# Patient Record
Sex: Female | Born: 1960 | Race: White | Hispanic: No | Marital: Married | Smoking: Never smoker
Health system: Southern US, Community
[De-identification: ages and names within clinical notes are randomized; demographics above are authoritative.]

## PROBLEM LIST (undated history)

## (undated) ENCOUNTER — Emergency Department (HOSPITAL_COMMUNITY): Payer: 59

## (undated) DIAGNOSIS — M797 Fibromyalgia: Secondary | ICD-10-CM

## (undated) DIAGNOSIS — K7460 Unspecified cirrhosis of liver: Secondary | ICD-10-CM

## (undated) DIAGNOSIS — N938 Other specified abnormal uterine and vaginal bleeding: Secondary | ICD-10-CM

## (undated) DIAGNOSIS — I7300 Raynaud's syndrome without gangrene: Secondary | ICD-10-CM

## (undated) DIAGNOSIS — H04121 Dry eye syndrome of right lacrimal gland: Secondary | ICD-10-CM

## (undated) DIAGNOSIS — R682 Dry mouth, unspecified: Secondary | ICD-10-CM

## (undated) DIAGNOSIS — M810 Age-related osteoporosis without current pathological fracture: Secondary | ICD-10-CM

## (undated) DIAGNOSIS — R160 Hepatomegaly, not elsewhere classified: Secondary | ICD-10-CM

## (undated) HISTORY — DX: Fibromyalgia: M79.7

## (undated) HISTORY — DX: Other specified abnormal uterine and vaginal bleeding: N93.8

## (undated) HISTORY — DX: Unspecified cirrhosis of liver: K74.60

## (undated) HISTORY — PX: SALPINGECTOMY: SHX328

## (undated) HISTORY — DX: Dry mouth, unspecified: R68.2

## (undated) HISTORY — DX: Hepatomegaly, not elsewhere classified: R16.0

## (undated) HISTORY — DX: Raynaud's syndrome without gangrene: I73.00

## (undated) HISTORY — DX: Dry eye syndrome of right lacrimal gland: H04.121

---
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
## 1997-08-30 ENCOUNTER — Other Ambulatory Visit: Admission: RE | Admit: 1997-08-30 | Discharge: 1997-08-30 | Payer: Self-pay | Admitting: Gynecology

## 1998-08-28 ENCOUNTER — Other Ambulatory Visit: Admission: RE | Admit: 1998-08-28 | Discharge: 1998-08-28 | Payer: Self-pay | Admitting: Gynecology

## 1999-09-23 ENCOUNTER — Other Ambulatory Visit: Admission: RE | Admit: 1999-09-23 | Discharge: 1999-09-23 | Payer: Self-pay | Admitting: Gynecology

## 2000-05-06 ENCOUNTER — Other Ambulatory Visit: Admission: RE | Admit: 2000-05-06 | Discharge: 2000-05-06 | Payer: Self-pay | Admitting: Gynecology

## 2000-09-23 ENCOUNTER — Other Ambulatory Visit: Admission: RE | Admit: 2000-09-23 | Discharge: 2000-09-23 | Payer: Self-pay | Admitting: Gynecology

## 2001-09-22 ENCOUNTER — Other Ambulatory Visit: Admission: RE | Admit: 2001-09-22 | Discharge: 2001-09-22 | Payer: Self-pay | Admitting: Gynecology

## 2002-02-06 ENCOUNTER — Encounter
Admission: RE | Admit: 2002-02-06 | Discharge: 2002-04-05 | Payer: Self-pay | Admitting: Physical Medicine & Rehabilitation

## 2002-04-06 ENCOUNTER — Encounter
Admission: RE | Admit: 2002-04-06 | Discharge: 2002-05-16 | Payer: Self-pay | Admitting: Physical Medicine & Rehabilitation

## 2002-09-07 ENCOUNTER — Other Ambulatory Visit: Admission: RE | Admit: 2002-09-07 | Discharge: 2002-09-07 | Payer: Self-pay | Admitting: Gynecology

## 2002-10-23 ENCOUNTER — Encounter
Admission: RE | Admit: 2002-10-23 | Discharge: 2003-01-21 | Payer: Self-pay | Admitting: Physical Medicine & Rehabilitation

## 2003-09-20 ENCOUNTER — Other Ambulatory Visit: Admission: RE | Admit: 2003-09-20 | Discharge: 2003-09-20 | Payer: Self-pay | Admitting: Gynecology

## 2004-09-17 ENCOUNTER — Other Ambulatory Visit: Admission: RE | Admit: 2004-09-17 | Discharge: 2004-09-17 | Payer: Self-pay | Admitting: Gynecology

## 2005-09-21 ENCOUNTER — Other Ambulatory Visit: Admission: RE | Admit: 2005-09-21 | Discharge: 2005-09-21 | Payer: Self-pay | Admitting: Gynecology

## 2006-09-30 ENCOUNTER — Other Ambulatory Visit: Admission: RE | Admit: 2006-09-30 | Discharge: 2006-09-30 | Payer: Self-pay | Admitting: Gynecology

## 2006-10-20 ENCOUNTER — Encounter: Admission: RE | Admit: 2006-10-20 | Discharge: 2006-10-20 | Payer: Self-pay | Admitting: Gynecology

## 2007-09-21 ENCOUNTER — Other Ambulatory Visit: Admission: RE | Admit: 2007-09-21 | Discharge: 2007-09-21 | Payer: Self-pay | Admitting: Gynecology

## 2011-09-06 ENCOUNTER — Other Ambulatory Visit: Payer: Self-pay | Admitting: Obstetrics and Gynecology

## 2011-09-08 NOTE — Telephone Encounter (Signed)
Camila refilled 1 pk with 1 rf.  AEX sch for July.  ld

## 2011-09-10 ENCOUNTER — Ambulatory Visit (INDEPENDENT_AMBULATORY_CARE_PROVIDER_SITE_OTHER): Payer: 59 | Admitting: Family Medicine

## 2011-09-10 ENCOUNTER — Encounter: Payer: Self-pay | Admitting: Family Medicine

## 2011-09-10 VITALS — BP 115/75 | HR 68 | Temp 98.0°F | Resp 16 | Ht 66.0 in | Wt 147.6 lb

## 2011-09-10 DIAGNOSIS — M19041 Primary osteoarthritis, right hand: Secondary | ICD-10-CM | POA: Insufficient documentation

## 2011-09-10 DIAGNOSIS — M797 Fibromyalgia: Secondary | ICD-10-CM | POA: Insufficient documentation

## 2011-09-10 DIAGNOSIS — M419 Scoliosis, unspecified: Secondary | ICD-10-CM | POA: Insufficient documentation

## 2011-09-10 DIAGNOSIS — J309 Allergic rhinitis, unspecified: Secondary | ICD-10-CM

## 2011-09-10 MED ORDER — FLUTICASONE FUROATE 27.5 MCG/SPRAY NA SUSP: 2.0000 | Freq: Every day | NASAL | Status: DC

## 2011-09-10 NOTE — Patient Instructions (Signed)
Allergic Rhinitis  Allergic rhinitis is when the mucous membranes in the nose respond to allergens. Allergens are particles in the air that cause your body to have an allergic reaction. This causes you to release allergic antibodies. Through a chain of events, these eventually cause you to release histamine into the blood stream (hence the use of antihistamines). Although meant to be protective to the body, it is this release that causes your discomfort, such as frequent sneezing, congestion and an itchy runny nose.    CAUSES    The pollen allergens may come from grasses, trees, and weeds. This is seasonal allergic rhinitis, or "hay fever." Other allergens cause year-round allergic rhinitis (perennial allergic rhinitis) such as house dust mite allergen, pet dander and mold spores.    SYMPTOMS     Nasal stuffiness (congestion).   Runny, itchy nose with sneezing and tearing of the eyes.   There is often an itching of the mouth, eyes and ears.  It cannot be cured, but it can be controlled with medications.  DIAGNOSIS    If you are unable to determine the offending allergen, skin or blood testing may find it.  TREATMENT     Avoid the allergen.   Medications and allergy shots (immunotherapy) can help.   Hay fever may often be treated with antihistamines in pill or nasal spray forms. Antihistamines block the effects of histamine. There are over-the-counter medicines that may help with nasal congestion and swelling around the eyes. Check with your caregiver before taking or giving this medicine.  If the treatment above does not work, there are many new medications your caregiver can prescribe. Stronger medications may be used if initial measures are ineffective. Desensitizing injections can be used if medications and avoidance fails. Desensitization is when a patient is given ongoing shots until the body becomes less sensitive to the allergen. Make sure you follow up with your caregiver if problems continue.  SEEK  MEDICAL CARE IF:     You develop fever (more than 100.5 F (38.1 C).   You develop a cough that does not stop easily (persistent).   You have shortness of breath.   You start wheezing.   Symptoms interfere with normal daily activities.  Document Released: 12/16/2000 Document Revised: 03/12/2011 Document Reviewed: 06/27/2008  [employer] Patient Information 2012 [employer].

## 2011-09-10 NOTE — Progress Notes (Signed)
  Subjective:    Patient ID: Alexis Hicks, female    DOB: [birth year], 51 y.o.   MRN: [id number]  HPI  This 51 y.o. Cauc female has Allergic rhinitis and was started on Fluticasone NS April 2012 with  good results. She has chronic rhinorrhea and voice change due to post-nasal drip. She suspects she has  allergies to mold (they live in an older home), dust and tree pollen. Air purifier helps in the home. She does  not take OTC antihistamine.She is using several OTC supplements that are extremely effective for   inflammatory- based problems including musculoskeletal complaints.     Review of Systems  Constitutional: Negative for fever, fatigue and unexpected weight change.  HENT: Positive for rhinorrhea, voice change and postnasal drip. Negative for congestion, sore throat, sneezing, trouble swallowing and sinus pressure.   Eyes: Negative.   Respiratory: Negative.   Musculoskeletal: Positive for back pain and arthralgias.  Skin: Negative.   Neurological: Negative.        Objective:   Physical Exam  Nursing note and vitals reviewed. Constitutional: She is oriented to person, place, and time. She appears well-developed and well-nourished. No distress.  HENT:  Head: Normocephalic and atraumatic.  Right Ear: External ear normal.  Left Ear: External ear normal.  Nose: Nose normal.  Mouth/Throat: Oropharynx is clear and moist.       Post pharynx with cobblestoning  Eyes: Conjunctivae and EOM are normal. No scleral icterus.  Neck: Normal range of motion. Neck supple. No thyromegaly present.  Cardiovascular: Normal rate and regular rhythm.   Pulmonary/Chest: Effort normal and breath sounds normal. No respiratory distress.  Lymphadenopathy:    She has no cervical adenopathy.  Neurological: She is alert and oriented to person, place, and time. No cranial nerve deficit.  Skin: Skin is warm and dry. No rash noted.          Assessment & Plan:   1. Allergic rhinitis   Resume  Fluticasone NS  2 spr each nostril hs for 2-4 weeks (or until symptoms are controlled ) then can reduce dose to 1 spr each nostril hs

## 2011-09-30 ENCOUNTER — Telehealth: Payer: Self-pay

## 2011-09-30 MED ORDER — FLUTICASONE PROPIONATE 50 MCG/ACT NA SUSP: 2.0000 | Freq: Every day | NASAL | Status: DC

## 2011-09-30 NOTE — Telephone Encounter (Signed)
Refill done and sent in. 

## 2011-09-30 NOTE — Telephone Encounter (Signed)
The patient called regarding her allergy nose spray.  The patient states she was Rx'd a nose spray that was $149.00 per month and that is not financially possible for the patient.  The patient would like the Rx changed to Fluticasone Propinate spray which she can afford.  The patient states that she and the pharmacy have tried to get this resolved for the past week and she really needs the Rx.  Please call the patient at [phone] (work) or after 3 pm at home at [phone].

## 2011-09-30 NOTE — Telephone Encounter (Signed)
See pended med--ok to rx?

## 2011-10-01 NOTE — Telephone Encounter (Signed)
LMOM that rx was sent to pharmacy 

## 2011-10-14 ENCOUNTER — Ambulatory Visit: Payer: No Typology Code available for payment source | Admitting: Obstetrics and Gynecology

## 2011-11-04 ENCOUNTER — Other Ambulatory Visit: Payer: Self-pay | Admitting: Obstetrics and Gynecology

## 2011-11-05 NOTE — Telephone Encounter (Signed)
Alexis Hicks refilled x 1.  Pt has AEX appt on 11-18-11.  ld

## 2011-11-05 NOTE — Telephone Encounter (Signed)
sr pt 

## 2011-11-11 DIAGNOSIS — N938 Other specified abnormal uterine and vaginal bleeding: Secondary | ICD-10-CM | POA: Insufficient documentation

## 2011-11-18 ENCOUNTER — Ambulatory Visit (INDEPENDENT_AMBULATORY_CARE_PROVIDER_SITE_OTHER): Payer: 59 | Admitting: Obstetrics and Gynecology

## 2011-11-18 ENCOUNTER — Encounter: Payer: Self-pay | Admitting: Obstetrics and Gynecology

## 2011-11-18 VITALS — BP 112/64 | Ht 65.0 in | Wt 150.0 lb

## 2011-11-18 DIAGNOSIS — Z01419 Encounter for gynecological examination (general) (routine) without abnormal findings: Secondary | ICD-10-CM

## 2011-11-18 DIAGNOSIS — N951 Menopausal and female climacteric states: Secondary | ICD-10-CM

## 2011-11-18 NOTE — Progress Notes (Signed)
The patient is taking micronor The patient  is not taking a Calcium supplement. Post-menopausal bleeding:pt stated that she had spotting in may 2013  Last Pap: was normal June  2012 Last mammogram: was normal June  2013 Last DEXA scan :  Last colonoscopy:normal 3 years ago per pt   Urinary symptoms: none Normal bowel movements: Yes Reports abuse at home: no   Subjective:    Alexis Hicks is a 51 y.o. female G3P2 who presents for annual exam.  The patient has no complaints today. Amenorrhea x 1 year with daily hot flashes  The following portions of the patient's history were reviewed and updated as appropriate: allergies, current medications, past family history, past medical history, past social history, past surgical history and problem list.  Review of Systems Pertinent items are noted in HPI. Gastrointestinal:No change in bowel habits, no abdominal pain, no rectal bleeding Genitourinary:negative for dysuria, frequency, hematuria, nocturia and urinary incontinence    Objective:     BP 112/64  Ht 5\' 5"  (1.651 m)  Wt 150 lb (68.04 kg)  BMI 24.96 kg/m2  Weight:  Wt Readings from Last 1 Encounters:  11/18/11 150 lb (68.04 kg)     BMI: Body mass index is 24.96 kg/(m^2). General Appearance: Alert, appropriate appearance for age. No acute distress HEENT: Grossly normal Neck / Thyroid: Supple, no masses, nodes or enlargement Lungs: clear to auscultation bilaterally Back: No CVA tenderness Breast Exam: No masses or nodes.No dimpling, nipple retraction or discharge. Cardiovascular: Regular rate and rhythm. S1, S2, no murmur Gastrointestinal: Soft, non-tender, no masses or organomegaly Pelvic Exam: Vulva and vagina appear normal. Bimanual exam reveals normal uterus and adnexa. Rectovaginal: normal rectal, no masses Lymphatic Exam: Non-palpable nodes in neck, clavicular, axillary, or inguinal regions Skin: no rash or abnormalities Neurologic: Normal gait and speech, no  tremor  Psychiatric: Alert and oriented, appropriate affect.   Assessment:    Normal gyn exam Menopause?    Plan:    Pap, FSH: if elevated, will stop Micronor  Reviewed HRT with R&B pt to decide: would start with Estradiol and Prometrium Follow-up:  for annual exam

## 2011-11-19 ENCOUNTER — Telehealth: Payer: Self-pay

## 2011-11-19 LAB — FOLLICLE STIMULATING HORMONE: FSH: 93.8 m[IU]/mL

## 2011-11-19 NOTE — Progress Notes (Signed)
Quick Note:  Please call pt and inform that result is confirming menopause. OK to stop Micronor  ______

## 2011-11-19 NOTE — Telephone Encounter (Signed)
Pt notified of labs and instructions from SR.  ld

## 2011-11-19 NOTE — Telephone Encounter (Signed)
Message copied by Larwance Rote on Thu Nov 19, 2011  3:40 PM ------      Message from: Alexis Hicks      Created: Thu Nov 19, 2011  7:30 AM       Please call pt and inform that result is confirming menopause. OK to stop Micronor

## 2011-11-20 LAB — PAP IG W/ RFLX HPV ASCU

## 2014-02-05 ENCOUNTER — Encounter: Payer: Self-pay | Admitting: Obstetrics and Gynecology

## 2015-11-25 ENCOUNTER — Ambulatory Visit (INDEPENDENT_AMBULATORY_CARE_PROVIDER_SITE_OTHER): Payer: 59 | Admitting: Urgent Care

## 2015-11-25 VITALS — BP 110/74 | HR 59 | Temp 98.0°F | Resp 18 | Ht 65.0 in | Wt 142.4 lb

## 2015-11-25 DIAGNOSIS — R102 Pelvic and perineal pain: Secondary | ICD-10-CM

## 2015-11-25 DIAGNOSIS — R350 Frequency of micturition: Secondary | ICD-10-CM | POA: Diagnosis not present

## 2015-11-25 DIAGNOSIS — R300 Dysuria: Secondary | ICD-10-CM | POA: Diagnosis not present

## 2015-11-25 LAB — POCT URINALYSIS DIP (MANUAL ENTRY)
BILIRUBIN UA: NEGATIVE
BILIRUBIN UA: NEGATIVE
Glucose, UA: NEGATIVE
LEUKOCYTES UA: NEGATIVE
Nitrite, UA: NEGATIVE
PH UA: 7
PROTEIN UA: NEGATIVE
RBC UA: NEGATIVE
SPEC GRAV UA: 1.01
Urobilinogen, UA: 0.2

## 2015-11-25 LAB — POC MICROSCOPIC URINALYSIS (UMFC): Mucus: ABSENT

## 2015-11-25 MED ORDER — NITROFURANTOIN MONOHYD MACRO 100 MG PO CAPS: 100.0000 mg | ORAL_CAPSULE | Freq: Two times a day (BID) | ORAL | 0 refills | Status: DC

## 2015-11-25 MED ORDER — PHENAZOPYRIDINE HCL 95 MG PO TABS: 95.0000 mg | ORAL_TABLET | Freq: Three times a day (TID) | ORAL | 0 refills | Status: DC | PRN

## 2015-11-25 NOTE — Patient Instructions (Signed)

## 2015-11-25 NOTE — Progress Notes (Signed)
    MRN: [id number] DOB: 1961/01/27  Subjective:   Alexis Hicks is a 55 y.o. female presenting for chief complaint of Urinary Tract Infection (increased urinary pressure x1.5 week )  Reports 1.5 week history of dysuria, urinary frequency, pelvic pain and feeling of incomplete emptying. Has only had 1 UTI in her life. Has not tried any medications for relief. Denies fever, hematuria, flank pain, abdominal pain, cloudy malordorous urine, genital rash, genital irritation and vaginal discharge, nausea and vomiting.   Alexis Hicks has a current medication list which includes the following prescription(s): cyclosporine, camila, co-enzyme q-10, fish oil-omega-3 fatty acids, flaxseed oil, glucosamine-chondroitin, vitamin e, and vitamin e. Patient has No Known Allergies.  Alexis Hicks  has a past medical history of DUB (dysfunctional uterine bleeding). Also  has a past surgical history that includes Salpingectomy.  Objective:   Vitals: BP 110/74   Pulse (!) 59   Temp 98 F (36.7 C) (Oral)   Resp 18   Ht 5\' 5"  (1.651 m)   Wt 142 lb 6.4 oz (64.6 kg)   SpO2 99%   BMI 23.70 kg/m   Physical Exam  Constitutional: She is oriented to person, place, and time. She appears well-developed and well-nourished.  HENT:  Mouth/Throat: Oropharynx is clear and moist.  Cardiovascular: Normal rate, regular rhythm and intact distal pulses.  Exam reveals no gallop and no friction rub.   No murmur heard. Pulmonary/Chest: No respiratory distress. She has no wheezes. She has no rales.  Abdominal: Soft. Bowel sounds are normal. She exhibits no distension and no mass. There is no tenderness.  No CVA tenderness.  Neurological: She is alert and oriented to person, place, and time.  Skin: Skin is warm and dry.   Results for orders placed or performed in visit on 11/25/15 (from the past 24 hour(s))  POCT urinalysis dipstick     Status: None   Collection Time: 11/25/15  3:17 PM  Result Value Ref Range   Color, UA  yellow yellow   Clarity, UA clear clear   Glucose, UA negative negative   Bilirubin, UA negative negative   Ketones, POC UA negative negative   Spec Grav, UA 1.010    Blood, UA negative negative   pH, UA 7.0    Protein Ur, POC negative negative   Urobilinogen, UA 0.2    Nitrite, UA Negative Negative   Leukocytes, UA Negative Negative  POCT Microscopic Urinalysis ([facility])     Status: None   Collection Time: 11/25/15  3:18 PM  Result Value Ref Range   WBC,UR,HPF,POC None None WBC/hpf   RBC,UR,HPF,POC None None RBC/hpf   Bacteria None None, Too numerous to count   Mucus Absent Absent   Epithelial Cells, UR Per Microscopy None None, Too numerous to count cells/hpf   Assessment and Plan :   1. Dysuria 2. Frequent urination 3. Pelvic pain in female - Will treat for cystitis given history but discussed differential with patient including STI, bladder spasms, weakened pelvic floor. For now, we will use Macrobid, Azo. Consider recheck with OB/GYN, STI testing if no improvement by Friday. Patient agreed with plan.  Jaynee Eagles, PA-C [facility] [phone] 11/25/2015 2:58 PM

## 2015-11-26 LAB — URINE CULTURE

## 2015-11-27 ENCOUNTER — Encounter: Payer: Self-pay | Admitting: Urgent Care

## 2016-02-17 DIAGNOSIS — I7300 Raynaud's syndrome without gangrene: Secondary | ICD-10-CM | POA: Insufficient documentation

## 2016-02-17 DIAGNOSIS — M3500 Sicca syndrome, unspecified: Secondary | ICD-10-CM | POA: Insufficient documentation

## 2016-02-17 DIAGNOSIS — M47816 Spondylosis without myelopathy or radiculopathy, lumbar region: Secondary | ICD-10-CM | POA: Insufficient documentation

## 2016-02-17 DIAGNOSIS — M19072 Primary osteoarthritis, left ankle and foot: Secondary | ICD-10-CM

## 2016-02-17 DIAGNOSIS — R5383 Other fatigue: Secondary | ICD-10-CM | POA: Insufficient documentation

## 2016-02-17 DIAGNOSIS — M47812 Spondylosis without myelopathy or radiculopathy, cervical region: Secondary | ICD-10-CM | POA: Insufficient documentation

## 2016-02-17 DIAGNOSIS — M19071 Primary osteoarthritis, right ankle and foot: Secondary | ICD-10-CM | POA: Insufficient documentation

## 2016-02-17 DIAGNOSIS — G43909 Migraine, unspecified, not intractable, without status migrainosus: Secondary | ICD-10-CM | POA: Insufficient documentation

## 2016-02-17 DIAGNOSIS — R29898 Other symptoms and signs involving the musculoskeletal system: Secondary | ICD-10-CM | POA: Insufficient documentation

## 2016-02-19 ENCOUNTER — Encounter: Payer: Self-pay | Admitting: *Deleted

## 2016-02-20 ENCOUNTER — Ambulatory Visit (INDEPENDENT_AMBULATORY_CARE_PROVIDER_SITE_OTHER): Payer: 59 | Admitting: Rheumatology

## 2016-02-20 ENCOUNTER — Encounter: Payer: Self-pay | Admitting: Rheumatology

## 2016-02-20 VITALS — BP 129/69 | HR 63 | Resp 14 | Ht 65.0 in | Wt 145.0 lb

## 2016-02-20 DIAGNOSIS — M19042 Primary osteoarthritis, left hand: Secondary | ICD-10-CM

## 2016-02-20 DIAGNOSIS — M19071 Primary osteoarthritis, right ankle and foot: Secondary | ICD-10-CM

## 2016-02-20 DIAGNOSIS — R29898 Other symptoms and signs involving the musculoskeletal system: Secondary | ICD-10-CM | POA: Diagnosis not present

## 2016-02-20 DIAGNOSIS — M3500 Sicca syndrome, unspecified: Secondary | ICD-10-CM | POA: Diagnosis not present

## 2016-02-20 DIAGNOSIS — M19041 Primary osteoarthritis, right hand: Secondary | ICD-10-CM

## 2016-02-20 DIAGNOSIS — M797 Fibromyalgia: Secondary | ICD-10-CM | POA: Diagnosis not present

## 2016-02-20 DIAGNOSIS — I7300 Raynaud's syndrome without gangrene: Secondary | ICD-10-CM

## 2016-02-20 DIAGNOSIS — M47816 Spondylosis without myelopathy or radiculopathy, lumbar region: Secondary | ICD-10-CM | POA: Diagnosis not present

## 2016-02-20 DIAGNOSIS — G43909 Migraine, unspecified, not intractable, without status migrainosus: Secondary | ICD-10-CM | POA: Diagnosis not present

## 2016-02-20 DIAGNOSIS — M5030 Other cervical disc degeneration, unspecified cervical region: Secondary | ICD-10-CM

## 2016-02-20 DIAGNOSIS — M47812 Spondylosis without myelopathy or radiculopathy, cervical region: Secondary | ICD-10-CM

## 2016-02-20 DIAGNOSIS — M19072 Primary osteoarthritis, left ankle and foot: Secondary | ICD-10-CM

## 2016-02-20 DIAGNOSIS — R5383 Other fatigue: Secondary | ICD-10-CM

## 2016-02-20 NOTE — Progress Notes (Signed)
Office Visit Note  Patient: Alexis Hicks             Date of Birth: 1960-11-13           MRN: [id number]             PCP: Rivard,Sandra,M.D. Referring: No ref. provider found Visit Date: 02/20/2016 Occupation: Health educator    Subjective:  Bilateral thumb pain   History of Present Illness: Alexis Hicks is a 55 y.o. female with history of fibromyalgia and osteoarthritis. She states she's been having some discomfort in her bilateral CMC joints. She's using [place] brace is now which is been helpful. She states her fibromyalgia is not very active. Her pain on scale of 0-10 has been about 0-2 and her fatigue on 0-10 has been about 0-5. She has occasional discomfort in her feet. She describes some stiffness in her C-spine for which she's been doing some stretching exercises. She's been going to physical therapy since June of this year for right lower extremity muscle weakness area and she has noticed some improvement in the range of motion of her right ankle joint.   Activities of Daily Living:  Patient reports morning stiffness for5 minutes.   Patient Denies nocturnal pain.  Difficulty dressing/grooming: Denies Difficulty climbing stairs: Denies Difficulty getting out of chair: Denies Difficulty using hands for taps, buttons, cutlery, and/or writing: Reports   Review of Systems  Constitutional: Positive for fatigue. Negative for night sweats, weight gain, weight loss and weakness.  HENT: Positive for mouth dryness. Negative for mouth sores, trouble swallowing, trouble swallowing and nose dryness.   Eyes: Positive for dryness. Negative for pain, redness and visual disturbance.  Respiratory: Negative for cough, shortness of breath and difficulty breathing.   Cardiovascular: Negative for chest pain, palpitations, hypertension, irregular heartbeat and swelling in legs/feet.  Gastrointestinal: Negative for blood in stool, constipation and diarrhea.  Endocrine: Negative for  increased urination.  Genitourinary: Negative for vaginal dryness.  Musculoskeletal: Positive for arthralgias, joint pain, myalgias and myalgias. Negative for joint swelling, muscle weakness, morning stiffness and muscle tenderness.  Skin: Negative for color change, rash, hair loss, skin tightness, ulcers and sensitivity to sunlight.  Allergic/Immunologic: Negative for susceptible to infections.  Neurological: Negative for dizziness, memory loss and night sweats.  Hematological: Negative for swollen glands.  Psychiatric/Behavioral: Positive for depressed mood. Negative for sleep disturbance. The patient is nervous/anxious.     PMFS History:  Patient Active Problem List   Diagnosis Date Noted  . Fatigue 02/17/2016  . Primary osteoarthritis of both feet 02/17/2016  . 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 Osteoarthritis of lumbar spine 02/17/2016  . Weakness of right lower extremity 02/17/2016  . Migraine without status migrainosus, not intractable 02/17/2016  . Raynaud's  02/17/2016  . Sicca syndrome 02/17/2016  . DUB (dysfunctional uterine bleeding)   . Fibromyalgia syndrome 09/10/2011  . Primary osteoarthritis of both hands 09/10/2011  . Scoliosis 09/10/2011    Past Medical History:  Diagnosis Date  . DUB (dysfunctional uterine bleeding)   . Fibromyalgia     Family History  Problem Relation Age of Onset  . Breast cancer Paternal Aunt   . Breast cancer Paternal Grandmother   . Arthritis Mother   . Osteoporosis Mother   . Skin cancer Father   . Depression Maternal Grandmother   . Hypertension Maternal Grandmother   . Stroke Maternal Grandmother   . Kidney disease Maternal Grandfather   . Uterine cancer Maternal Aunt    Past Surgical History:  Procedure  Laterality Date  . SALPINGECTOMY     Social History   Social History Narrative  . No narrative on file     Objective: Vital Signs: BP 129/69 (BP Location: Left Arm, Patient Position: Sitting, Cuff Size: Large)   Pulse 63    Resp 14   Ht 5\' 5"  (1.651 m)   Wt 145 lb (65.8 kg)   BMI 24.13 kg/m    Physical Exam  Constitutional: She is oriented to person, place, and time. She appears well-developed and well-nourished.  HENT:  Head: Normocephalic and atraumatic.  Eyes: Conjunctivae and EOM are normal.  Neck: Normal range of motion.  Cardiovascular: Normal rate, regular rhythm, normal heart sounds and intact distal pulses.   Pulmonary/Chest: Effort normal and breath sounds normal.  Abdominal: Soft. Bowel sounds are normal.  Lymphadenopathy:    She has no cervical adenopathy.  Neurological: She is alert and oriented to person, place, and time.  Skin: Skin is warm and dry. Capillary refill takes less than 2 seconds.  Psychiatric: She has a normal mood and affect. Her behavior is normal.  Nursing note and vitals reviewed.    Musculoskeletal Exam: C-spine good range of motion with some stiffness. Thoracic spine kyphotic posture, lumbar spine good range of motion with no discomfort. Bilateral shoulder joints elbow joints, wrist joints, MCP joints good range of motion she has some thickening of bilateral CMC joints and prominence of DIP joints right first PIP joint with some thickening. Hip joints, knee joints, ankle joints, MTPs PIPs with good range of motion with no swelling or synovitis.  CDAI Exam: No CDAI exam completed.    Investigation: No additional findings.   Imaging: No results found.  Speciality Comments: No specialty comments available.    Procedures:  No procedures performed Allergies: Patient has no known allergies.   Assessment / Plan:     Visit Diagnoses: Fibromyalgia syndrome: She had mild generalized discomfort and few tender points.  Fatigue,: Is a still an issue off and on.  Primary osteoarthritis of both hands: She has discomfort in her DIP joints and CMC joints. We had detailed discussion regarding joint protection and muscle strengthening exercises were demonstrated in the  office today.  Primary osteoarthritis of both feet: Proper fitting shoes were discussed.  DDD Cspine: C-spine exercises were discussed.  Thoracic kyphosis: Posture correction exercises were demonstrated.  DDDLspine: She's not having much discomfort currently.  Weakness of right lower extremity - Atrophy of right hemipelvic musculature: Her muscle strengthening was improving with exercises.  Migraines  Raynaud's   Sicca syndrome : She is using over-the-counter medications which were reviewed.  She will get bone density through her GYN.     Face-to-face time spent with patient was 30 minutes. 50% of time was spent in counseling and coordination of care.  Follow-Up Instructions: Return in about 6 months (around 08/19/2016) for Osteoarthritis.   Bo Merino, MD

## 2016-04-15 DIAGNOSIS — Z124 Encounter for screening for malignant neoplasm of cervix: Secondary | ICD-10-CM | POA: Diagnosis not present

## 2016-04-15 DIAGNOSIS — Z6823 Body mass index (BMI) 23.0-23.9, adult: Secondary | ICD-10-CM | POA: Diagnosis not present

## 2016-04-15 DIAGNOSIS — Z01419 Encounter for gynecological examination (general) (routine) without abnormal findings: Secondary | ICD-10-CM | POA: Diagnosis not present

## 2016-04-30 DIAGNOSIS — M25571 Pain in right ankle and joints of right foot: Secondary | ICD-10-CM | POA: Diagnosis not present

## 2016-04-30 DIAGNOSIS — S8411XD Injury of peroneal nerve at lower leg level, right leg, subsequent encounter: Secondary | ICD-10-CM | POA: Diagnosis not present

## 2016-06-09 DIAGNOSIS — D1801 Hemangioma of skin and subcutaneous tissue: Secondary | ICD-10-CM | POA: Diagnosis not present

## 2016-06-09 DIAGNOSIS — L814 Other melanin hyperpigmentation: Secondary | ICD-10-CM | POA: Diagnosis not present

## 2016-06-09 DIAGNOSIS — L821 Other seborrheic keratosis: Secondary | ICD-10-CM | POA: Diagnosis not present

## 2016-08-19 NOTE — Progress Notes (Signed)
Office Visit Note  Patient: Alexis Hicks             Date of Birth: November 02, 1960           MRN: [id number]             PCP: Delsa Bern, MD Referring: No ref. provider found Visit Date: 08/25/2016 Occupation: _0 @    Subjective:  Muscle Pain   History of Present Illness: Alexis Hicks is a 56 y.o. female with history of fibromyalgia syndrome, disc disease and osteoarthritis. She states she's been having increased pain and stiffness in her C-spine and having some spasm in the right trapezius area. She continues to have some discomfort in her bilateral comp and bilateral knee joints. She denies any joint swelling. She's been using Restasis for her right eye dryness.  Activities of Daily Living:  Patient reports morning stiffness for 10 minutes.   Patient Denies nocturnal pain.  Difficulty dressing/grooming: Denies Difficulty climbing stairs: Denies Difficulty getting out of chair: Reports Difficulty using hands for taps, buttons, cutlery, and/or writing: Denies   Review of Systems  Constitutional: Positive for fatigue. Negative for night sweats, weight gain, weight loss and weakness.  HENT: Negative for mouth sores, trouble swallowing, trouble swallowing, mouth dryness and nose dryness.   Eyes: Positive for dryness. Negative for pain, redness and visual disturbance.  Respiratory: Negative for cough, shortness of breath and difficulty breathing.   Cardiovascular: Negative for chest pain, palpitations, hypertension, irregular heartbeat and swelling in legs/feet.  Gastrointestinal: Negative for blood in stool, constipation and diarrhea.  Endocrine: Negative for increased urination.  Genitourinary: Negative for vaginal dryness.  Musculoskeletal: Positive for arthralgias, joint pain, myalgias and myalgias. Negative for joint swelling, muscle weakness, morning stiffness and muscle tenderness.  Skin: Negative for color change, rash, hair loss, skin tightness, ulcers  and sensitivity to sunlight.  Allergic/Immunologic: Negative for susceptible to infections.  Neurological: Negative for dizziness, memory loss and night sweats.  Hematological: Negative for swollen glands.  Psychiatric/Behavioral: Negative for depressed mood and sleep disturbance. The patient is not nervous/anxious.     PMFS History:  Patient Active Problem List   Diagnosis Date Noted  . Osteopenia of multiple sites 08/25/2016  . Vitamin D deficiency 08/25/2016  . Fatigue 02/17/2016  . Primary osteoarthritis of both feet 02/17/2016  . 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 Osteoarthritis of lumbar spine 02/17/2016  . Weakness of right lower extremity 02/17/2016  . Migraine without status migrainosus, not intractable 02/17/2016  . Raynaud's  02/17/2016  . Sicca syndrome 02/17/2016  . DUB (dysfunctional uterine bleeding)   . Fibromyalgia syndrome 09/10/2011  . Primary osteoarthritis of both hands 09/10/2011  . Scoliosis 09/10/2011    Past Medical History:  Diagnosis Date  . Chronically dry eyes, right   . Dry mouth   . DUB (dysfunctional uterine bleeding)   . Fibromyalgia   . Raynaud's syndrome     Family History  Problem Relation Age of Onset  . Breast cancer Paternal Aunt   . Breast cancer Paternal Grandmother   . Arthritis Mother   . Osteoporosis Mother   . Skin cancer Father   . Depression Maternal Grandmother   . Hypertension Maternal Grandmother   . Stroke Maternal Grandmother   . Kidney disease Maternal Grandfather   . Uterine cancer Maternal Aunt   . Metabolic syndrome Brother   . Diabetes Brother   . Hypertension Brother   . High Cholesterol Brother    Past Surgical History:  Procedure Laterality Date  .  SALPINGECTOMY     Social History   Social History Narrative  . No narrative on file     Objective: Vital Signs: BP (!) 93/58 (BP Location: Left Arm, Patient Position: Sitting, Cuff Size: Normal)   Pulse 66   Resp 14   Ht _0  (1.651 m)   Wt 146 lb (66.2 kg)    BMI 24.30 kg/m    Physical Exam  Constitutional: She is oriented to person, place, and time. She appears well-developed and well-nourished.  HENT:  Head: Normocephalic and atraumatic.  Eyes: Conjunctivae and EOM are normal.  Neck: Normal range of motion.  Cardiovascular: Normal rate, regular rhythm, normal heart sounds and intact distal pulses.   Pulmonary/Chest: Effort normal and breath sounds normal.  Abdominal: Soft. Bowel sounds are normal.  Lymphadenopathy:    She has no cervical adenopathy.  Neurological: She is alert and oriented to person, place, and time.  Skin: Skin is warm and dry. Capillary refill takes less than 2 seconds.  Psychiatric: She has a normal mood and affect. Her behavior is normal.  Nursing note and vitals reviewed.    Musculoskeletal Exam: C-spine and thoracic lumbar spine good range of motion she has a stiffness with range of motion of her C-spine she has mild right trapezius spasm. Shoulder joints elbow joints wrist joint MCPs PIPs with good range of motion with no synovitis. Hip joints knee joints ankles MTPs PIPs with good range of motion with no synovitis. Fibromyalgia tender points were 8 out of 18 positive.  CDAI Exam: No CDAI exam completed.    Investigation: Findings:  On October 29, 2008, comprehensive metabolic panel, uric acid, CK, ACE level, anti-CCP, HLAB-27, and vitamin D were all within normal limits.       Imaging: No results found.  Speciality Comments: No specialty comments available.    Procedures:  No procedures performed Allergies: Patient has no known allergies.   Assessment / Plan:     Visit Diagnoses: Fibromyalgia syndrome: She has some generalized pain and positive tender points.  Other fatigue: She continues to have some fatigue  DDD Cspine: She's been having increased stiffness and pain in her C-spine. The discussed physical therapy. I'll make a referral for her. Some C-spine exercises were demonstrated in the  office today.  Other form of scoliosis of thoracolumbar spine: Chronic pain  Spondylosis of lumbar region without myelopathy or radiculopathy: She is chronic discomfort  Primary osteoarthritis of both hands: Joint protection and muscle strengthening was discussed.  Primary osteoarthritis of both feet: Proper fitting shoes were discussed.  Osteopenia of multiple sites: According to patient's recent diagnosis. Calcium and vitamin D and resistive exercises were discussed.  Sicca syndrome: She is doing well on Restasis.  Vitamin D deficiency: She is on supplement.  History of migraine    Orders: Orders Placed This Encounter  Procedures  . Ambulatory referral to Physical Therapy   No orders of the defined types were placed in this encounter.   Face-to-face time spent with patient was 30 minutes. 50% of time was spent in counseling and coordination of care.  Follow-Up Instructions: Return in about 6 months (around 02/25/2017) for Osteoarthritis, DDD , osteopenia.   Bo Merino, MD  Note - This record has been created using Editor, commissioning.  Chart creation errors have been sought, but may not always  have been located. Such creation errors do not reflect on  the standard of medical care.

## 2016-08-20 ENCOUNTER — Ambulatory Visit: Payer: 59 | Admitting: Rheumatology

## 2016-08-25 ENCOUNTER — Ambulatory Visit (INDEPENDENT_AMBULATORY_CARE_PROVIDER_SITE_OTHER): Payer: 59 | Admitting: Rheumatology

## 2016-08-25 ENCOUNTER — Encounter: Payer: Self-pay | Admitting: Rheumatology

## 2016-08-25 VITALS — BP 93/58 | HR 66 | Resp 14 | Ht 65.0 in | Wt 146.0 lb

## 2016-08-25 DIAGNOSIS — Z8669 Personal history of other diseases of the nervous system and sense organs: Secondary | ICD-10-CM | POA: Diagnosis not present

## 2016-08-25 DIAGNOSIS — M19042 Primary osteoarthritis, left hand: Secondary | ICD-10-CM | POA: Diagnosis not present

## 2016-08-25 DIAGNOSIS — E559 Vitamin D deficiency, unspecified: Secondary | ICD-10-CM | POA: Diagnosis not present

## 2016-08-25 DIAGNOSIS — M19041 Primary osteoarthritis, right hand: Secondary | ICD-10-CM

## 2016-08-25 DIAGNOSIS — M8589 Other specified disorders of bone density and structure, multiple sites: Secondary | ICD-10-CM

## 2016-08-25 DIAGNOSIS — M47812 Spondylosis without myelopathy or radiculopathy, cervical region: Secondary | ICD-10-CM

## 2016-08-25 DIAGNOSIS — M797 Fibromyalgia: Secondary | ICD-10-CM

## 2016-08-25 DIAGNOSIS — M19071 Primary osteoarthritis, right ankle and foot: Secondary | ICD-10-CM

## 2016-08-25 DIAGNOSIS — M5030 Other cervical disc degeneration, unspecified cervical region: Secondary | ICD-10-CM | POA: Diagnosis not present

## 2016-08-25 DIAGNOSIS — M4185 Other forms of scoliosis, thoracolumbar region: Secondary | ICD-10-CM | POA: Diagnosis not present

## 2016-08-25 DIAGNOSIS — M47816 Spondylosis without myelopathy or radiculopathy, lumbar region: Secondary | ICD-10-CM

## 2016-08-25 DIAGNOSIS — M3500 Sicca syndrome, unspecified: Secondary | ICD-10-CM | POA: Diagnosis not present

## 2016-08-25 DIAGNOSIS — R5383 Other fatigue: Secondary | ICD-10-CM

## 2016-08-25 DIAGNOSIS — M19072 Primary osteoarthritis, left ankle and foot: Secondary | ICD-10-CM

## 2016-08-25 NOTE — Patient Instructions (Signed)
Cervical Strain and Sprain Rehab Ask your health care provider which exercises are safe for you. Do exercises exactly as told by your health care provider and adjust them as directed. It is normal to feel mild stretching, pulling, tightness, or discomfort as you do these exercises, but you should stop right away if you feel sudden pain or your pain gets worse.Do not begin these exercises until told by your health care provider. Stretching and range of motion exercises These exercises warm up your muscles and joints and improve the movement and flexibility of your neck. These exercises also help to relieve pain, numbness, and tingling. Exercise A: Cervical side bend 1. Using good posture, sit on a stable chair or stand up. 2. Without moving your shoulders, slowly tilt your left / right ear to your shoulder until you feel a stretch in your neck muscles. You should be looking straight ahead. 3. Hold for __________ seconds. 4. Repeat with the other side of your neck. Repeat __________ times. Complete this exercise __________ times a day. Exercise B: Cervical rotation 1. Using good posture, sit on a stable chair or stand up. 2. Slowly turn your head to the side as if you are looking over your left / right shoulder.  Keep your eyes level with the ground.  Stop when you feel a stretch along the side and the back of your neck. 3. Hold for __________ seconds. 4. Repeat this by turning to your other side. Repeat __________ times. Complete this exercise __________ times a day. Exercise C: Thoracic extension and pectoral stretch 1. Roll a towel or a small blanket so it is about 4 inches (10 cm) in diameter. 2. Lie down on your back on a firm surface. 3. Put the towel lengthwise, under your spine in the middle of your back. It should not be not under your shoulder blades. The towel should line up with your spine from your middle back to your lower back. 4. Put your hands behind your head and let your  elbows fall out to your sides. 5. Hold for __________ seconds. Repeat __________ times. Complete this exercise __________ times a day. Strengthening exercises These exercises build strength and endurance in your neck. Endurance is the ability to use your muscles for a long time, even after your muscles get tired. Exercise D: Upper cervical flexion, isometric 1. Lie on your back with a thin pillow behind your head and a small rolled-up towel under your neck. 2. Gently tuck your chin toward your chest and nod your head down to look toward your feet. Do not lift your head off the pillow. 3. Hold for __________ seconds. 4. Release the tension slowly. Relax your neck muscles completely before you repeat this exercise. Repeat __________ times. Complete this exercise __________ times a day. Exercise E: Cervical extension, isometric 1. Stand about 6 inches (15 cm) away from a wall, with your back facing the wall. 2. Place a soft object, about 6-8 inches (15-20 cm) in diameter, between the back of your head and the wall. A soft object could be a small pillow, a ball, or a folded towel. 3. Gently tilt your head back and press into the soft object. Keep your jaw and forehead relaxed. 4. Hold for __________ seconds. 5. Release the tension slowly. Relax your neck muscles completely before you repeat this exercise. Repeat __________ times. Complete this exercise __________ times a day. Posture and body mechanics   Body mechanics refers to the movements and positions of your body   while you do your daily activities. Posture is part of body mechanics. Good posture and healthy body mechanics can help to relieve stress in your body's tissues and joints. Good posture means that your spine is in its natural S-curve position (your spine is neutral), your shoulders are pulled back slightly, and your head is not tipped forward. The following are general guidelines for applying improved posture and body mechanics to your  everyday activities. Standing  When standing, keep your spine neutral and keep your feet about hip-width apart. Keep a slight bend in your knees. Your ears, shoulders, and hips should line up.  When you do a task in which you stand in one place for a long time, place one foot up on a stable object that is 2-4 inches (5-10 cm) high, such as a footstool. This helps keep your spine neutral. Sitting  When sitting, keep your spine neutral and your keep feet flat on the floor. Use a footrest, if necessary, and keep your thighs parallel to the floor. Avoid rounding your shoulders, and avoid tilting your head forward.  When working at a desk or a computer, keep your desk at a height where your hands are slightly lower than your elbows. Slide your chair under your desk so you are close enough to maintain good posture.  When working at a computer, place your monitor at a height where you are looking straight ahead and you do not have to tilt your head forward or downward to look at the screen. Resting When lying down and resting, avoid positions that are most painful for you. Try to support your neck in a neutral position. You can use a contour pillow or a small rolled-up towel. Your pillow should support your neck but not push on it. This information is not intended to replace advice given to you by your health care provider. Make sure you discuss any questions you have with your health care provider. Document Released: 03/23/2005 Document Revised: 11/28/2015 Document Reviewed: 02/27/2015 Elsevier Interactive Patient Education  2017 [employer]  

## 2016-09-14 DIAGNOSIS — M797 Fibromyalgia: Secondary | ICD-10-CM | POA: Diagnosis not present

## 2016-09-14 DIAGNOSIS — M542 Cervicalgia: Secondary | ICD-10-CM | POA: Diagnosis not present

## 2016-09-24 DIAGNOSIS — M542 Cervicalgia: Secondary | ICD-10-CM | POA: Diagnosis not present

## 2016-09-24 DIAGNOSIS — M797 Fibromyalgia: Secondary | ICD-10-CM | POA: Diagnosis not present

## 2016-10-01 DIAGNOSIS — M797 Fibromyalgia: Secondary | ICD-10-CM | POA: Diagnosis not present

## 2016-10-01 DIAGNOSIS — M542 Cervicalgia: Secondary | ICD-10-CM | POA: Diagnosis not present

## 2016-10-14 DIAGNOSIS — M542 Cervicalgia: Secondary | ICD-10-CM | POA: Diagnosis not present

## 2016-10-14 DIAGNOSIS — M797 Fibromyalgia: Secondary | ICD-10-CM | POA: Diagnosis not present

## 2016-10-19 DIAGNOSIS — M542 Cervicalgia: Secondary | ICD-10-CM | POA: Diagnosis not present

## 2016-10-19 DIAGNOSIS — M797 Fibromyalgia: Secondary | ICD-10-CM | POA: Diagnosis not present

## 2016-10-28 DIAGNOSIS — M797 Fibromyalgia: Secondary | ICD-10-CM | POA: Diagnosis not present

## 2016-10-28 DIAGNOSIS — M542 Cervicalgia: Secondary | ICD-10-CM | POA: Diagnosis not present

## 2016-11-04 DIAGNOSIS — M542 Cervicalgia: Secondary | ICD-10-CM | POA: Diagnosis not present

## 2016-11-04 DIAGNOSIS — M797 Fibromyalgia: Secondary | ICD-10-CM | POA: Diagnosis not present

## 2016-11-11 DIAGNOSIS — M797 Fibromyalgia: Secondary | ICD-10-CM | POA: Diagnosis not present

## 2016-11-11 DIAGNOSIS — M542 Cervicalgia: Secondary | ICD-10-CM | POA: Diagnosis not present

## 2016-11-19 DIAGNOSIS — M542 Cervicalgia: Secondary | ICD-10-CM | POA: Diagnosis not present

## 2016-11-19 DIAGNOSIS — M797 Fibromyalgia: Secondary | ICD-10-CM | POA: Diagnosis not present

## 2016-12-09 DIAGNOSIS — M542 Cervicalgia: Secondary | ICD-10-CM | POA: Diagnosis not present

## 2016-12-09 DIAGNOSIS — M797 Fibromyalgia: Secondary | ICD-10-CM | POA: Diagnosis not present

## 2016-12-22 DIAGNOSIS — M797 Fibromyalgia: Secondary | ICD-10-CM | POA: Diagnosis not present

## 2016-12-22 DIAGNOSIS — M542 Cervicalgia: Secondary | ICD-10-CM | POA: Diagnosis not present

## 2017-01-04 DIAGNOSIS — M797 Fibromyalgia: Secondary | ICD-10-CM | POA: Diagnosis not present

## 2017-01-04 DIAGNOSIS — M542 Cervicalgia: Secondary | ICD-10-CM | POA: Diagnosis not present

## 2017-01-20 DIAGNOSIS — M797 Fibromyalgia: Secondary | ICD-10-CM | POA: Diagnosis not present

## 2017-01-20 DIAGNOSIS — M542 Cervicalgia: Secondary | ICD-10-CM | POA: Diagnosis not present

## 2017-02-04 DIAGNOSIS — M542 Cervicalgia: Secondary | ICD-10-CM | POA: Diagnosis not present

## 2017-02-04 DIAGNOSIS — M797 Fibromyalgia: Secondary | ICD-10-CM | POA: Diagnosis not present

## 2017-02-20 NOTE — Progress Notes (Signed)
Office Visit Note  Patient: Alexis Hicks             Date of Birth: Aug 06, 1960           MRN: [id number]             PCP: Delsa Bern, MD Referring: Delsa Bern, MD Visit Date: 03/02/2017 Occupation: @GUAROCC @    Subjective:  Bilateral CMC joint pain   History of Present Illness: Alexis Hicks is a 56 y.o. female with a history of fibromyalgia, sicca symptoms, and osteoarthritis presents today with ongoing bilateral CMC joint pain.  Patient states she wears her Burchinal braces regularly, especially when using her hands for activities.  She states she has not noticed any swelling or stiffness in her hands or feet.  She wears proper footwear, which has helped with her feet pain.   Patient states she has been going to physical therapy since her last visit in May.  She was going regularly in the the summer for her hip, back, and neck.  She still goes once a month for her neck and performs daily exercises on her own.  She feels PT has helped her considerably.  She has improved ROM and strength of her neck.  She feels PT has also helped her posture.    Overall, she feels her fibromyalgia is under control.  Her insomnia has improved.   She states her eye dryness has improved with restasis.  Her left eye dryness has completely resolved and she no longer uses restasis in that eye.  She continues to have mouth dryness, but she feels her diet has helped a little bit.     Activities of Daily Living:  Patient reports morning stiffness for 20 minutes.   Patient Denies nocturnal pain.  Difficulty dressing/grooming: Denies Difficulty climbing stairs: Denies Difficulty getting out of chair: Denies Difficulty using hands for taps, buttons, cutlery, and/or writing: Reports   Review of Systems  Constitutional: Positive for fatigue. Negative for night sweats, weight gain, weight loss and weakness.  HENT: Positive for mouth dryness. Negative for mouth sores, trouble swallowing, trouble  swallowing and nose dryness.   Eyes: Positive for dryness (Right eye only). Negative for pain, redness and visual disturbance.  Respiratory: Negative for cough, shortness of breath and difficulty breathing.   Cardiovascular: Negative for chest pain, palpitations, hypertension, irregular heartbeat and swelling in legs/feet.  Gastrointestinal: Negative for blood in stool, constipation and diarrhea.  Endocrine: Negative for increased urination.  Genitourinary: Negative for vaginal dryness.  Musculoskeletal: Positive for arthralgias, joint pain and morning stiffness. Negative for joint swelling, myalgias, muscle weakness, muscle tenderness and myalgias.  Skin: Positive for color change. Negative for rash, hair loss, skin tightness, ulcers and sensitivity to sunlight.  Allergic/Immunologic: Negative for susceptible to infections.  Neurological: Negative for dizziness, memory loss and night sweats.  Hematological: Negative for swollen glands.  Psychiatric/Behavioral: Negative for depressed mood and sleep disturbance. The patient is not nervous/anxious.     PMFS History:  Patient Active Problem List   Diagnosis Date Noted  . Osteopenia of multiple sites 08/25/2016  . Vitamin D deficiency 08/25/2016  . Fatigue 02/17/2016  . Primary osteoarthritis of both feet 02/17/2016  . 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 Osteoarthritis of lumbar spine 02/17/2016  . Weakness of right lower extremity 02/17/2016  . Migraine without status migrainosus, not intractable 02/17/2016  . Raynaud's  02/17/2016  . Sicca syndrome 02/17/2016  . DUB (dysfunctional uterine bleeding)   . Fibromyalgia syndrome 09/10/2011  . Primary  osteoarthritis of both hands 09/10/2011  . Scoliosis 09/10/2011    Past Medical History:  Diagnosis Date  . Chronically dry eyes, right   . Dry mouth   . DUB (dysfunctional uterine bleeding)   . Fibromyalgia   . Raynaud's syndrome     Family History  Problem Relation Age of Onset  . Breast  cancer Paternal Aunt   . Breast cancer Paternal Grandmother   . Arthritis Mother   . Osteoporosis Mother   . Skin cancer Father   . Depression Maternal Grandmother   . Hypertension Maternal Grandmother   . Stroke Maternal Grandmother   . Kidney disease Maternal Grandfather   . Uterine cancer Maternal Aunt   . Metabolic syndrome Brother   . Diabetes Brother   . Hypertension Brother   . High Cholesterol Brother   . Stroke Brother    Past Surgical History:  Procedure Laterality Date  . SALPINGECTOMY     Social History   Social History Narrative  . Not on file     Objective: Vital Signs: BP 112/64 (BP Location: Left Arm, Patient Position: Sitting, Cuff Size: Normal)   Pulse (!) 58   Resp 16   Ht 5\' 5"  (1.651 m)   Wt 140 lb (63.5 kg)   BMI 23.30 kg/m    Physical Exam  Constitutional: She is oriented to person, place, and time. She appears well-developed and well-nourished.  HENT:  Head: Normocephalic and atraumatic.  No parotid swelling  Eyes: Conjunctivae and EOM are normal.  Neck: Normal range of motion.  Cardiovascular: Normal rate, regular rhythm, normal heart sounds and intact distal pulses.  Pulmonary/Chest: Effort normal and breath sounds normal.  Abdominal: Soft. Bowel sounds are normal.  Lymphadenopathy:    She has no cervical adenopathy.  Neurological: She is alert and oriented to person, place, and time.  Skin: Skin is warm and dry. Capillary refill takes less than 2 seconds.  Psychiatric: She has a normal mood and affect. Her behavior is normal.  Nursing note and vitals reviewed.    Musculoskeletal Exam: C-spine, thoracic spine, and lumbar spine good ROM without discomfort.  Shoulders joints, elbow joints, and wrist joints good ROM with no synovitis.  MCPs, PIPs, and DIPs good ROM with no synovitis.  Hips joints, knee joints, and ankle joints good ROM with no discomfort.  MTPs, PIPs, and DIPs good ROM with no synovitis.  No achilles tendonitis or plantar  fasciitis.    CDAI Exam: No CDAI exam completed.    Investigation: No additional findings.   Imaging: No results found.  Speciality Comments: No specialty comments available.    Procedures:  No procedures performed Allergies: Patient has no known allergies.   Assessment / Plan:     Visit Diagnoses: Fibromyalgia syndrome: Clinically patient is doing very well.  She feels her fibromyalgia is well controlled right now.  Her insomnia and fatigue have improved.    Sicca syndrome - She is doing well on Restasis.  She is only using Restasis in her right eye.   Other fatigue: She feels her fatigue has improved, especially after increasing her exercise regimen.    DDD (degenerative disc disease), cervical: Her neck pain has improved significantly after going to physical therapy since May.  Her ROM and strength are good.  She plans to continue to see her physical therapist once a month and to continue to perform her daily exercises at home.    Other form of scoliosis of thoracolumbar spine  Spondylosis without myelopathy  or radiculopathy, lumbar region  Primary osteoarthritis of both feet: Stable. Patient has proper fitting shoes, which have helped with her discomfort.   Primary osteoarthritis of both hands: No synovial thickening or synovitis on exam today.  No swelling or pain noted by patient.  Patient will continue using bilateral CMC braces as needed.    Osteopenia of multiple sites: followed by GYN provider.   Vitamin D deficiency - She is on supplement.  Patient will be getting her vitamin D checked by her GYN provider.  She also will be getting her DXA checked through her GYN provider.   History of migraine    Orders: No orders of the defined types were placed in this encounter.  No orders of the defined types were placed in this encounter.    Follow-Up Instructions: Return in about 1 year (around 03/02/2018) for Osteoarthritis and Fibromyalgia .   Bo Merino, MD  Note - This record has been created using Editor, commissioning.  Chart creation errors have been sought, but may not always  have been located. Such creation errors do not reflect on  the standard of medical care.

## 2017-03-02 ENCOUNTER — Ambulatory Visit: Payer: 59 | Admitting: Rheumatology

## 2017-03-02 ENCOUNTER — Encounter: Payer: Self-pay | Admitting: Rheumatology

## 2017-03-02 VITALS — BP 112/64 | HR 58 | Resp 16 | Ht 65.0 in | Wt 140.0 lb

## 2017-03-02 DIAGNOSIS — M19041 Primary osteoarthritis, right hand: Secondary | ICD-10-CM | POA: Diagnosis not present

## 2017-03-02 DIAGNOSIS — M5030 Other cervical disc degeneration, unspecified cervical region: Secondary | ICD-10-CM

## 2017-03-02 DIAGNOSIS — M8589 Other specified disorders of bone density and structure, multiple sites: Secondary | ICD-10-CM | POA: Diagnosis not present

## 2017-03-02 DIAGNOSIS — E559 Vitamin D deficiency, unspecified: Secondary | ICD-10-CM

## 2017-03-02 DIAGNOSIS — M3500 Sicca syndrome, unspecified: Secondary | ICD-10-CM | POA: Diagnosis not present

## 2017-03-02 DIAGNOSIS — M19072 Primary osteoarthritis, left ankle and foot: Secondary | ICD-10-CM | POA: Diagnosis not present

## 2017-03-02 DIAGNOSIS — M47816 Spondylosis without myelopathy or radiculopathy, lumbar region: Secondary | ICD-10-CM | POA: Diagnosis not present

## 2017-03-02 DIAGNOSIS — M797 Fibromyalgia: Secondary | ICD-10-CM | POA: Diagnosis not present

## 2017-03-02 DIAGNOSIS — M19042 Primary osteoarthritis, left hand: Secondary | ICD-10-CM

## 2017-03-02 DIAGNOSIS — M19071 Primary osteoarthritis, right ankle and foot: Secondary | ICD-10-CM

## 2017-03-02 DIAGNOSIS — M4185 Other forms of scoliosis, thoracolumbar region: Secondary | ICD-10-CM | POA: Diagnosis not present

## 2017-03-02 DIAGNOSIS — Z8669 Personal history of other diseases of the nervous system and sense organs: Secondary | ICD-10-CM | POA: Diagnosis not present

## 2017-03-02 DIAGNOSIS — R5383 Other fatigue: Secondary | ICD-10-CM | POA: Diagnosis not present

## 2017-03-04 DIAGNOSIS — M797 Fibromyalgia: Secondary | ICD-10-CM | POA: Diagnosis not present

## 2017-03-04 DIAGNOSIS — M542 Cervicalgia: Secondary | ICD-10-CM | POA: Diagnosis not present

## 2017-03-17 DIAGNOSIS — M542 Cervicalgia: Secondary | ICD-10-CM | POA: Diagnosis not present

## 2017-03-17 DIAGNOSIS — M797 Fibromyalgia: Secondary | ICD-10-CM | POA: Diagnosis not present

## 2017-04-07 DIAGNOSIS — M542 Cervicalgia: Secondary | ICD-10-CM | POA: Diagnosis not present

## 2017-04-07 DIAGNOSIS — M797 Fibromyalgia: Secondary | ICD-10-CM | POA: Diagnosis not present

## 2017-04-21 DIAGNOSIS — M797 Fibromyalgia: Secondary | ICD-10-CM | POA: Diagnosis not present

## 2017-04-21 DIAGNOSIS — M542 Cervicalgia: Secondary | ICD-10-CM | POA: Diagnosis not present

## 2017-05-05 DIAGNOSIS — M797 Fibromyalgia: Secondary | ICD-10-CM | POA: Diagnosis not present

## 2017-05-05 DIAGNOSIS — M542 Cervicalgia: Secondary | ICD-10-CM | POA: Diagnosis not present

## 2017-06-01 DIAGNOSIS — Z01419 Encounter for gynecological examination (general) (routine) without abnormal findings: Secondary | ICD-10-CM | POA: Diagnosis not present

## 2017-06-01 DIAGNOSIS — Z6823 Body mass index (BMI) 23.0-23.9, adult: Secondary | ICD-10-CM | POA: Diagnosis not present

## 2017-06-04 ENCOUNTER — Telehealth: Payer: Self-pay | Admitting: Rheumatology

## 2017-06-04 NOTE — Telephone Encounter (Signed)
Patient called stating that she lost her hand wraps and would like a replacement pair.  Patient states that she has an old pair but they don't work as well.

## 2017-06-07 NOTE — Telephone Encounter (Signed)
Patient states she needs another prescription for [facility] braces for both hands. Patient advised prescription will be waiting at the front for her.

## 2017-06-09 DIAGNOSIS — D1801 Hemangioma of skin and subcutaneous tissue: Secondary | ICD-10-CM | POA: Diagnosis not present

## 2017-06-09 DIAGNOSIS — L821 Other seborrheic keratosis: Secondary | ICD-10-CM | POA: Diagnosis not present

## 2017-06-09 DIAGNOSIS — L814 Other melanin hyperpigmentation: Secondary | ICD-10-CM | POA: Diagnosis not present

## 2017-09-27 DIAGNOSIS — M542 Cervicalgia: Secondary | ICD-10-CM | POA: Diagnosis not present

## 2017-09-27 DIAGNOSIS — M797 Fibromyalgia: Secondary | ICD-10-CM | POA: Diagnosis not present

## 2017-10-20 DIAGNOSIS — M797 Fibromyalgia: Secondary | ICD-10-CM | POA: Diagnosis not present

## 2017-10-20 DIAGNOSIS — M542 Cervicalgia: Secondary | ICD-10-CM | POA: Diagnosis not present

## 2017-11-05 DIAGNOSIS — M542 Cervicalgia: Secondary | ICD-10-CM | POA: Diagnosis not present

## 2017-11-05 DIAGNOSIS — M797 Fibromyalgia: Secondary | ICD-10-CM | POA: Diagnosis not present

## 2017-11-17 DIAGNOSIS — M542 Cervicalgia: Secondary | ICD-10-CM | POA: Diagnosis not present

## 2017-11-17 DIAGNOSIS — M797 Fibromyalgia: Secondary | ICD-10-CM | POA: Diagnosis not present

## 2017-12-01 DIAGNOSIS — M797 Fibromyalgia: Secondary | ICD-10-CM | POA: Diagnosis not present

## 2017-12-01 DIAGNOSIS — M542 Cervicalgia: Secondary | ICD-10-CM | POA: Diagnosis not present

## 2017-12-13 DIAGNOSIS — M542 Cervicalgia: Secondary | ICD-10-CM | POA: Diagnosis not present

## 2017-12-13 DIAGNOSIS — M797 Fibromyalgia: Secondary | ICD-10-CM | POA: Diagnosis not present

## 2017-12-29 DIAGNOSIS — M797 Fibromyalgia: Secondary | ICD-10-CM | POA: Diagnosis not present

## 2017-12-29 DIAGNOSIS — M542 Cervicalgia: Secondary | ICD-10-CM | POA: Diagnosis not present

## 2018-01-12 DIAGNOSIS — M542 Cervicalgia: Secondary | ICD-10-CM | POA: Diagnosis not present

## 2018-01-12 DIAGNOSIS — M797 Fibromyalgia: Secondary | ICD-10-CM | POA: Diagnosis not present

## 2018-01-21 DIAGNOSIS — Z23 Encounter for immunization: Secondary | ICD-10-CM | POA: Diagnosis not present

## 2018-02-22 NOTE — Progress Notes (Signed)
Office Visit Note  Patient: Alexis Hicks             Date of Birth: [birth year]           MRN: [id number]             PCP: Delsa Bern, MD Referring: Delsa Bern, MD Visit Date: 03/08/2018 Occupation: @GUAROCC @  Subjective:  Right ankle instability   History of Present Illness: Jillana Selph is a 57 y.o. female with history of fibromyalgia, osteoarthritis, and DDD.  She denies any recent fibromyalgia flares.  She has mild muscle tenderness at times, but she reports it is manageable.  She states she has been sleeping 9-12 hours per night and states her fatigue has been minimal.  She has some daytime drowsiness around 2 pm at work, but otherwise she has a good energy level.  She states she exercises on a daily basis and goes to physical therapy on a regular basis.  She states she recently got a stationary bike, and she has started biking 15 minutes every other day.  She performs daily exercises with yoga, band exercises, or dancing. She states her lower back has been doing well but she has occasional stiffness.  She states she sprained her right ankle in September.  She wears a lace up right ankle brace if she is walking prolonged distances.  She was evaluated by her physical therapist.  She denies any joint pain or joint swelling at this time.  She occasionally has bilateral CMC joint pain  She has intermittent neck pain and performs ROM exercises on a daily basis.   She continues to have sicca symptoms.  She uses restasis eye drops on a daily basis. Patient states her gynecologist continues to order DEXA scans.  She takes calcium and vitamin D.  She performs resistive exercises and weight lifts.     Activities of Daily Living:  Patient reports morning stiffness for 2 minutes.   Patient Denies nocturnal pain.  Difficulty dressing/grooming: Denies Difficulty climbing stairs: Denies Difficulty getting out of chair: Reports Difficulty using hands for taps, buttons, cutlery,  and/or writing: Denies  Review of Systems  Constitutional: Negative for fatigue.  HENT: Positive for mouth dryness. Negative for mouth sores and nose dryness.   Eyes: Positive for dryness. Negative for pain and visual disturbance.  Respiratory: Negative for cough, hemoptysis, shortness of breath and difficulty breathing.   Cardiovascular: Negative for chest pain, palpitations, hypertension and swelling in legs/feet.  Gastrointestinal: Negative for blood in stool, constipation and diarrhea.  Endocrine: Negative for increased urination.  Genitourinary: Negative for painful urination.  Musculoskeletal: Positive for myalgias, morning stiffness, muscle tenderness and myalgias. Negative for arthralgias, joint pain, joint swelling and muscle weakness.  Skin: Negative for color change, pallor, rash, hair loss, nodules/bumps, skin tightness, ulcers and sensitivity to sunlight.  Allergic/Immunologic: Negative for susceptible to infections.  Neurological: Negative for dizziness, numbness, headaches and weakness.  Hematological: Negative for swollen glands.  Psychiatric/Behavioral: Positive for depressed mood. Negative for sleep disturbance. The patient is nervous/anxious.     PMFS History:  Patient Active Problem List   Diagnosis Date Noted  . Osteopenia of multiple sites 08/25/2016  . Vitamin D deficiency 08/25/2016  . Fatigue 02/17/2016  . Primary osteoarthritis of both feet 02/17/2016  . 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 Osteoarthritis of lumbar spine 02/17/2016  . Weakness of right lower extremity 02/17/2016  . Migraine without status migrainosus, not intractable 02/17/2016  . Raynaud's  02/17/2016  . Sicca syndrome 02/17/2016  .  DUB (dysfunctional uterine bleeding)   . Fibromyalgia syndrome 09/10/2011  . Primary osteoarthritis of both hands 09/10/2011  . Scoliosis 09/10/2011    Past Medical History:  Diagnosis Date  . Chronically dry eyes, right   . Dry mouth   . DUB (dysfunctional uterine  bleeding)   . Fibromyalgia   . Raynaud's syndrome     Family History  Problem Relation Age of Onset  . Breast cancer Paternal Aunt   . Breast cancer Paternal Grandmother   . Arthritis Mother   . Osteoporosis Mother   . Skin cancer Father   . Depression Maternal Grandmother   . Hypertension Maternal Grandmother   . Stroke Maternal Grandmother   . Kidney disease Maternal Grandfather   . Uterine cancer Maternal Aunt   . Metabolic syndrome Brother   . Diabetes Brother   . Hypertension Brother   . High Cholesterol Brother   . Stroke Brother    Past Surgical History:  Procedure Laterality Date  . SALPINGECTOMY     Social History   Social History Narrative  . Not on file    Objective: Vital Signs: BP 103/64 (BP Location: Left Arm, Patient Position: Sitting, Cuff Size: Normal)   Pulse 68   Resp 13   Ht 5\' 5"  (1.651 m)   Wt 143 lb 12.8 oz (65.2 kg)   BMI 23.93 kg/m    Physical Exam  Constitutional: She is oriented to person, place, and time. She appears well-developed and well-nourished.  HENT:  Head: Normocephalic and atraumatic.  Eyes: Conjunctivae and EOM are normal.  Neck: Normal range of motion.  Cardiovascular: Normal rate, regular rhythm, normal heart sounds and intact distal pulses.  Pulmonary/Chest: Effort normal and breath sounds normal.  Abdominal: Soft. Bowel sounds are normal.  Lymphadenopathy:    She has no cervical adenopathy.  Neurological: She is alert and oriented to person, place, and time.  Skin: Skin is warm and dry. Capillary refill takes less than 2 seconds.  Psychiatric: She has a normal mood and affect. Her behavior is normal.  Nursing note and vitals reviewed.    Musculoskeletal Exam: C-spine, thoracic spine, and lumbar spine good ROM. No midline spinal tenderness.  No SI joint tenderness.  Shoulder joints, elbow joints, wrist joints, MCPs, PIPs, and DIPs good ROM with no synovitis.  Hip joints, knee joints, ankle joints, MTPs, PIPs, and DIPs  good ROM with no synovitis.  No warmth or effusion of knee joints. No tenderness or swelling of ankle joints.   CDAI Exam: CDAI Score: Not documented Patient Global Assessment: Not documented; Provider Global Assessment: Not documented Swollen: Not documented; Tender: Not documented Joint Exam   Not documented   There is currently no information documented on the homunculus. Go to the Rheumatology activity and complete the homunculus joint exam.  Investigation: No additional findings.  Imaging: No results found.  Recent Labs: No results found for: WBC, HGB, PLT, NA, K, CL, CO2, GLUCOSE, BUN, CREATININE, BILITOT, ALKPHOS, AST, ALT, PROT, ALBUMIN, CALCIUM, GFRAA, QFTBGOLD, QFTBGOLDPLUS  Speciality Comments: No specialty comments available.  Procedures:  No procedures performed Allergies: Patient has no known allergies.   Assessment / Plan:     Visit Diagnoses: Fibromyalgia syndrome: Her fibromyalgia has been very well controlled.  She has very minimal muscle tenderness and muscle aches.  She has been sleeping well at night.  She typically sleeps 9 to 12 hours per night.  She states that during the day she has daytime drowsiness around 2 PM while she  was at work but otherwise she has good energy.  She is very active and exercises on a daily basis.  She recently got a stationary bike and has been biking 15 minutes every other day.  She also performs exercises at home including weightlifting, yoga, and dancing.  She states her physical therapist on a regular basis.  She performs neck exercises on a daily basis.  She was encouraged to stay active and exercise on a regular basis.  She will follow-up in the office in 1 year.  Other fatigue: Improved.  She has some daytime drowsiness around 2 PM while she was at work otherwise her energy level has improved.  Sicca syndrome: She continues to have chronic sicca symptoms.  She is Restasis eyedrops on a daily basis.  She uses over-the-counter  products for mouth dryness.  DDD (degenerative disc disease), cervical: She is good range of motion on exam.  She is no symptoms of radiculopathy at this time.  She performs neck exercises on a regular basis.  She works with her physical therapist if she has increased neck stiffness.   Other form of scoliosis of thoracolumbar spine: She has no midline spinal tenderness.  She works on ROM of lumbar spine.   Spondylosis without myelopathy or radiculopathy, lumbar region: No midline spinal tenderness.  No symptoms of radiculopathy at this time.    Primary osteoarthritis of both hands: She has PIP and DIP synovial thickening.  She has no synovitis.  Complete fist formation bilaterally.  Joint protection and muscle strengthening were discussed.   Primary osteoarthritis of both feet: She has no discomfort in her feet at this time.  She has intermittent discomfort. She wears proper fitting shoes.   Osteopenia of multiple sites - DEXA ordered by gynecologist.  She takes a calcium and vitamin D supplement.  We discussed the importance of resistive exercises.   Vitamin D deficiency - She is taking a daily vitamin D supplement.   History of migraine   Orders: No orders of the defined types were placed in this encounter.  No orders of the defined types were placed in this encounter.   Face-to-face time spent with patient was 30 minutes. Greater than 50% of time was spent in counseling and coordination of care.  Follow-Up Instructions: Return in about 1 year (around 03/09/2019) for Fibromyalgia, Osteoarthritis, DDD.   Ofilia Neas, PA-C   I examined and evaluated the patient with Hazel Sams PA.  Patient's fibromyalgia symptoms are tolerable on current regimen.  She has been exercising on a regular basis.  She continues to have some discomfort in the spinal region from underlying disc disease.  Joint protection and muscle strengthening was discussed.  The plan of care was discussed as noted  above.  Bo Merino, MD Note - This record has been created using Editor, commissioning.  Chart creation errors have been sought, but may not always  have been located. Such creation errors do not reflect on  the standard of medical care.

## 2018-03-08 ENCOUNTER — Ambulatory Visit: Payer: 59 | Admitting: Rheumatology

## 2018-03-08 ENCOUNTER — Encounter: Payer: Self-pay | Admitting: Rheumatology

## 2018-03-08 VITALS — BP 103/64 | HR 68 | Resp 13 | Ht 65.0 in | Wt 143.8 lb

## 2018-03-08 DIAGNOSIS — M5030 Other cervical disc degeneration, unspecified cervical region: Secondary | ICD-10-CM

## 2018-03-08 DIAGNOSIS — M8589 Other specified disorders of bone density and structure, multiple sites: Secondary | ICD-10-CM

## 2018-03-08 DIAGNOSIS — E559 Vitamin D deficiency, unspecified: Secondary | ICD-10-CM

## 2018-03-08 DIAGNOSIS — M797 Fibromyalgia: Secondary | ICD-10-CM | POA: Diagnosis not present

## 2018-03-08 DIAGNOSIS — M4185 Other forms of scoliosis, thoracolumbar region: Secondary | ICD-10-CM

## 2018-03-08 DIAGNOSIS — M3500 Sicca syndrome, unspecified: Secondary | ICD-10-CM

## 2018-03-08 DIAGNOSIS — M19041 Primary osteoarthritis, right hand: Secondary | ICD-10-CM

## 2018-03-08 DIAGNOSIS — M47816 Spondylosis without myelopathy or radiculopathy, lumbar region: Secondary | ICD-10-CM

## 2018-03-08 DIAGNOSIS — M19072 Primary osteoarthritis, left ankle and foot: Secondary | ICD-10-CM

## 2018-03-08 DIAGNOSIS — Z8669 Personal history of other diseases of the nervous system and sense organs: Secondary | ICD-10-CM

## 2018-03-08 DIAGNOSIS — R5383 Other fatigue: Secondary | ICD-10-CM

## 2018-03-08 DIAGNOSIS — M19071 Primary osteoarthritis, right ankle and foot: Secondary | ICD-10-CM

## 2018-03-08 DIAGNOSIS — M19042 Primary osteoarthritis, left hand: Secondary | ICD-10-CM

## 2018-03-08 NOTE — Patient Instructions (Signed)
Ankle Exercises Ask your health care provider which exercises are safe for you. Do exercises exactly as told by your health care provider and adjust them as directed. It is normal to feel mild stretching, pulling, tightness, or discomfort as you do these exercises, but you should stop right away if you feel sudden pain or your pain gets worse. Do not begin these exercises until told by your health care provider. Stretching and range of motion exercises These exercises warm up your muscles and joints and improve the movement and flexibility of your ankle. These exercises also help to relieve pain, numbness, and tingling. Exercise A: Dorsiflexion/Plantar Flexion  1. Sit with your __________ knee straight or bent. Do not rest your foot on anything. 2. Flex your __________ ankle to tilt the top of your foot toward your shin. 3. Hold this position for __________ seconds. 4. Point your toes downward to tilt the top of your foot away from your shin. 5. Hold this position for __________ seconds. Repeat __________ times. Complete this exercise __________ times a day. Exercise B: Ankle Alphabet  1. Sit with your __________ foot supported at your lower leg. ? Do not rest your foot on anything. ? Make sure your foot has room to move freely. 2. Think of your __________ foot as a paintbrush, and move your foot to trace each letter of the alphabet in the air. Keep your hip and knee still while you trace. Make the letters as large as you can without increasing any discomfort. 3. Trace every letter from A to Z. Repeat __________ times. Complete this exercise __________ times a day. Exercise C: Ankle Dorsiflexion, Passive 1. Sit on a chair that is placed on a non-carpeted surface. 2. Place your __________ foot on the floor, directly under your __________ knee. Extend your __________ leg for support. 3. Keeping your heel down, slide your __________ foot back toward the chair until you feel a stretch at your  ankle or calf. If you do not feel a stretch, slide your buttocks forward to the edge of the chair. 4. Hold this stretch for __________ seconds. Repeat __________ times. Complete this stretch __________ times a day. Strengthening exercises These exercises build strength and endurance in your ankle. Endurance is the ability to use your muscles for a long time, even after they get tired. Exercise D: Dorsiflexors  1. Secure a rubber exercise band or tube to an object, such as a table leg, that will stay still when the band is pulled. Secure the other end around your __________ foot. 2. Sit on the floor, facing the object with your __________ leg extended. The band or tube should be slightly tense when your foot is relaxed. 3. Slowly flex your __________ ankle and toes to bring your foot toward you. 4. Hold this position for __________ seconds. 5. Slowly return your foot to the starting position, controlling the band as you do that. Repeat __________ times. Complete this exercise __________ times a day. Exercise E: Plantar Flexors  1. Sit on the floor with your __________ leg extended. 2. Loop a rubber exercise band or tube around the ball of your __________ foot. The ball of your foot is on the walking surface, right under your toes. The band or tube should be slightly tense when your foot is relaxed. 3. Slowly point your toes downward, pushing them away from you. 4. Hold this position for __________ seconds. 5. Slowly release the tension in the band or tube, controlling smoothly until your foot is  back in the starting position. Repeat __________ times. Complete this exercise __________ times a day. Exercise F: Towel Curls  1. Sit in a chair on a non-carpeted surface, and put your feet on the floor. 2. Place a towel in front of your feet. If told by your health care provider, add __________ to the end of the towel. 3. Keeping your heel on the floor, put your __________ foot on the  towel. 4. Pull the towel toward you by grabbing the towel with your toes and curling them under. Keep your heel on the floor. 5. Let your toes relax. 6. Grab the towel again. Keep going until the towel is completely underneath your foot. Repeat __________ times. Complete this exercise __________ times a day. Exercise G: Heel Raise ( Plantar Flexors, Standing) 1. Stand with your feet shoulder-width apart. 2. Keep your weight spread evenly over the width of your feet while you rise up on your toes. Use a wall or table to steady yourself, but try not to use it for support. 3. If this exercise is too easy, try these options: ? Shift your weight toward your __________ leg until you feel challenged. ? If told by your health care provider, lift your uninjured leg off the floor. 4. Hold this position for __________ seconds. Repeat __________ times. Complete this exercise __________ times a day. Exercise H: Tandem Walking 1. Stand with one foot directly in front of the other. 2. Slowly raise your back foot up, lifting your heel before your toes, and place it directly in front of your other foot. 3. Continue to walk in this heel-to-toe way for __________ or for as long as told by your health care provider. Have a countertop or wall nearby to use if needed to keep your balance, but try not to hold onto anything for support. Repeat __________ times. Complete this exercises __________ times a day. This information is not intended to replace advice given to you by your health care provider. Make sure you discuss any questions you have with your health care provider. Document Released: 02/04/2005 Document Revised: 11/21/2015 Document Reviewed: 12/09/2014 Elsevier Interactive Patient Education  2018 [employer].

## 2018-04-06 DIAGNOSIS — M810 Age-related osteoporosis without current pathological fracture: Secondary | ICD-10-CM

## 2018-04-06 HISTORY — DX: Age-related osteoporosis without current pathological fracture: M81.0

## 2018-08-22 ENCOUNTER — Ambulatory Visit: Payer: Self-pay | Admitting: Obstetrics and Gynecology

## 2018-08-22 ENCOUNTER — Telehealth (INDEPENDENT_AMBULATORY_CARE_PROVIDER_SITE_OTHER): Payer: 59 | Admitting: Family Medicine

## 2018-08-22 DIAGNOSIS — J309 Allergic rhinitis, unspecified: Secondary | ICD-10-CM

## 2018-08-22 DIAGNOSIS — J029 Acute pharyngitis, unspecified: Secondary | ICD-10-CM | POA: Diagnosis not present

## 2018-08-22 NOTE — Telephone Encounter (Signed)
Pt. Has had a sore throat since Thursday and is concerned about COVID 19. Warm transfer to [facility] in the practice for a visit.  Answer Assessment - Initial Assessment Questions 1. COVID-19 DIAGNOSIS: "Who made your Coronavirus (COVID-19) diagnosis?" "Was it confirmed by a positive lab test?" If not diagnosed by a HCP, ask "Are there lots of cases (community spread) where you live?" (See public health department website, if unsure)   * MAJOR community spread: high number of cases; numbers of cases are increasing; many people hospitalized.   * MINOR community spread: low number of cases; not increasing; few or no people hospitalized     No testing 2. ONSET: "When did the COVID-19 symptoms start?"      Thursday 3. WORST SYMPTOM: "What is your worst symptom?" (e.g., cough, fever, shortness of breath, muscle aches)     Glands swollen, era fullness 4. COUGH: "Do you have a cough?" If so, ask: "How bad is the cough?"       No 5. FEVER: "Do you have a fever?" If so, ask: "What is your temperature, how was it measured, and when did it start?"     No 6. RESPIRATORY STATUS: "Describe your breathing?" (e.g., shortness of breath, wheezing, unable to speak)      No 7. BETTER-SAME-WORSE: "Are you getting better, staying the same or getting worse compared to yesterday?"  If getting worse, ask, "In what way?"     Better 8. HIGH RISK DISEASE: "Do you have any chronic medical problems?" (e.g., asthma, heart or lung disease, weak immune system, etc.)     No 9. PREGNANCY: "Is there any chance you are pregnant?" "When was your last menstrual period?"     No 10. OTHER SYMPTOMS: "Do you have any other symptoms?"  (e.g., runny nose, headache, sore throat, loss of smell)       No  Protocols used: CORONAVIRUS (COVID-19) DIAGNOSED OR SUSPECTED-A-AH

## 2018-08-22 NOTE — Progress Notes (Signed)
Virtual Visit via Telephone Note  I connected with Alexis Hicks on 08/22/18 at 10:52 AM by telephone and verified that I am speaking with the correct person using two identifiers.   I discussed the limitations, risks, security and privacy concerns of performing an evaluation and management service by telephone and the availability of in person appointments. I also discussed with the patient that there may be a patient responsible charge related to this service. The patient expressed understanding and agreed to proceed, consent obtained  Chief complaint: Sore throat  History of Present Illness: Alexis Hicks is a 58 y.o. female  Sore Throat: Has noted sore throat about 6 days ago. May be a little better past few days.  Slight HA yesterday.  Feel like glands swollen in neck - sore in glands, past 4-5 days.  Has spring allergies - usually worse March-April, but has had continuous rhinorrhea since February. Episodic OTC antihistamine, but causes sedation - used once over the weekend. Helped nose some. Aspirin at times - mod relief of sore throat. Has had some PND. Has not tried nasal spray.  No cough, no shortness of breath.  No change in taste or smell.  No new bodyaches.  No diarrhea/nausea/vomiting.  No fever - has temp checked daily at work. 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 Wearing mask at work.  No known sick contacts. Has been taking things things to parents, but they are well. No known sick contacts.    Patient Active Problem List   Diagnosis Date Noted  . Osteopenia of multiple sites 08/25/2016  . Vitamin D deficiency 08/25/2016  . Fatigue 02/17/2016  . Primary osteoarthritis of both feet 02/17/2016  . 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 Osteoarthritis of lumbar spine 02/17/2016  . Weakness of right lower extremity 02/17/2016  . Migraine without status migrainosus, not intractable 02/17/2016  . Raynaud's  02/17/2016  .  Sicca syndrome 02/17/2016  . DUB (dysfunctional uterine bleeding)   . Fibromyalgia syndrome 09/10/2011  . Primary osteoarthritis of both hands 09/10/2011  . Scoliosis 09/10/2011   Past Medical History:  Diagnosis Date  . Chronically dry eyes, right   . Dry mouth   . DUB (dysfunctional uterine bleeding)   . Fibromyalgia   . Raynaud's syndrome    Past Surgical History:  Procedure Laterality Date  . SALPINGECTOMY     No Known Allergies Prior to Admission medications   Medication Sig Start Date End Date Taking? Authorizing Provider  Biotin 1 MG CAPS Take by mouth.   Yes [provider]  Calcium Carbonate-Vitamin D (CALCIUM 600+D HIGH POTENCY PO) calcium   Yes [provider]  cholecalciferol (VITAMIN D) 1000 units tablet Take 1,000 Units by mouth daily.   Yes [provider]  Cyanocobalamin (VITAMIN B 12 PO) Take by mouth.   Yes [provider]  cycloSPORINE (RESTASIS) 0.05 % ophthalmic emulsion 1 drop daily.   Yes [provider]  SALINE NASAL MIST NA Saline Nasal   Yes [provider]  folic acid (FOLVITE) 1 MG tablet Take 1 mg by mouth daily.    [provider]   Social History   Socioeconomic History  . Marital status: Married    Spouse name: Not on file  . Number of children: Not on file  . Years of education: Not on file  . Highest education level: Not on file  Occupational History  . Not on file  Social Needs  . Financial resource strain: Not  on file  . Food insecurity:    Worry: Not on file    Inability: Not on file  . Transportation needs:    Medical: Not on file    Non-medical: Not on file  Tobacco Use  . Smoking status: Former Smoker    Packs/day: 0.05    Years: 0.75    Pack years: 0.03    Types: Cigarettes  . Smokeless tobacco: Never Used  Substance and Sexual Activity  . Alcohol use: Yes    Alcohol/week: 2.0 - 3.0 standard drinks    Types: 2 - 3 Glasses of wine per week  . Drug use: No   . Sexual activity: Yes    Birth control/protection: Pill    Comment: Micronor  Lifestyle  . Physical activity:    Days per week: Not on file    Minutes per session: Not on file  . Stress: Not on file  Relationships  . Social connections:    Talks on phone: Not on file    Gets together: Not on file    Attends religious service: Not on file    Active member of club or organization: Not on file    Attends meetings of clubs or organizations: Not on file    Relationship status: Not on file  . Intimate partner violence:    Fear of current or ex partner: Not on file    Emotionally abused: Not on file    Physically abused: Not on file    Forced sexual activity: Not on file  Other Topics Concern  . Not on file  Social History Narrative  . Not on file     Observations/Objective: Speaking in full sentences, no dyspnea.  No distress.  Appropriate responses.  Assessment and Plan: Allergic rhinitis, unspecified seasonality, unspecified trigger  Sore throat 5 to 6-day history of sore throat, but has underlying allergic rhinitis with postnasal drip.  No fever or other associated symptoms.  Possible lymphadenopathy/sore glands but overall symptoms are slightly improved.  Differential includes viral illness, less likely strep, and possible postnasal drip from allergies.  No other symptoms to suggest COVID-19 at this time.  -Symptomatic care discussed with handout given, can try fluticasone nasal spray for allergy/postnasal drip, and if not improving in 48 hours then consider strep testing, sooner if worse and RTC/ER precautions given for other symptoms.  -Option of out of work for next 2 days while symptoms are being treated and monitored.  Understanding expressed  Follow Up Instructions: 2 days if not improving.    I discussed the assessment and treatment plan with the patient. The patient was provided an opportunity to ask questions and all were answered. The patient agreed with the plan  and demonstrated an understanding of the instructions.   The patient was advised to call back or seek an in-person evaluation if the symptoms worsen or if the condition fails to improve as anticipated.  I provided 22 minutes of non-face-to-face time during this encounter.  Signed,   Merri Ray, MD [facility].  08/22/18

## 2018-08-22 NOTE — Patient Instructions (Addendum)
Sore throat may be related to either viral infection or postnasal drip from allergies.  I think it is reasonable to try some fluticasone nasal spray initially for possible allergy symptoms, and see other information on sore throat care below.  If sore throat is continuing to improve, okay to remain on that treatment.  If not improving in the next 2 days or any worsening symptoms, in office evaluation may be needed for strep throat testing although less likely.    If you experience cough, shortness of breath, body aches, headaches, change in taste or smell, fever,  diarrhea, or nausea/vomiting, then please call in and we can instruct you on further treatment/testing.   Sore Throat A sore throat is pain, burning, irritation, or scratchiness in the throat. When you have a sore throat, you may feel pain or tenderness in your throat when you swallow or talk. Many things can cause a sore throat, including:  An infection.  Seasonal allergies.  Dryness in the air.  Irritants, such as smoke or pollution.  Radiation treatment to the area.  Gastroesophageal reflux disease (GERD).  A tumor. A sore throat is often the first sign of another sickness. It may happen with other symptoms, such as coughing, sneezing, fever, and swollen neck glands. Most sore throats go away without medical treatment. Follow these instructions at home:      Take over-the-counter medicines only as told by your health care provider. ? If your child has a sore throat, do not give your child aspirin because of the association with Reye syndrome.  Drink enough fluids to keep your urine pale yellow.  Rest as needed.  To help with pain, try: ? Sipping warm liquids, such as broth, herbal tea, or warm water. ? Eating or drinking cold or frozen liquids, such as frozen ice pops. ? Gargling with a salt-water mixture 3-4 times a day or as needed. To make a salt-water mixture, completely dissolve -1 tsp (3-6 g) of salt in 1 cup  (237 mL) of warm water. ? Sucking on hard candy or throat lozenges. ? Putting a cool-mist humidifier in your bedroom at night to moisten the air. ? Sitting in the bathroom with the door closed for 5-10 minutes while you run hot water in the shower.  Do not use any products that contain nicotine or tobacco, such as cigarettes, e-cigarettes, and chewing tobacco. If you need help quitting, ask your health care provider.  Wash your hands well and often with soap and water. If soap and water are not available, use hand sanitizer. Contact a health care provider if:  You have a fever for more than 2-3 days.  You have symptoms that last (are persistent) for more than 2-3 days.  Your throat does not get better within 7 days.  You have a fever and your symptoms suddenly get worse.  Your child who is 3 months to 17 years old has a temperature of 102.59F (39C) or higher. Get help right away if:  You have difficulty breathing.  You cannot swallow fluids, soft foods, or your saliva.  You have increased swelling in your throat or neck.  You have persistent nausea and vomiting. Summary  A sore throat is pain, burning, irritation, or scratchiness in the throat. Many things can cause a sore throat.  Take over-the-counter medicines only as told by your health care provider. Do not give your child aspirin.  Drink plenty of fluids, and rest as needed.  Contact a health care provider if your  symptoms worsen or your sore throat does not get better within 7 days. This information is not intended to replace advice given to you by your health care provider. Make sure you discuss any questions you have with your health care provider. Document Released: 04/30/2004 Document Revised: 08/23/2017 Document Reviewed: 08/23/2017 Elsevier Interactive Patient Education  [employer].    If you have lab work done today you will be contacted with your lab results within the next 2 weeks.  If you have not  heard from [place] then please contact us. The fastest way to get your results is to register for My Chart.   IF you received an x-ray today, you will receive an invoice from [facility]. Please contact [facility] at [phone] with questions or concerns regarding your invoice.   IF you received labwork today, you will receive an invoice from [place]. Please contact [facility] at [phone] with questions or concerns regarding your invoice.   Our billing staff will not be able to assist you with questions regarding bills from these companies.  You will be contacted with the lab results as soon as they are available. The fastest way to get your results is to activate your My Chart account. Instructions are located on the last page of this paperwork. If you have not heard from [place] regarding the results in 2 weeks, please contact this office.

## 2018-08-22 NOTE — Progress Notes (Signed)
CC: Sore throat since Thursday, per pt some better but it is intermittent.  Pain level when it hurts is 3/10, no fever, nausea, emesis or belly pain.  Per pt her glands are swollen in her throat.  No recent bp or weight taken other than those taken at ov.  No travel outside the [place] or [place] in the past 3 weeks.

## 2018-12-28 NOTE — Progress Notes (Signed)
Office Visit Note  Patient: Alexis Hicks             Date of Birth: 1960-05-08           MRN: [id number]             PCP: Delsa Bern, MD Referring: Delsa Bern, MD Visit Date: 01/11/2019 Occupation: @GUAROCC @  Subjective:  Discuss new diagnosis of osteoporosis   History of Present Illness: Alexis Hicks is a 58 y.o. female with history of fibromyalgia and DDD.  She continues to have generalized muscle aches and muscle tenderness due to fibromyalgia.  She has chronic fatigue despite sleeping 8-10 hours per night. She remains active and exercises on a regular basis.  She states in March she was diagnosed with osteoporosis.  She states she was advised to start taking Boniva, and she took 1 dose and discontinued due to experiencing GI side effects.  She states she does not want to be on treatment for osteoporosis at this time.  She has been focusing on performing resistive exercises on a regular basis.  She has started performing weight lifting 3 days weekly and has started using resistive bands.  She has also started weight bearing exercises.  She has also eliminated caffeine and is limiting alcohol intake.   She has intermittent pain in both feet, especially the right 2nd toe.  She states in the past she had right foot drop, but it has improved over the years. She states she continues to have persistent nerve damage that has caused the right 2nd toe to start to "droop."  She has tried wearing a brace but has not noticed much improvement.    Activities of Daily Living:  Patient reports morning stiffness for 5 minutes.   Patient Denies nocturnal pain.  Difficulty dressing/grooming: Denies Difficulty climbing stairs: Denies Difficulty getting out of chair: Denies Difficulty using hands for taps, buttons, cutlery, and/or writing: Reports  Review of Systems  Constitutional: Positive for fatigue.  HENT: Negative for mouth sores, mouth dryness and nose dryness.   Eyes:  Positive for dryness. Negative for pain, itching and visual disturbance.  Respiratory: Negative for cough, hemoptysis, shortness of breath, wheezing and difficulty breathing.   Cardiovascular: Negative for chest pain, palpitations, hypertension and swelling in legs/feet.  Gastrointestinal: Negative for abdominal pain, blood in stool, constipation and diarrhea.  Endocrine: Negative for increased urination.  Genitourinary: Negative for difficulty urinating and painful urination.  Musculoskeletal: Positive for morning stiffness. Negative for arthralgias, joint pain, joint swelling, myalgias, muscle weakness, muscle tenderness and myalgias.  Skin: Positive for rash. Negative for color change, pallor, hair loss, nodules/bumps, skin tightness, ulcers and sensitivity to sunlight.  Allergic/Immunologic: Negative for susceptible to infections.  Neurological: Negative for dizziness, light-headedness, headaches, memory loss and weakness.  Hematological: Negative for bruising/bleeding tendency and swollen glands.  Psychiatric/Behavioral: Negative for depressed mood, confusion and sleep disturbance. The patient is not nervous/anxious.     PMFS History:  Patient Active Problem List   Diagnosis Date Noted  . Osteopenia of multiple sites 08/25/2016  . Vitamin D deficiency 08/25/2016  . Fatigue 02/17/2016  . Primary osteoarthritis of both feet 02/17/2016  . 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 Osteoarthritis of lumbar spine 02/17/2016  . Weakness of right lower extremity 02/17/2016  . Migraine without status migrainosus, not intractable 02/17/2016  . Raynaud's  02/17/2016  . Sicca syndrome 02/17/2016  . DUB (dysfunctional uterine bleeding)   . Fibromyalgia syndrome 09/10/2011  . Primary osteoarthritis of both hands 09/10/2011  .  Scoliosis 09/10/2011    Past Medical History:  Diagnosis Date  . Chronically dry eyes, right   . Dry mouth   . DUB (dysfunctional uterine bleeding)   . Fibromyalgia   . Osteoporosis  04/2018   Dr. Butler [place]  . Raynaud's syndrome     Family History  Problem Relation Age of Onset  . Breast cancer Paternal Aunt   . Breast cancer Paternal Grandmother   . Arthritis Mother   . Osteoporosis Mother   . Dementia Mother   . Skin cancer Father   . Depression Maternal Grandmother   . Hypertension Maternal Grandmother   . Stroke Maternal Grandmother   . Kidney disease Maternal Grandfather   . Uterine cancer Maternal Aunt   . Metabolic syndrome Brother   . Diabetes Brother   . Hypertension Brother   . High Cholesterol Brother   . Stroke Brother    Past Surgical History:  Procedure Laterality Date  . SALPINGECTOMY     Social History   Social History Narrative  . Not on file    There is no immunization history on file for this patient.   Objective: Vital Signs: BP 110/66 (BP Location: Left Arm, Patient Position: Sitting, Cuff Size: Normal)   Pulse 67   Resp 12   Ht 5\' 5"  (1.651 m)   Wt 142 lb 12.8 oz (64.8 kg)   BMI 23.76 kg/m    Physical Exam Vitals signs and nursing note reviewed.  Constitutional:      Appearance: She is well-developed.  HENT:     Head: Normocephalic and atraumatic.  Eyes:     Conjunctiva/sclera: Conjunctivae normal.  Neck:     Musculoskeletal: Normal range of motion.  Cardiovascular:     Rate and Rhythm: Normal rate and regular rhythm.     Heart sounds: Normal heart sounds.  Pulmonary:     Effort: Pulmonary effort is normal.     Breath sounds: Normal breath sounds.  Abdominal:     General: Bowel sounds are normal.     Palpations: Abdomen is soft.  Lymphadenopathy:     Cervical: No cervical adenopathy.  Skin:    General: Skin is warm and dry.     Capillary Refill: Capillary refill takes less than 2 seconds.  Neurological:     Mental Status: She is alert and oriented to person, place, and time.  Psychiatric:        Behavior: Behavior normal.      Musculoskeletal Exam: C-spine, thoracic spine, and lumbar spine good ROM.   No midline spinal tenderness.  No SI joint tenderness.  Shoulder joints, elbow joints, wrist joints, MCPs, PIPs, and DIPs good ROM with no synovitis.  Hip joints, knee joints, ankle joints, MTPs, PIPs ,and DIPs good ROM with no synovitis.  No warmth or effusion of knee joints.  No tenderness or swelling of ankle joints.  She has mild osteoarthritic changes in both feet.  She has DIP subluxation of the second toe.   CDAI Exam: CDAI Score: - Patient Global: -; Provider Global: - Swollen: -; Tender: - Joint Exam   No joint exam has been documented for this visit   There is currently no information documented on the homunculus. Go to the Rheumatology activity and complete the homunculus joint exam.  Investigation: No additional findings.  Imaging: No results found.  Recent Labs: No results found for: WBC, HGB, PLT, NA, K, CL, CO2, GLUCOSE, BUN, CREATININE, BILITOT, ALKPHOS, AST, ALT, PROT, ALBUMIN, CALCIUM, GFRAA, QFTBGOLD, QFTBGOLDPLUS  Speciality Comments: No specialty comments available.  Procedures:  No procedures performed Allergies: Patient has no known allergies.   Assessment / Plan:     Visit Diagnoses: Fibromyalgia syndrome: She has generalized hyperalgesia and positive tender points.  She has generalized muscle aches and muscle tenderness.  She remains active and exercises on a regular basis.  She has been performing weight lifting exercises and using the elliptical several days per week.  She was encouraged to stay active and exercise on a regular basis.  She has chronic fatigue but has been sleeping well at night.  She typically sleeps about 8-10 hours per night.  She will follow up in 6 months.    Sicca syndrome: She has chronic eye dryness but no mouth dryness at this time.  Other fatigue: She has chronic fatigue despite sleeping well at night.    DDD (degenerative disc disease), cervical: She has good ROM with no discomfort.  She goes to physical therapy every 3 weeks.   She has no symptoms of radiculopathy at this time.  Other form of scoliosis of thoracolumbar spine: No midline spinal tenderness.    Spondylosis without myelopathy or radiculopathy, lumbar region: She has no lower back pain at this time.    Primary osteoarthritis of both hands: She has mild PIP and DIP synovial thickening consistent with osteoarthritis of both hands.  No synovitis or tenderness on exam.  She has complete fist formation bilaterally.  Joint protection and muscle strengthening were discussed.   Primary osteoarthritis of both feet: She has mild osteoarthritic changes in both feet. She has DIP subluxation of the right second toe.     Other osteoporosis without current pathological fracture- DEXA ordered by gynecologist.  According to the patient she was diagnosed with osteoporosis in March 2020.  She was started on Boniva at that time and took 1 dose but discontinued due to not being able to tolerate GI side effects.  She is taking a calcium and vitamin D supplement.  She has eliminated caffeine and is limited alcohol use.  We discussed resistive exercises at length.  She has started weight lifting and weight bearing exercises several days per week. She was advised to have a copy of her recent DEXA sent to our office for [place] to review.   Vitamin D deficiency: She is taking a vitamin D supplement daily.   History of migraine  Orders: No orders of the defined types were placed in this encounter.  No orders of the defined types were placed in this encounter.   Face-to-face time spent with patient was 30 minutes. Greater than 50% of time was spent in counseling and coordination of care.  Follow-Up Instructions: Return in about 6 months (around 07/12/2019) for Fibromyalgia, DDD.   Ofilia Neas, PA-C   I examined and evaluated the patient with Hazel Sams PA.  Upon to take any treatment for osteoporosis at this time.  I detailed discussion regarding the use of calcium, vitamin D and  resistive exercises.  I have also advised her to bring her bone density results at the follow-up visit.  The plan of care was discussed as noted above.  Bo Merino, MD  Note - This record has been created using Editor, commissioning.  Chart creation errors have been sought, but may not always  have been located. Such creation errors do not reflect on  the standard of medical care.

## 2019-01-11 ENCOUNTER — Other Ambulatory Visit: Payer: Self-pay

## 2019-01-11 ENCOUNTER — Ambulatory Visit: Payer: 59 | Admitting: Rheumatology

## 2019-01-11 ENCOUNTER — Encounter: Payer: Self-pay | Admitting: Rheumatology

## 2019-01-11 VITALS — BP 110/66 | HR 67 | Resp 12 | Ht 65.0 in | Wt 142.8 lb

## 2019-01-11 DIAGNOSIS — M19041 Primary osteoarthritis, right hand: Secondary | ICD-10-CM

## 2019-01-11 DIAGNOSIS — M797 Fibromyalgia: Secondary | ICD-10-CM | POA: Diagnosis not present

## 2019-01-11 DIAGNOSIS — M3500 Sicca syndrome, unspecified: Secondary | ICD-10-CM | POA: Diagnosis not present

## 2019-01-11 DIAGNOSIS — Z8669 Personal history of other diseases of the nervous system and sense organs: Secondary | ICD-10-CM

## 2019-01-11 DIAGNOSIS — M19042 Primary osteoarthritis, left hand: Secondary | ICD-10-CM

## 2019-01-11 DIAGNOSIS — M818 Other osteoporosis without current pathological fracture: Secondary | ICD-10-CM

## 2019-01-11 DIAGNOSIS — M19071 Primary osteoarthritis, right ankle and foot: Secondary | ICD-10-CM

## 2019-01-11 DIAGNOSIS — M47816 Spondylosis without myelopathy or radiculopathy, lumbar region: Secondary | ICD-10-CM

## 2019-01-11 DIAGNOSIS — E559 Vitamin D deficiency, unspecified: Secondary | ICD-10-CM

## 2019-01-11 DIAGNOSIS — M19072 Primary osteoarthritis, left ankle and foot: Secondary | ICD-10-CM

## 2019-01-11 DIAGNOSIS — R5383 Other fatigue: Secondary | ICD-10-CM

## 2019-01-11 DIAGNOSIS — M5030 Other cervical disc degeneration, unspecified cervical region: Secondary | ICD-10-CM | POA: Diagnosis not present

## 2019-01-11 DIAGNOSIS — M4185 Other forms of scoliosis, thoracolumbar region: Secondary | ICD-10-CM

## 2019-01-11 NOTE — Patient Instructions (Signed)
Ankle Exercises Ask your health care provider which exercises are safe for you. Do exercises exactly as told by your health care provider and adjust them as directed. It is normal to feel mild stretching, pulling, tightness, or mild discomfort as you do these exercises. Stop right away if you feel sudden pain or your pain gets worse. Do not begin these exercises until told by your health care provider. Stretching and range-of-motion exercises These exercises warm up your muscles and joints and improve the movement and flexibility of your ankle. These exercises may also help to relieve pain. Dorsiflexion/plantar flexion  1. Sit with your __________ knee straight or bent. Do not rest your foot on anything. 2. Flex your __________ ankle to tilt the top of your foot toward your shin. This is called dorsiflexion. 3. Hold this position for __________ seconds. 4. Point your toes downward to tilt the top of your foot away from your shin. This is called plantar flexion. 5. Hold this position for __________ seconds. Repeat __________ times. Complete this exercise __________ times a day. Ankle alphabet  1. Sit with your __________ foot supported at your lower leg. ? Do not rest your foot on anything. ? Make sure your foot has room to move freely. 2. Think of your __________ foot as a paintbrush: ? Move your foot to trace each letter of the alphabet in the air. Keep your hip and knee still while you trace the letters. Trace every letter from A to Z. ? Make the letters as large as you can without causing or increasing any discomfort. Repeat __________ times. Complete this exercise __________ times a day. Passive ankle dorsiflexion This is an exercise in which something or someone moves your ankle for you. You do not move it yourself. 1. Sit on a chair that is placed on a non-carpeted surface. 2. Place your __________ foot on the floor, directly under your __________ knee. Extend your __________ leg for  support. 3. Keeping your heel down, slide your __________ foot back toward the chair until you feel a stretch at your ankle or calf. If you do not feel a stretch, slide your buttocks forward to the edge of the chair while keeping your heel down. 4. Hold this stretch for __________ seconds. Repeat __________ times. Complete this exercise __________ times a day. Strengthening exercises These exercises build strength and endurance in your ankle. Endurance is the ability to use your muscles for a long time, even after they get tired. Dorsiflexors These are muscles that lift your foot up. 1. Secure a rubber exercise band or tube to an object, such as a table leg, that will stay still when the band is pulled. Secure the other end around your __________ foot. 2. Sit on the floor, facing the object with your __________ leg extended. The band or tube should be slightly tense when your foot is relaxed. 3. Slowly flex your __________ ankle and toes to bring your foot toward your shin. 4. Hold this position for __________ seconds. 5. Slowly return your foot to the starting position, controlling the band as you do that. Repeat __________ times. Complete this exercise __________ times a day. Plantar flexors These are muscles that push your foot down. 1. Sit on the floor with your __________ leg extended. 2. Loop a rubber exercise band or tube around the ball of your __________ foot. The ball of your foot is on the walking surface, right under your toes. The band or tube should be slightly tense when your  foot is relaxed. 3. Slowly point your toes downward, pushing them away from you. 4. Hold this position for __________ seconds. 5. Slowly release the tension in the band or tube, controlling smoothly until your foot is back in the starting position. Repeat __________ times. Complete this exercise __________ times a day. Towel curls  1. Sit in a chair on a non-carpeted surface, and put your feet on the  floor. 2. Place a towel in front of your feet. If told by your health care provider, add a __________ pound weight to the end of the towel. 3. Keeping your heel on the floor, put your __________ foot on the towel. 4. Pull the towel toward you by grabbing the towel with your toes and curling them under. Keep your heel on the floor. 5. Let your toes relax. 6. Grab the towel again. Keep pulling the towel until it is completely underneath your foot. Repeat __________ times. Complete this exercise __________ times a day. Standing plantar flexion This is an exercise in which you use your toes to lift your body's weight while standing. 1. Stand with your feet shoulder-width apart. 2. Keep your weight spread evenly over the width of your feet while you rise up on your toes. Use a wall or table to steady yourself if needed, but try not to use it for support. 3. If this exercise is too easy, try these options: ? Shift your weight toward your __________ leg until you feel challenged. ? If told by your health care provider, lift your uninjured leg off the floor. 4. Hold this position for __________ seconds. Repeat __________ times. Complete this exercise __________ times a day. Tandem walking 1. Stand with one foot directly in front of the other. 2. Slowly raise your back foot up, lifting your heel before your toes, and place it directly in front of your other foot. 3. Continue to walk in this heel-to-toe way for __________ or for as long as told by your health care provider. Have a countertop or wall nearby to use if needed to keep your balance, but try not to hold onto anything for support. Repeat __________ times. Complete this exercise __________ times a day. This information is not intended to replace advice given to you by your health care provider. Make sure you discuss any questions you have with your health care provider. Document Released: 02/04/2005 Document Revised: 12/18/2017 Document  Reviewed: 12/20/2017 Elsevier Patient Education  2020 [employer].

## 2019-03-14 ENCOUNTER — Ambulatory Visit: Payer: Self-pay | Admitting: Rheumatology

## 2019-04-06 ENCOUNTER — Encounter: Payer: Self-pay | Admitting: Family Medicine

## 2019-04-06 ENCOUNTER — Ambulatory Visit: Payer: 59 | Admitting: Family Medicine

## 2019-04-06 ENCOUNTER — Other Ambulatory Visit: Payer: Self-pay

## 2019-04-06 VITALS — BP 111/70 | HR 65 | Temp 97.5°F | Resp 16 | Ht 65.0 in | Wt 144.0 lb

## 2019-04-06 DIAGNOSIS — R3989 Other symptoms and signs involving the genitourinary system: Secondary | ICD-10-CM | POA: Diagnosis not present

## 2019-04-06 DIAGNOSIS — R350 Frequency of micturition: Secondary | ICD-10-CM | POA: Diagnosis not present

## 2019-04-06 LAB — POCT URINALYSIS DIP (MANUAL ENTRY)
Bilirubin, UA: NEGATIVE
Blood, UA: NEGATIVE
Glucose, UA: NEGATIVE mg/dL
Ketones, POC UA: NEGATIVE mg/dL
Leukocytes, UA: NEGATIVE
Nitrite, UA: NEGATIVE
Protein Ur, POC: NEGATIVE mg/dL
Spec Grav, UA: 1.015 (ref 1.010–1.025)
Urobilinogen, UA: 0.2 E.U./dL
pH, UA: 7.5 (ref 5.0–8.0)

## 2019-04-06 NOTE — Patient Instructions (Addendum)
  Continue to try to drink plenty of fluids.  If symptoms persist contact your gynecologist to see if they can send you over to a urologist or your gynecologist so you could get a more thorough evaluation of this problem.  You might try and get the name of the person that you friend utilized.  In the event of worsening symptoms like fever or chills or urinating blood or other concerns get rechecked sooner.   If you have lab work done today you will be contacted with your lab results within the next 2 weeks.  If you have not heard from [place] then please contact us. The fastest way to get your results is to register for My Chart.   IF you received an x-ray today, you will receive an invoice from [facility]. Please contact [facility] at [phone] with questions or concerns regarding your invoice.   IF you received labwork today, you will receive an invoice from [place]. Please contact [facility] at [phone] with questions or concerns regarding your invoice.   Our billing staff will not be able to assist you with questions regarding bills from these companies.  You will be contacted with the lab results as soon as they are available. The fastest way to get your results is to activate your My Chart account. Instructions are located on the last page of this paperwork. If you have not heard from [place] regarding the results in 2 weeks, please contact this office.

## 2019-04-06 NOTE — Progress Notes (Signed)
Patient ID: Alexis Hicks, female    DOB: Nov 10, 1960  Age: 58 y.o. MRN: [id number]  Chief Complaint  Patient presents with  . Urinary Tract Infection    patient stated that she is having frequent urination , and also alot of pressure. Per patient this have been ongoing for about 2 weeks. Not taking anything OTC.    Subjective:   Patient has been having trouble with a lot of urinary frequency over the past couple of weeks.  Not really having dysuria.  She occasionally does not hold her urine well.  She has thought of waiting till she could see her gynecologist in February, but decided she needed to go and get checked for an infection.  Current allergies, medications, problem list, past/family and social histories reviewed.  Objective:  BP 111/70   Pulse 65   Temp (!) 97.5 F (36.4 C) (Temporal)   Resp 16   Ht 5\' 5"  (1.651 m)   Wt 144 lb (65.3 kg)   SpO2 98%   BMI 23.96 kg/m   No CVA or abdominal tenderness. Results for orders placed or performed in visit on 04/06/19  POCT urinalysis dipstick  Result Value Ref Range   Color, UA colorless (A) yellow   Clarity, UA clear clear   Glucose, UA negative negative mg/dL   Bilirubin, UA negative negative   Ketones, POC UA negative negative mg/dL   Spec Grav, UA 1.015 1.010 - 1.025   Blood, UA negative negative   pH, UA 7.5 5.0 - 8.0   Protein Ur, POC negative negative mg/dL   Urobilinogen, UA 0.2 0.2 or 1.0 E.U./dL   Nitrite, UA Negative Negative   Leukocytes, UA Negative Negative   Patient ID: Alexis Hicks, female    DOB: 05/31/1960  Age: 58 y.o. MRN: [id number]  Chief Complaint  Patient presents with  . Urinary Tract Infection    patient stated that she is having frequent urination , and also alot of pressure. Per patient this have been ongoing for about 2 weeks. Not taking anything OTC.    Subjective:   See above  Current allergies, medications, problem list, past/family and social histories  reviewed.  Objective:  BP 111/70   Pulse 65   Temp (!) 97.5 F (36.4 C) (Temporal)   Resp 16   Ht 5\' 5"  (1.651 m)   Wt 144 lb (65.3 kg)   SpO2 98%   BMI 23.96 kg/m   See above  Assessment & Plan:   Assessment: 1. Urinary frequency   2. Possible urinary tract infection       Plan: Decided to culture the urine even though it looks clear.  She may well need to see gynecologist or your gynecologist, but will give it a little more time first.  See instructions.  Orders Placed This Encounter  Procedures  . Urine culture    Order Specific Question:   Source    Answer:   urine  . POCT urinalysis dipstick    No orders of the defined types were placed in this encounter.        Patient Instructions    Continue to try to drink plenty of fluids.  If symptoms persist contact your gynecologist to see if they can send you over to a urologist or your gynecologist so you could get a more thorough evaluation of this problem.  You might try and get the name of the person that you friend utilized.  In the event of worsening symptoms like fever or  chills or urinating blood or other concerns get rechecked sooner.   If you have lab work done today you will be contacted with your lab results within the next 2 weeks.  If you have not heard from [place] then please contact us. The fastest way to get your results is to register for My Chart.   IF you received an x-ray today, you will receive an invoice from [facility]. Please contact [facility] at [phone] with questions or concerns regarding your invoice.   IF you received labwork today, you will receive an invoice from [facility]. Please contact [facility] at [phone] with questions or concerns regarding your invoice.   Our billing staff will not be able to assist you with questions regarding bills from these companies.  You will be contacted with the lab results as soon as they are available. The fastest way  to get your results is to activate your My Chart account. Instructions are located on the last page of this paperwork. If you have not heard from [place] regarding the results in 2 weeks, please contact this office.        Return if symptoms worsen or fail to improve.   Ruben Reason, MD 04/06/2019 Assessment & Plan:   Assessment: 1. Urinary frequency   2. Possible urinary tract infection       Plan: See above  Orders Placed This Encounter  Procedures  . Urine culture    Order Specific Question:   Source    Answer:   urine  . POCT urinalysis dipstick    No orders of the defined types were placed in this encounter.        Patient Instructions    Continue to try to drink plenty of fluids.  If symptoms persist contact your gynecologist to see if they can send you over to a urologist or your gynecologist so you could get a more thorough evaluation of this problem.  You might try and get the name of the person that you friend utilized.  In the event of worsening symptoms like fever or chills or urinating blood or other concerns get rechecked sooner.   If you have lab work done today you will be contacted with your lab results within the next 2 weeks.  If you have not heard from [place] then please contact us. The fastest way to get your results is to register for My Chart.   IF you received an x-ray today, you will receive an invoice from [facility]. Please contact [facility] at [phone] with questions or concerns regarding your invoice.   IF you received labwork today, you will receive an invoice from [place]. Please contact [facility] at [phone] with questions or concerns regarding your invoice.   Our billing staff will not be able to assist you with questions regarding bills from these companies.  You will be contacted with the lab results as soon as they are available. The fastest way to get your results is to activate your My Chart account.  Instructions are located on the last page of this paperwork. If you have not heard from [place] regarding the results in 2 weeks, please contact this office.        Return if symptoms worsen or fail to improve.   Ruben Reason, MD 04/06/2019

## 2019-04-07 LAB — URINE CULTURE: Organism ID, Bacteria: NO GROWTH

## 2019-06-23 LAB — HM PAP SMEAR: HM Pap smear: NORMAL

## 2019-07-10 NOTE — Progress Notes (Signed)
Office Visit Note  Patient: Alexis Hicks             Date of Birth: 1960-12-20           MRN: [id number]             PCP: Delsa Bern, MD Referring: Delsa Bern, MD Visit Date: 07/12/2019 Occupation: @GUAROCC @  Subjective:  Other (patient started Boniva per GYN)   History of Present Illness: Alexis Hicks is a 59 y.o. female with history of osteoarthritis, fibromyalgia and osteoporosis.  Patient states she had a bone density in 2020 by her GYN which was consistent with osteoporosis and showed deterioration of BMD.  She was advised to start on Boniva.  She states she took 1 dose of Boniva last year and could not tolerate it.  She is taken 1 dose this year and she had some GI symptoms and muscle aches.  She has been advised by her GYN to stick to [facility] and the symptoms may resolve over time.  She has been exercising about 30 minutes daily and doing some weightlifting.  She is also done some dietary modifications.  She wanted to know if there would be other treatment options.  She continues to have some stiffness in her joints due to underlying osteoarthritis.  She states her fibromyalgia is not very bothersome.  Her C-spine and thoracolumbar spine continues to give her some trouble.  Activities of Daily Living:  Patient reports morning stiffness for 5-10 minutes.   Patient Denies nocturnal pain.  Difficulty dressing/grooming: Denies Difficulty climbing stairs: Denies Difficulty getting out of chair: Denies Difficulty using hands for taps, buttons, cutlery, and/or writing: Reports  Review of Systems  Constitutional: Negative for fatigue, night sweats, weight gain and weight loss.  HENT: Negative for mouth sores, trouble swallowing, trouble swallowing, mouth dryness and nose dryness.   Eyes: Positive for itching and dryness. Negative for pain, redness and visual disturbance.  Respiratory: Negative for cough, shortness of breath and difficulty breathing.     Cardiovascular: Negative for chest pain, palpitations, hypertension, irregular heartbeat and swelling in legs/feet.  Gastrointestinal: Negative for blood in stool, constipation and diarrhea.  Endocrine: Negative for increased urination.  Genitourinary: Negative for difficulty urinating and vaginal dryness.  Musculoskeletal: Positive for myalgias, morning stiffness and myalgias. Negative for arthralgias, joint pain, joint swelling, muscle weakness and muscle tenderness.  Skin: Positive for rash. Negative for color change, hair loss, skin tightness, ulcers and sensitivity to sunlight.       eczema  Allergic/Immunologic: Negative for susceptible to infections.  Neurological: Negative for dizziness, numbness, headaches, memory loss, night sweats and weakness.  Hematological: Negative for bruising/bleeding tendency and swollen glands.  Psychiatric/Behavioral: Negative for depressed mood, confusion and sleep disturbance. The patient is not nervous/anxious.     PMFS History:  Patient Active Problem List   Diagnosis Date Noted  . Osteopenia of multiple sites 08/25/2016  . Vitamin D deficiency 08/25/2016  . Fatigue 02/17/2016  . Primary osteoarthritis of both feet 02/17/2016  . 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 Osteoarthritis of lumbar spine 02/17/2016  . Weakness of right lower extremity 02/17/2016  . Migraine without status migrainosus, not intractable 02/17/2016  . Raynaud's  02/17/2016  . Sicca syndrome 02/17/2016  . DUB (dysfunctional uterine bleeding)   . Fibromyalgia syndrome 09/10/2011  . Primary osteoarthritis of both hands 09/10/2011  . Scoliosis 09/10/2011    Past Medical History:  Diagnosis Date  . Chronically dry eyes, right   . Dry mouth   .  DUB (dysfunctional uterine bleeding)   . Fibromyalgia   . Osteoporosis 04/2018   Dr. Butler [place]  . Raynaud's syndrome     Family History  Problem Relation Age of Onset  . Breast cancer Paternal Aunt   . Breast cancer Paternal Grandmother    . Arthritis Mother   . Osteoporosis Mother   . Dementia Mother   . Skin cancer Father   . Depression Maternal Grandmother   . Hypertension Maternal Grandmother   . Stroke Maternal Grandmother   . Kidney disease Maternal Grandfather   . Uterine cancer Maternal Aunt   . Metabolic syndrome Brother   . Diabetes Brother   . Hypertension Brother   . High Cholesterol Brother   . Stroke Brother    Past Surgical History:  Procedure Laterality Date  . SALPINGECTOMY     Social History   Social History Narrative  . Not on file   Immunization History  Administered Date(s) Administered  . Influenza-Unspecified 01/11/2019     Objective: Vital Signs: BP 111/74 (BP Location: Right Arm, Patient Position: Sitting, Cuff Size: Normal)   Pulse 60   Resp 14   Ht 5\' 5"  (1.651 m)   Wt 147 lb 3.2 oz (66.8 kg)   BMI 24.50 kg/m    Physical Exam Vitals and nursing note reviewed.  Constitutional:      Appearance: She is well-developed.  HENT:     Head: Normocephalic and atraumatic.  Eyes:     Conjunctiva/sclera: Conjunctivae normal.  Cardiovascular:     Rate and Rhythm: Normal rate and regular rhythm.     Heart sounds: Normal heart sounds.  Pulmonary:     Effort: Pulmonary effort is normal.     Breath sounds: Normal breath sounds.  Abdominal:     General: Bowel sounds are normal.     Palpations: Abdomen is soft.  Musculoskeletal:     Cervical back: Normal range of motion.  Lymphadenopathy:     Cervical: No cervical adenopathy.  Skin:    General: Skin is warm and dry.     Capillary Refill: Capillary refill takes less than 2 seconds.  Neurological:     Mental Status: She is alert and oriented to person, place, and time.  Psychiatric:        Behavior: Behavior normal.      Musculoskeletal Exam: C-spine, thoracic and lumbar spine with good range of motion.  Shoulder joints, elbow joints, wrist joints, MCPs PIPs DIPs with good range of motion with no synovitis.  Hip joints, knee  joints, ankles, MTPs and PIPs in good range of motion with no synovitis.  She had very few tender points today.  CDAI Exam: CDAI Score: -- Patient Global: --; Provider Global: -- Swollen: --; Tender: -- Joint Exam 07/12/2019   No joint exam has been documented for this visit   There is currently no information documented on the homunculus. Go to the Rheumatology activity and complete the homunculus joint exam.  Investigation: No additional findings.  Imaging: No results found.  Recent Labs: No results found for: WBC, HGB, PLT, NA, K, CL, CO2, GLUCOSE, BUN, CREATININE, BILITOT, ALKPHOS, AST, ALT, PROT, ALBUMIN, CALCIUM, GFRAA, QFTBGOLD, QFTBGOLDPLUS  Speciality Comments: No specialty comments available.  Procedures:  No procedures performed Allergies: Patient has no known allergies.   Assessment / Plan:     Visit Diagnoses: Other osteoporosis without current pathological fracture - DEXA ordered by gynecologist.  According to the patient she was diagnosed with osteoporosis in March 2020.  I  do not have bone density results available.  We had previous bone density from 2018 with a T score of -2.4.  I had detailed discussion with the patient regarding osteoporosis and different treatment options.  I am in agreement with her GYN that Jaclyn Prime will be the best choice for her if she could tolerate over time.  The side effects may reduce over time.  If she had intolerance in the future IV Reclast could be an option or Prolia could be an option.  At this point patient is convinced and will stay on Boniva.  Use of calcium and vitamin D was also emphasized.  Need for resistive exercises and regular aerobic exercise was also discussed at length.  Vitamin D deficiency-she is on vitamin D supplement.  DDD (degenerative disc disease), cervical-she continues to have some cervical spine spine discomfort.  Other form of scoliosis of thoracolumbar spine-she has been doing some stretching  exercises.  Spondylosis without myelopathy or radiculopathy, lumbar region-she has chronic lower back pain.  Primary osteoarthritis of both hands-joint protection was discussed.  Primary osteoarthritis of both feet-proper fitting shoes were discussed.  Sicca syndrome-she has been using Restasis.  Fibromyalgia syndrome-currently not having much discomfort from fibromyalgia.  Other fatigue  History of migraine  Orders: No orders of the defined types were placed in this encounter.  No orders of the defined types were placed in this encounter.     Follow-Up Instructions: Return in about 6 months (around 01/11/2020) for Osteoarthritis, Osteoporosis, FMS.   Bo Merino, MD  Note - This record has been created using Editor, commissioning.  Chart creation errors have been sought, but may not always  have been located. Such creation errors do not reflect on  the standard of medical care.

## 2019-07-12 ENCOUNTER — Encounter: Payer: Self-pay | Admitting: Rheumatology

## 2019-07-12 ENCOUNTER — Other Ambulatory Visit: Payer: Self-pay

## 2019-07-12 ENCOUNTER — Ambulatory Visit: Payer: 59 | Admitting: Rheumatology

## 2019-07-12 VITALS — BP 111/74 | HR 60 | Resp 14 | Ht 65.0 in | Wt 147.2 lb

## 2019-07-12 DIAGNOSIS — M3500 Sicca syndrome, unspecified: Secondary | ICD-10-CM

## 2019-07-12 DIAGNOSIS — R5383 Other fatigue: Secondary | ICD-10-CM

## 2019-07-12 DIAGNOSIS — Z8669 Personal history of other diseases of the nervous system and sense organs: Secondary | ICD-10-CM

## 2019-07-12 DIAGNOSIS — M19041 Primary osteoarthritis, right hand: Secondary | ICD-10-CM

## 2019-07-12 DIAGNOSIS — E559 Vitamin D deficiency, unspecified: Secondary | ICD-10-CM | POA: Diagnosis not present

## 2019-07-12 DIAGNOSIS — M19071 Primary osteoarthritis, right ankle and foot: Secondary | ICD-10-CM

## 2019-07-12 DIAGNOSIS — M5030 Other cervical disc degeneration, unspecified cervical region: Secondary | ICD-10-CM | POA: Diagnosis not present

## 2019-07-12 DIAGNOSIS — M19072 Primary osteoarthritis, left ankle and foot: Secondary | ICD-10-CM

## 2019-07-12 DIAGNOSIS — M797 Fibromyalgia: Secondary | ICD-10-CM

## 2019-07-12 DIAGNOSIS — M818 Other osteoporosis without current pathological fracture: Secondary | ICD-10-CM | POA: Diagnosis not present

## 2019-07-12 DIAGNOSIS — M4185 Other forms of scoliosis, thoracolumbar region: Secondary | ICD-10-CM | POA: Diagnosis not present

## 2019-07-12 DIAGNOSIS — M19042 Primary osteoarthritis, left hand: Secondary | ICD-10-CM

## 2019-07-12 DIAGNOSIS — M47816 Spondylosis without myelopathy or radiculopathy, lumbar region: Secondary | ICD-10-CM

## 2019-11-17 LAB — FECAL OCCULT BLOOD, GUAIAC: Fecal Occult Blood: NEGATIVE

## 2019-12-28 NOTE — Progress Notes (Signed)
Office Visit Note  Patient: Alexis Hicks             Date of Birth: [birth year]           MRN: [id number]             PCP: Delsa Bern, MD Referring: Delsa Bern, MD Visit Date: 01/11/2020 Occupation: @GUAROCC @  Subjective:  Neck and back and stiffness.   History of Present Illness: Alexis Hicks is a 59 y.o. female with history of degenerative disc disease, osteoarthritis, fibromyalgia and osteoporosis.  She states she switched to Actonel per GYN guidelines.  She has been tolerating Actonel better than Boniva.  She has been going to physical therapy for balance and strengthening.  Which has been helpful.  They also work on her C-spine.  She continues to have some stiffness in her neck and lower back.  He had some discomfort in her hands with the weather change.  Although the discomfort has improved now.  Activities of Daily Living:  Patient reports morning stiffness for 5 minutes.   Patient Denies nocturnal pain.  Difficulty dressing/grooming: Denies Difficulty climbing stairs: Denies Difficulty getting out of chair: Denies Difficulty using hands for taps, buttons, cutlery, and/or writing: Reports  Review of Systems  Constitutional: Negative for fatigue.  HENT: Negative for mouth sores, mouth dryness and nose dryness.   Eyes: Negative for pain, itching and dryness.  Respiratory: Negative for cough, hemoptysis, shortness of breath and difficulty breathing.   Cardiovascular: Negative for chest pain, palpitations and swelling in legs/feet.  Gastrointestinal: Negative for abdominal pain, blood in stool, constipation and diarrhea.  Endocrine: Negative for increased urination.  Genitourinary: Negative for painful urination.  Musculoskeletal: Positive for arthralgias, joint pain, myalgias, muscle weakness, morning stiffness, muscle tenderness and myalgias. Negative for joint swelling.  Skin: Negative for color change, rash and redness.  Allergic/Immunologic: Negative  for susceptible to infections.  Neurological: Negative for dizziness, headaches, memory loss and weakness.  Hematological: Negative for swollen glands.  Psychiatric/Behavioral: Negative for depressed mood, confusion and sleep disturbance. The patient is not nervous/anxious.     PMFS History:  Patient Active Problem List   Diagnosis Date Noted   Osteopenia of multiple sites 08/25/2016   Vitamin D deficiency 08/25/2016   Fatigue 02/17/2016   Primary osteoarthritis of both feet 02/17/2016   DDD Cspine 02/17/2016   Osteoarthritis of lumbar spine 02/17/2016   Weakness of right lower extremity 02/17/2016   Migraine without status migrainosus, not intractable 02/17/2016   Raynaud's  02/17/2016   Sicca syndrome 02/17/2016   DUB (dysfunctional uterine bleeding)    Fibromyalgia syndrome 09/10/2011   Primary osteoarthritis of both hands 09/10/2011   Scoliosis 09/10/2011    Past Medical History:  Diagnosis Date   Chronically dry eyes, right    Dry mouth    DUB (dysfunctional uterine bleeding)    Fibromyalgia    Osteoporosis 04/2018   Dr. Butler [place]   Raynaud's syndrome     Family History  Problem Relation Age of Onset   Breast cancer Paternal 71    Breast cancer Paternal Grandmother    Arthritis Mother    Osteoporosis Mother    Dementia Mother    Skin cancer Father    Depression Maternal Grandmother    Hypertension Maternal Grandmother    Stroke Maternal Grandmother    Kidney disease Maternal Grandfather    Uterine cancer Maternal Aunt    Metabolic syndrome Brother    Diabetes Brother    Hypertension Brother  High Cholesterol Brother    Stroke Brother    Past Surgical History:  Procedure Laterality Date   SALPINGECTOMY     Social History   Social History Narrative   Not on file   Immunization History  Administered Date(s) Administered   Influenza-Unspecified 01/11/2019   PFIZER SARS-COV-2 Vaccination 06/16/2019, 07/10/2019       Objective: Vital Signs: BP 100/65 (BP Location: Left Arm, Patient Position: Sitting, Cuff Size: Small)    Pulse 63    Ht 5\' 5"  (1.651 m)    Wt 143 lb 12.8 oz (65.2 kg)    BMI 23.93 kg/m    Physical Exam Vitals and nursing note reviewed.  Constitutional:      Appearance: She is well-developed.  HENT:     Head: Normocephalic and atraumatic.  Eyes:     Conjunctiva/sclera: Conjunctivae normal.  Cardiovascular:     Rate and Rhythm: Normal rate and regular rhythm.     Heart sounds: Normal heart sounds.  Pulmonary:     Effort: Pulmonary effort is normal.     Breath sounds: Normal breath sounds.  Abdominal:     General: Bowel sounds are normal.     Palpations: Abdomen is soft.  Musculoskeletal:     Cervical back: Normal range of motion.  Lymphadenopathy:     Cervical: No cervical adenopathy.  Skin:    General: Skin is warm and dry.     Capillary Refill: Capillary refill takes less than 2 seconds.  Neurological:     Mental Status: She is alert and oriented to person, place, and time.  Psychiatric:        Behavior: Behavior normal.      Musculoskeletal Exam: C-spine thoracic and lumbar spine with good range of motion.  Shoulder joints, elbow joints, wrist joints, MCPs PIPs and DIPs with good range of motion with no synovitis.  Hip joints, knee joints, ankles, MTPs and PIPs with good range of motion with no synovitis.  CDAI Exam: CDAI Score: -- Patient Global: --; Provider Global: -- Swollen: --; Tender: -- Joint Exam 01/11/2020   No joint exam has been documented for this visit   There is currently no information documented on the homunculus. Go to the Rheumatology activity and complete the homunculus joint exam.  Investigation: No additional findings.  Imaging: No results found.  Recent Labs: No results found for: WBC, HGB, PLT, NA, K, CL, CO2, GLUCOSE, BUN, CREATININE, BILITOT, ALKPHOS, AST, ALT, PROT, ALBUMIN, CALCIUM, GFRAA, QFTBGOLD, QFTBGOLDPLUS  Speciality  Comments: No specialty comments available.  Procedures:  No procedures performed Allergies: Patient has no known allergies.   Assessment / Plan:     Visit Diagnoses: Other osteoporosis without current pathological fracture - DEXA ordered by gynecologist.  She could not tolerate Boniva.  She is on Actonel now which she is tolerating well.  Prescribed by her GYN.  Vitamin D deficiency-patient is on supplement and has been tolerating it well.  DDD (degenerative disc disease), cervical - going to physical therapy which has been helpful.  Other form of scoliosis of thoracolumbar spine-she has some stiffness in her lower back.  Have given her a handout on exercises.  Spondylosis without myelopathy or radiculopathy, lumbar region  Primary osteoarthritis of both hands-joint protection muscle strengthening was discussed.  Primary osteoarthritis of both feet-doing better with proper fitting shoes.  Sicca syndrome-she has been using over-the-counter products which has been helpful.  Fibromyalgia syndrome-she continues to have some generalized pain.  She is going to physical therapy for  balance and lower extremity muscle strengthening.  She states has been very helpful.  Other fatigue-related to fibromyalgia.  History of migraine  Educated about COVID-19 virus infection-she is fully vaccinated against COVID-19.  Use of booster when it is available to her was discussed.  Use of mask, social distancing and hand hygiene was discussed.  Use of monoclonal antibodies was discussed in case she develops COVID-19 infection.  Orders: No orders of the defined types were placed in this encounter.  No orders of the defined types were placed in this encounter.   Follow-Up Instructions: Return in about 6 months (around 07/11/2020) for Osteoarthritis, Osteoporosis.   Bo Merino, MD  Note - This record has been created using Editor, commissioning.  Chart creation errors have been sought, but may not  always  have been located. Such creation errors do not reflect on  the standard of medical care.

## 2020-01-11 ENCOUNTER — Encounter: Payer: Self-pay | Admitting: Rheumatology

## 2020-01-11 ENCOUNTER — Ambulatory Visit: Payer: 59 | Admitting: Rheumatology

## 2020-01-11 ENCOUNTER — Other Ambulatory Visit: Payer: Self-pay

## 2020-01-11 VITALS — BP 100/65 | HR 63 | Ht 65.0 in | Wt 143.8 lb

## 2020-01-11 DIAGNOSIS — M818 Other osteoporosis without current pathological fracture: Secondary | ICD-10-CM | POA: Diagnosis not present

## 2020-01-11 DIAGNOSIS — R5383 Other fatigue: Secondary | ICD-10-CM

## 2020-01-11 DIAGNOSIS — Z7189 Other specified counseling: Secondary | ICD-10-CM

## 2020-01-11 DIAGNOSIS — M19072 Primary osteoarthritis, left ankle and foot: Secondary | ICD-10-CM

## 2020-01-11 DIAGNOSIS — M19041 Primary osteoarthritis, right hand: Secondary | ICD-10-CM

## 2020-01-11 DIAGNOSIS — M19071 Primary osteoarthritis, right ankle and foot: Secondary | ICD-10-CM

## 2020-01-11 DIAGNOSIS — Z8669 Personal history of other diseases of the nervous system and sense organs: Secondary | ICD-10-CM

## 2020-01-11 DIAGNOSIS — M5030 Other cervical disc degeneration, unspecified cervical region: Secondary | ICD-10-CM | POA: Diagnosis not present

## 2020-01-11 DIAGNOSIS — M797 Fibromyalgia: Secondary | ICD-10-CM

## 2020-01-11 DIAGNOSIS — M19042 Primary osteoarthritis, left hand: Secondary | ICD-10-CM

## 2020-01-11 DIAGNOSIS — E559 Vitamin D deficiency, unspecified: Secondary | ICD-10-CM | POA: Diagnosis not present

## 2020-01-11 DIAGNOSIS — M4185 Other forms of scoliosis, thoracolumbar region: Secondary | ICD-10-CM | POA: Diagnosis not present

## 2020-01-11 DIAGNOSIS — M3500 Sicca syndrome, unspecified: Secondary | ICD-10-CM

## 2020-01-11 DIAGNOSIS — M47816 Spondylosis without myelopathy or radiculopathy, lumbar region: Secondary | ICD-10-CM

## 2020-01-11 NOTE — Patient Instructions (Signed)

## 2020-03-09 ENCOUNTER — Ambulatory Visit: Payer: 59 | Attending: Internal Medicine

## 2020-03-09 DIAGNOSIS — Z23 Encounter for immunization: Secondary | ICD-10-CM

## 2020-03-09 NOTE — Progress Notes (Signed)
   Covid-19 Vaccination Clinic  Name:  Brya Simerly    MRN: [id number] DOB: 04-Apr-1961  03/09/2020  Ms. Paradiso was observed post Covid-19 immunization for 15 minutes without incident. She was provided with Vaccine Information Sheet and instruction to access the V-Safe system.   Ms. Bieda was instructed to call 911 with any severe reactions post vaccine: Marland Kitchen Difficulty breathing  . Swelling of face and throat  . A fast heartbeat  . A bad rash all over body  . Dizziness and weakness   Immunizations Administered    Name Date Dose VIS Date Route   Pfizer COVID-19 Vaccine 03/09/2020  9:55 AM 0.3 mL 01/24/2020 Intramuscular   Manufacturer: [place]   Lot: [id number]   NDC: [address]-8

## 2020-03-12 ENCOUNTER — Telehealth: Payer: Self-pay

## 2020-03-12 NOTE — Telephone Encounter (Signed)
Patient positive for COVID. Wants to know if she should get the infusion of medication. No established primary care provider (not established with [place]). Please advise at [phone]

## 2020-03-12 NOTE — Telephone Encounter (Signed)
Please schedule patient for a virtual appt for evaluation by a physician for possible need for antiviral covid infusion per message received.

## 2020-03-13 NOTE — Telephone Encounter (Signed)
Pt is not established with our office.

## 2020-05-10 ENCOUNTER — Telehealth: Payer: Self-pay | Admitting: *Deleted

## 2020-05-10 NOTE — Telephone Encounter (Signed)
Labs received from:[facility] Drawn on:05/02/2020 Reviewed by: Dr. Bo Merino  Labs drawn:CBC, BMP, Hepatic Function, TSH  Results:Will follow  Alkaline Phosphatase 151

## 2020-05-22 ENCOUNTER — Telehealth: Payer: Self-pay | Admitting: *Deleted

## 2020-05-22 NOTE — Telephone Encounter (Signed)
Labs received from:[facility] Drawn on:05/02/2020 Reviewed by:Hazel Sams, PA-C  Labs drawn:Anti-Smooth Muscle/Mitochond, Anti-Mitochondrial Ab by IFA, GGT, 5'Nucleotidase, Antinuclear Antibodies, IFA,    Results:normal

## 2020-06-27 ENCOUNTER — Ambulatory Visit (HOSPITAL_COMMUNITY)
Admission: EM | Admit: 2020-06-27 | Discharge: 2020-06-27 | Disposition: A | Discharge status: Home / Self Care | Payer: 59 | Attending: Internal Medicine | Admitting: Internal Medicine

## 2020-06-27 ENCOUNTER — Other Ambulatory Visit: Payer: Self-pay

## 2020-06-27 ENCOUNTER — Encounter (HOSPITAL_COMMUNITY): Payer: Self-pay

## 2020-06-27 DIAGNOSIS — M940 Chondrocostal junction syndrome [Tietze]: Secondary | ICD-10-CM

## 2020-06-27 DIAGNOSIS — R079 Chest pain, unspecified: Secondary | ICD-10-CM | POA: Diagnosis not present

## 2020-06-27 MED ORDER — IBUPROFEN 400 MG PO TABS: 400.0000 mg | ORAL_TABLET | Freq: Four times a day (QID) | ORAL | 0 refills | Status: DC | PRN

## 2020-06-27 NOTE — ED Provider Notes (Signed)
[place]    CSN: [id number] Arrival date & time: 06/27/20  1128      History   Chief Complaint Chief Complaint  Patient presents with  . Chest Pain    HPI Alexis Hicks is a 60 y.o. female comes to the urgent care with complaints of sharp, intermittent, right-sided chest pain of 1 day duration.  Pain is aggravated by movement.  Pain resolved spontaneously.  Patient took some Advil which helped with resolution of the pain.  No diaphoresis, nausea or vomiting.  No trauma to the chest.  No dizziness, syncope or near syncopal episode.   HPI  Past Medical History:  Diagnosis Date  . Chronically dry eyes, right   . Dry mouth   . DUB (dysfunctional uterine bleeding)   . Fibromyalgia   . Osteoporosis 04/2018   Dr. Butler Denmark  . Raynaud's syndrome     Patient Active Problem List   Diagnosis Date Noted  . Osteopenia of multiple sites 08/25/2016  . Vitamin D deficiency 08/25/2016  . Fatigue 02/17/2016  . Primary osteoarthritis of both feet 02/17/2016  . 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 Osteoarthritis of lumbar spine 02/17/2016  . Weakness of right lower extremity 02/17/2016  . Migraine without status migrainosus, not intractable 02/17/2016  . Raynaud's  02/17/2016  . Sicca syndrome 02/17/2016  . DUB (dysfunctional uterine bleeding)   . Fibromyalgia syndrome 09/10/2011  . Primary osteoarthritis of both hands 09/10/2011  . Scoliosis 09/10/2011    Past Surgical History:  Procedure Laterality Date  . SALPINGECTOMY      OB History    Gravida  3   Para  2   Term      Preterm      AB      Living        SAB      IAB      Ectopic      Multiple      Live Births               Home Medications    Prior to Admission medications   Medication Sig Start Date End Date Taking? Authorizing Provider  ibuprofen (ADVIL) 400 MG tablet Take 1 tablet (400 mg total) by mouth every 6 (six) hours as needed. 06/27/20  Yes Lashaya Kienitz, Myrene Galas, MD  ascorbic Acid  (VITAMIN C) 500 MG CPCR as needed.    [provider]  Calcium Carbonate-Vitamin D (CALCIUM 600+D HIGH POTENCY PO) calcium    [provider]  cholecalciferol (VITAMIN D) 1000 units tablet Take 2,000 Units by mouth daily.     [provider]  Cyanocobalamin (VITAMIN B 12 PO) Take by mouth.    [provider]  cycloSPORINE (RESTASIS) 0.05 % ophthalmic emulsion 1 drop daily.    [provider]  fluticasone (FLONASE) 50 MCG/ACT nasal spray Place into both nostrils daily.    [provider]  risedronate (ACTONEL) 150 MG tablet Take 150 mg by mouth every 30 (thirty) days. 01/11/20   [provider]  triamcinolone cream (KENALOG) 0.1 % triamcinolone acetonide 0.1 % topical cream  APPLY TO SKIN TWICE DAILY X 14 DAYS    [provider]    Family History Family History  Problem Relation Age of Onset  . Breast cancer Paternal Aunt   . Breast cancer Paternal Grandmother   . Arthritis Mother   . Osteoporosis Mother   . Dementia Mother   . Skin cancer Father   . Depression Maternal Grandmother   .  Hypertension Maternal Grandmother   . Stroke Maternal Grandmother   . Kidney disease Maternal Grandfather   . Uterine cancer Maternal Aunt   . Metabolic syndrome Brother   . Diabetes Brother   . Hypertension Brother   . High Cholesterol Brother   . Stroke Brother     Social History Social History   Tobacco Use  . Smoking status: Former Smoker    Packs/day: 0.05    Years: 0.75    Pack years: 0.03    Types: Cigarettes  . Smokeless tobacco: Never Used  Vaping Use  . Vaping Use: Never used  Substance Use Topics  . Alcohol use: Yes    Comment: 0-1 glasses of wine/week or less  . Drug use: No     Allergies   Patient has no known allergies.   Review of Systems Review of Systems  Constitutional: Negative.   HENT: Negative.   Respiratory: Negative for shortness of breath and wheezing.   Cardiovascular: Positive for  chest pain. Negative for palpitations.  Musculoskeletal: Negative.   Neurological: Negative.      Physical Exam Triage Vital Signs ED Triage Vitals  Enc Vitals Group     BP 06/27/20 1145 121/71     Pulse Rate 06/27/20 1145 63     Resp 06/27/20 1145 18     Temp 06/27/20 1145 98.3 F (36.8 C)     Temp Source 06/27/20 1145 Oral     SpO2 06/27/20 1145 97 %     Weight --      Height --      Head Circumference --      Peak Flow --      Pain Score 06/27/20 1146 5     Pain Loc --      Pain Edu? --      Excl. in [place]? --    No data found.  Updated Vital Signs BP 121/71 (BP Location: Right Arm)   Pulse 63   Temp 98.3 F (36.8 C) (Oral)   Resp 18   SpO2 97%   Visual Acuity Right Eye Distance:   Left Eye Distance:   Bilateral Distance:    Right Eye Near:   Left Eye Near:    Bilateral Near:     Physical Exam Vitals and nursing note reviewed.  Constitutional:      General: She is not in acute distress.    Appearance: She is not ill-appearing.  Cardiovascular:     Heart sounds: Normal heart sounds.  Pulmonary:     Effort: Pulmonary effort is normal.     Breath sounds: Normal breath sounds. No decreased breath sounds, wheezing or rhonchi.  Chest:     Chest wall: Tenderness present.     Comments: Reproducible tenderness in the right costochondral region. Abdominal:     General: Bowel sounds are normal.  Musculoskeletal:        General: Normal range of motion.  Neurological:     Mental Status: She is alert.      UC Treatments / Results  Labs (all labs ordered are listed, but only abnormal results are displayed) Labs Reviewed - No data to display  EKG   Radiology No results found.  Procedures Procedures (including critical care time)  Medications Ordered in UC Medications - No data to display  Initial Impression / Assessment and Plan / UC Course  I have reviewed the triage vital signs and the nursing notes.  Pertinent labs & imaging results that were  available  during my care of the patient were reviewed by me and considered in my medical decision making (see chart for details).     1.  Costochondritis: EKG showed normal sinus rhythm with incomplete right bundle branch block.  Isolated T wave inversion in lead V1. Risk of cardiac cause of chest pain is low Ibuprofen every 6 hours as needed for chest pain Heating pad to the affected area as discussed Return to urgent care if symptoms worsen. Final Clinical Impressions(s) / UC Diagnoses   Final diagnoses:  Costochondritis     Discharge Instructions     Take medications as directed Warm compress will be helpful If you experience worsening symptoms please return to urgent care to be reevaluated   ED Prescriptions    Medication Sig Dispense Auth. Provider   ibuprofen (ADVIL) 400 MG tablet Take 1 tablet (400 mg total) by mouth every 6 (six) hours as needed. 30 tablet Emori Mumme, Myrene Galas, MD     PDMP not reviewed this encounter.   Chase Picket, MD 06/27/20 [phone]

## 2020-06-27 NOTE — Discharge Instructions (Signed)
Take medications as directed Warm compress will be helpful If you experience worsening symptoms please return to urgent care to be reevaluated

## 2020-06-27 NOTE — ED Triage Notes (Signed)
Pt presents with intermittent central chest pain since last night.

## 2020-06-28 NOTE — Progress Notes (Signed)
Office Visit Note  Patient: Alexis Hicks             Date of Birth: 08/10/1960           MRN: [id number]             PCP: Delsa Bern, MD Referring: Delsa Bern, MD Visit Date: 07/11/2020 Occupation: @GUAROCC @  Subjective:  Muscle tenderness   History of Present Illness: Alexis Hicks is a 60 y.o. female with history of osteoporosis, osteoarthritis, fibromyalgia, and DDD. She is on actonel 150 mg every 30 days prescribed by her gynecologist for management of osteoporosis.  She continues to take a calcium and vitamin D supplement on a daily basis.  She denies any recent falls or fractures. Patient reports that she was evaluated at urgent care on 06/27/2020 and was diagnosed with costochondritis.  She states that she has been working with a Physiological scientist as well as doing her own home exercises which may contributed to her symptoms.  Patient ports that she is going to physical therapy on a weekly basis and they are currently working on left trapezius muscle tension and tenderness.  She has been using a TENS unit as well as massage.  She continues to experience pain and stiffness in both hands. She denies any joint swelling. She wears CMC joint braces on a daily basis for support.  She is currently needing a blanket for her future grandchild.      Activities of Daily Living:  Patient reports morning stiffness for 5  minutes.   Patient Denies nocturnal pain.  Difficulty dressing/grooming: Denies Difficulty climbing stairs: Denies Difficulty getting out of chair: Denies Difficulty using hands for taps, buttons, cutlery, and/or writing: Denies  Review of Systems  Constitutional: Positive for fatigue.  HENT: Negative for mouth sores, mouth dryness and nose dryness.   Eyes: Positive for dryness. Negative for pain and itching.  Respiratory: Negative for shortness of breath and difficulty breathing.   Cardiovascular: Negative for chest pain and palpitations.   Gastrointestinal: Negative for blood in stool, constipation and diarrhea.  Endocrine: Negative for increased urination.  Genitourinary: Negative for difficulty urinating.  Musculoskeletal: Positive for arthralgias, joint pain and morning stiffness. Negative for joint swelling, myalgias, muscle tenderness and myalgias.  Skin: Positive for rash. Negative for color change and redness.  Allergic/Immunologic: Negative for susceptible to infections.  Neurological: Positive for numbness and parasthesias. Negative for dizziness, headaches, memory loss and weakness.  Hematological: Negative for bruising/bleeding tendency.  Psychiatric/Behavioral: Negative for confusion.    PMFS History:  Patient Active Problem List   Diagnosis Date Noted  . Osteopenia of multiple sites 08/25/2016  . Vitamin D deficiency 08/25/2016  . Fatigue 02/17/2016  . Primary osteoarthritis of both feet 02/17/2016  . 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 Osteoarthritis of lumbar spine 02/17/2016  . Weakness of right lower extremity 02/17/2016  . Migraine without status migrainosus, not intractable 02/17/2016  . Raynaud's  02/17/2016  . Sicca syndrome 02/17/2016  . DUB (dysfunctional uterine bleeding)   . Fibromyalgia syndrome 09/10/2011  . Primary osteoarthritis of both hands 09/10/2011  . Scoliosis 09/10/2011    Past Medical History:  Diagnosis Date  . Chronically dry eyes, right   . Dry mouth   . DUB (dysfunctional uterine bleeding)   . Fibromyalgia   . Osteoporosis 04/2018   Dr. Butler Denmark  . Raynaud's syndrome     Family History  Problem Relation Age of Onset  . Breast cancer Paternal Aunt   . Breast cancer Paternal  Grandmother   . Arthritis Mother   . Osteoporosis Mother   . Dementia Mother   . Skin cancer Father   . Heart disease Father   . Depression Maternal Grandmother   . Hypertension Maternal Grandmother   . Stroke Maternal Grandmother   . Kidney disease Maternal Grandfather   . Uterine cancer Maternal  Aunt   . Metabolic syndrome Brother   . Diabetes Brother   . Hypertension Brother   . High Cholesterol Brother   . Stroke Brother    Past Surgical History:  Procedure Laterality Date  . SALPINGECTOMY     Social History   Social History Narrative  . Not on file   Immunization History  Administered Date(s) Administered  . Influenza-Unspecified 01/11/2019  . PFIZER(Purple Top)SARS-COV-2 Vaccination 06/16/2019, 07/10/2019, 03/09/2020     Objective: Vital Signs: BP 105/70 (BP Location: Left Arm, Patient Position: Sitting, Cuff Size: Normal)   Pulse 61   Resp 13   Ht 5\' 5"  (1.651 m)   Wt 146 lb 3.2 oz (66.3 kg)   BMI 24.33 kg/m    Physical Exam Vitals and nursing note reviewed.  Constitutional:      Appearance: She is well-developed.  HENT:     Head: Normocephalic and atraumatic.  Eyes:     Conjunctiva/sclera: Conjunctivae normal.  Pulmonary:     Effort: Pulmonary effort is normal.  Abdominal:     Palpations: Abdomen is soft.  Musculoskeletal:     Cervical back: Normal range of motion.  Skin:    General: Skin is warm and dry.     Capillary Refill: Capillary refill takes less than 2 seconds.  Neurological:     Mental Status: She is alert and oriented to person, place, and time.  Psychiatric:        Behavior: Behavior normal.      Musculoskeletal Exam: C-spine has limited range of motion with lateral rotation especially to the left.  Thoracic and lumbar spine have good range of motion.  Trapezius muscle tension and muscle tenderness bilaterally, left greater than right.  Shoulder joints, elbow joints, wrist joints, MCPs, PIPs, DIPs have good range of motion with no synovitis.  Tenderness over bilateral CMC joints.  Complete fist formation bilaterally.  Hip joints have good range of motion with no discomfort.  No tenderness over trochanteric bursa bilaterally.  Knee joints have good range of motion with no warmth or effusion.  Right ankle was in a brace.  Left ankle has  good range of motion with no tenderness or inflammation.   CDAI Exam: CDAI Score: -- Patient Global: --; Provider Global: -- Swollen: --; Tender: -- Joint Exam 07/11/2020   No joint exam has been documented for this visit   There is currently no information documented on the homunculus. Go to the Rheumatology activity and complete the homunculus joint exam.  Investigation: No additional findings.  Imaging: No results found.  Recent Labs: No results found for: WBC, HGB, PLT, NA, K, CL, CO2, GLUCOSE, BUN, CREATININE, BILITOT, ALKPHOS, AST, ALT, PROT, ALBUMIN, CALCIUM, GFRAA, QFTBGOLD, QFTBGOLDPLUS  Speciality Comments: No specialty comments available.  Procedures:  No procedures performed Allergies: Patient has no known allergies.   Assessment / Plan:     Visit Diagnoses: Other osteoporosis without current pathological fracture - DEXA ordered by gynecologist.  She could not tolerate Boniva in the past.  She is taking Actonel 150 mg 1 tablet by mouth every 30 days for management of osteoporosis.  She continues to take a calcium  and vitamin D supplement as recommended.  She has not had any recent falls or fractures.  We discussed the importance of performing resistive exercises.  She can use resistive bands and light hand weights.   She will follow up in 6 months.   Vitamin D deficiency: She is taking vitamin D 2000 units daily.  DDD (degenerative disc disease), cervical: She has limited range of motion with lateral rotation especially to the left.  She is not experiencing any symptoms of radiculopathy at this time.  She has trapezius muscle tension and muscle tenderness bilaterally, left greater than right.  She is currently going to physical therapy and working on range of motion exercises.  She has been using a TENS unit, heat, and massage for symptomatic relief.  She was given salonpas patch samples today in the office to try.    Other form of scoliosis of thoracolumbar spine: No  midline spinal tenderness.  Spondylosis without myelopathy or radiculopathy, lumbar region: She is not experiencing any increased discomfort in her lower back at this time.  She has no symptoms of radiculopathy currently.  Discussed the importance of performing daily stretching exercises as well as starting yoga. She plans on restarting the classical stretch program.    Primary osteoarthritis of both hands: She has mild PIP and DIP prominence consistent with osteoarthritis of both hands.  Chronic CMC joint pain and tenderness palpation on exam.  She is able to make a complete fist bilaterally.  No synovitis was noted.  She wears CMC joint braces on a daily basis for support.  We discussed the importance of joint protection and muscle strengthening. She was given a list of natural anti-inflammatories to start taking. Discussed the importance of trying to follow a plant based diet.   Primary osteoarthritis of both feet: She experiences intermittent discomfort in both feet.  She wears proper fitting shoes.  She continues to wear a brace on her right ankle for support.  Sicca syndrome ([place]): She has ongoing sicca symptoms which have been tolerable overall.  She uses Restasis eyedrops on a daily basis for symptomatic relief.  Fibromyalgia syndrome: She has intermittent myalgias and muscle tenderness due to underlying fibromyalgia.  On 06/27/2020 she presented to urgent care with sharp right sided chest wall pain and was diagnosed with costochondritis.  Her symptoms have gradually started to improve.  She was advised to avoid push-ups and to keep her hand weights less than 5 pounds when performing strengthening exercises.  She is also experiencing left trapezius muscle tension and muscle tenderness.  She has been going to physical therapy and working on range of motion of the C-spine.  She has been using a TENS unit, massage, and heat which have been alleviating some of her symptoms.  We discussed the  importance of regular exercise and good sleep hygiene.  She plans on starting the classical stretching program as well as continuing to work with her personal trainer and physical therapist on a regular basis.  Other fatigue: Chronic but stable.  Discussed the importance of regular exercise.  History of migraine  Orders: No orders of the defined types were placed in this encounter.  No orders of the defined types were placed in this encounter.    Follow-Up Instructions: Return in about 6 months (around 01/10/2021) for Fibromyalgia, Osteoarthritis, Osteoporosis.   Ofilia Neas, PA-C  Note - This record has been created using Dragon software.  Chart creation errors have been sought, but may not always  have been  located. Such creation errors do not reflect on  the standard of medical care.

## 2020-07-11 ENCOUNTER — Other Ambulatory Visit: Payer: Self-pay

## 2020-07-11 ENCOUNTER — Encounter: Payer: Self-pay | Admitting: Physician Assistant

## 2020-07-11 ENCOUNTER — Ambulatory Visit: Payer: 59 | Admitting: Physician Assistant

## 2020-07-11 VITALS — BP 105/70 | HR 61 | Resp 13 | Ht 65.0 in | Wt 146.2 lb

## 2020-07-11 DIAGNOSIS — E559 Vitamin D deficiency, unspecified: Secondary | ICD-10-CM

## 2020-07-11 DIAGNOSIS — M4185 Other forms of scoliosis, thoracolumbar region: Secondary | ICD-10-CM

## 2020-07-11 DIAGNOSIS — M5030 Other cervical disc degeneration, unspecified cervical region: Secondary | ICD-10-CM | POA: Diagnosis not present

## 2020-07-11 DIAGNOSIS — M19041 Primary osteoarthritis, right hand: Secondary | ICD-10-CM

## 2020-07-11 DIAGNOSIS — M47816 Spondylosis without myelopathy or radiculopathy, lumbar region: Secondary | ICD-10-CM

## 2020-07-11 DIAGNOSIS — M19072 Primary osteoarthritis, left ankle and foot: Secondary | ICD-10-CM

## 2020-07-11 DIAGNOSIS — M818 Other osteoporosis without current pathological fracture: Secondary | ICD-10-CM | POA: Diagnosis not present

## 2020-07-11 DIAGNOSIS — M19071 Primary osteoarthritis, right ankle and foot: Secondary | ICD-10-CM

## 2020-07-11 DIAGNOSIS — R5383 Other fatigue: Secondary | ICD-10-CM

## 2020-07-11 DIAGNOSIS — Z8669 Personal history of other diseases of the nervous system and sense organs: Secondary | ICD-10-CM

## 2020-07-11 DIAGNOSIS — M3500 Sicca syndrome, unspecified: Secondary | ICD-10-CM

## 2020-07-11 DIAGNOSIS — M19042 Primary osteoarthritis, left hand: Secondary | ICD-10-CM

## 2020-07-11 DIAGNOSIS — M797 Fibromyalgia: Secondary | ICD-10-CM

## 2020-08-21 ENCOUNTER — Telehealth: Payer: Self-pay | Admitting: *Deleted

## 2020-08-21 NOTE — Telephone Encounter (Signed)
Received DEXA results from [facility]  Date of Scan: 08/21/2020 Lowest T-score and lowest site measured: T-score -2.7 left femoral neck Significant changes in BMD and site measured (5% and above): 13%  Current Regimen: Calcium and Vitamin D, Boniva previously, Actonel   Recommendation: appt to discuss treatment options  Reviewed by: Dr. Bo Merino  Next Appointment: 01/09/2021

## 2020-09-18 ENCOUNTER — Ambulatory Visit: Payer: 59 | Admitting: Family Medicine

## 2020-09-18 ENCOUNTER — Other Ambulatory Visit: Payer: Self-pay

## 2020-09-18 VITALS — BP 122/66 | HR 61 | Temp 98.2°F | Resp 17 | Ht 65.0 in | Wt 148.8 lb

## 2020-09-18 DIAGNOSIS — Z8669 Personal history of other diseases of the nervous system and sense organs: Secondary | ICD-10-CM

## 2020-09-18 DIAGNOSIS — M810 Age-related osteoporosis without current pathological fracture: Secondary | ICD-10-CM

## 2020-09-18 DIAGNOSIS — H539 Unspecified visual disturbance: Secondary | ICD-10-CM

## 2020-09-18 DIAGNOSIS — M5431 Sciatica, right side: Secondary | ICD-10-CM | POA: Diagnosis not present

## 2020-09-18 DIAGNOSIS — M3500 Sicca syndrome, unspecified: Secondary | ICD-10-CM

## 2020-09-18 DIAGNOSIS — J309 Allergic rhinitis, unspecified: Secondary | ICD-10-CM | POA: Diagnosis not present

## 2020-09-18 DIAGNOSIS — M797 Fibromyalgia: Secondary | ICD-10-CM | POA: Diagnosis not present

## 2020-09-18 DIAGNOSIS — R059 Cough, unspecified: Secondary | ICD-10-CM

## 2020-09-18 NOTE — Patient Instructions (Addendum)
Continue flonase for allergies. If increased cough, would consider flovent for possible cough type of asthma or other lung testing. Follow up if cough persists.   Symptoms on Monday may have have been ocular migraine - if returns, then I recommend evaluation, possibly with headache specialist.    If any loss or darkening of vision, or focal weakness or new neurologic symptoms be seen in ER.   I will request records from Dr. Estanislado Pandy, but follow up in next 1 month to discuss sciatica symptoms and plan as well. You can also discuss the question about updated MRI with Dr. Estanislado Pandy.   I will request some records form Dr. Cletis Media to decide on next labwork needed.   Thanks for coming in today.   Return to the clinic or go to the nearest emergency room if any of your symptoms worsen or new symptoms occur.

## 2020-09-18 NOTE — Progress Notes (Signed)
Subjective:  Patient ID: Alexis Hicks, female    DOB: 1960/07/22  Age: 60 y.o. MRN: [id number]  CC:  Chief Complaint  Patient presents with   Establish Care    Pt here to establish care, pt reports she will have records from [facility] and GYN sent over for last 1 year in the next few weeks GYN follows osteoporosis with DEXA scans     HPI Alexis Hicks presents for  Establish care.  Gyn has been her PCP to this point. Dr. Cletis Media.  Osteoporosis: Treated by GYN. Dr. Cletis Media.  On Actonel past 1 year. Tolerating ok. No new symptoms.  Calcium 600mg  qd and other calcium in diet and vit D 2,000iu per day.  Prior strict vegan 2015-2020. 90% vegan.   Derm: Dr.  Renda Rolls.  Hx of eczema. Followed once per year. Tac 0.1% cream to flairs if needed. Uses Aveeno, Nivea and other lotions as needed. Coconut oil.   Rheumatology: Multiple autoimmune conditions  - Sicca syndrome, raynaud's syndrome, fibromyalgia.  Dr. Estanislado Pandy - appt every months.  Followed by optho - Dr. Gwynn Burly,  Restasis for dry eye.   Allergic rhinitis Treated with flonase daily, usually treats well. Cough at times. No hx of asthma known. Unable to take antihistamines. Wheeze sounding cough at times most days. No GERD/ heartburn.   Migraine headaches. More noted in her 40's-50's. Better in past 10 years - none in 5-7 years.  2 days ago - had visual disturbance - both eyes, zig zag appearance - for 10 minutes.  No darkening or loss of vision. Felt like prior Aura and ocular migraine. no headache - took few advil. Feels normal now. Went to PT on Monday - worked on head as well - has not returned.   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 Nerves entangled? No surgery. Multiple MRI's in past. No apparent recommendation for surgery.  Seen by ortho in past - 2008 at [facility] orthopaedics. PT prescribed by Dr. Estanislado Pandy. Continued pressure on leg, trouble with walking, has  foot drop- wearing brace from PT - [place]. Over time has become weak. No acute changes. PT balance training every 3 weeks and for spinal stenosis in neck.   Deferred shingles vaccine today.    History Patient Active Problem List   Diagnosis Date Noted   Osteopenia of multiple sites 08/25/2016   Vitamin D deficiency 08/25/2016   Fatigue 02/17/2016   Primary osteoarthritis of both feet 02/17/2016   DDD Cspine 02/17/2016   Osteoarthritis of lumbar spine 02/17/2016   Weakness of right lower extremity 02/17/2016   Migraine without status migrainosus, not intractable 02/17/2016   Raynaud's  02/17/2016   Sicca syndrome ([place]) 02/17/2016   DUB (dysfunctional uterine bleeding)    Fibromyalgia syndrome 09/10/2011   Primary osteoarthritis of both hands 09/10/2011   Scoliosis 09/10/2011   Past Medical History:  Diagnosis Date   Chronically dry eyes, right    Dry mouth    DUB (dysfunctional uterine bleeding)    Fibromyalgia    Osteoporosis 04/2018   Dr. Butler Denmark   Raynaud's syndrome    Past Surgical History:  Procedure Laterality Date   SALPINGECTOMY     No Known Allergies Prior to Admission medications   Medication Sig Start Date End Date Taking? Authorizing Provider  ascorbic Acid (VITAMIN C) 500 MG CPCR as needed.   Yes [provider]  Biotin 1 MG CAPS Take by mouth.   Yes [provider]  Calcium Carbonate-Vitamin  D (CALCIUM 600+D HIGH POTENCY PO) calcium   Yes [provider]  cholecalciferol (VITAMIN D) 1000 units tablet Take 2,000 Units by mouth daily.    Yes [provider]  Cyanocobalamin (VITAMIN B 12 PO) Take by mouth.   Yes [provider]  cycloSPORINE (RESTASIS) 0.05 % ophthalmic emulsion 1 drop daily.   Yes [provider]  fluticasone (FLONASE) 50 MCG/ACT nasal spray Place into both nostrils daily.   Yes [provider]  risedronate (ACTONEL) 150 MG tablet Take 150 mg by mouth every 30 (thirty) days.  01/11/20  Yes [provider]  triamcinolone cream (KENALOG) 0.1 % triamcinolone acetonide 0.1 % topical cream  APPLY TO SKIN TWICE DAILY X 14 DAYS   Yes [provider]  Zinc 10 MG LOZG Use as directed in the mouth or throat.   Yes [provider]   Social History   Socioeconomic History   Marital status: Married    Spouse name: Not on file   Number of children: Not on file   Years of education: Not on file   Highest education level: Not on file  Occupational History   Not on file  Tobacco Use   Smoking status: Never   Smokeless tobacco: Never  Vaping Use   Vaping Use: Never used  Substance and Sexual Activity   Alcohol use: Yes    Comment: 1-2 glasses of wine/week or less   Drug use: No   Sexual activity: Yes    Birth control/protection: Pill    Comment: Micronor  Other Topics Concern   Not on file  Social History Narrative   Not on file   Social Determinants of Health   Financial Resource Strain: Not on file  Food Insecurity: Not on file  Transportation Needs: Not on file  Physical Activity: Not on file  Stress: Not on file  Social Connections: Not on file  Intimate Partner Violence: Not on file    Review of Systems  Per HPI Objective:   Vitals:   09/18/20 1504  BP: 122/66  Pulse: 61  Resp: 17  Temp: 98.2 F (36.8 C)  TempSrc: Temporal  SpO2: 96%  Weight: 148 lb 12.8 oz (67.5 kg)  Height: 5\' 5"  (1.651 m)     Physical Exam Vitals reviewed.  Constitutional:      Appearance: Normal appearance. She is well-developed.  HENT:     Head: Normocephalic and atraumatic.  Eyes:     Conjunctiva/sclera: Conjunctivae normal.     Pupils: Pupils are equal, round, and reactive to light.  Neck:     Vascular: No carotid bruit.  Cardiovascular:     Rate and Rhythm: Normal rate and regular rhythm.     Heart sounds: Normal heart sounds.  Pulmonary:     Effort: Pulmonary effort is normal.     Breath sounds: Normal breath sounds.   Abdominal:     Palpations: Abdomen is soft. There is no pulsatile mass.     Tenderness: There is no abdominal tenderness.  Musculoskeletal:     Right lower leg: No edema.     Left lower leg: No edema.  Skin:    General: Skin is warm and dry.  Neurological:     Mental Status: She is alert and oriented to person, place, and time.  Psychiatric:        Mood and Affect: Mood normal.        Behavior: Behavior normal.   Neuro, no focal weakness, no pronator drift.  Nonfocal neurologic exam.  45 minutes spent during visit, including chart review, counseling and assimilation of information, discussing cough, prior sciatic and treatments, exam, discussion of plan, and chart completion.   Records requested.    Assessment & Plan:  Alexis Hicks is a 60 y.o. female . Fibromyalgia Sicca syndrome (HCC) Right sided sciatica  -Rheumatology.  We will try to obtain more information/records regarding the sciatica and possible cystic structures as noted above to determine next step in treatment.  Plan on follow-up in next 1 month  Allergic rhinitis, unspecified seasonality, unspecified trigger Cough  -Likely postnasal drip/upper airway cough due to allergies.  Continue Flonase.  However with her described symptoms differential includes cough variant asthma, option to try Flovent short-term.  RTC precautions.  Osteoporosis without current pathological fracture, unspecified osteoporosis type  -On treatment as above, will obtain records regarding last DEXA and timing of repeat testing.  Tolerating current regimen.  History of migraine Visual disturbance   -Possible ocular migraine symptoms but improved at this time.  If recurrent symptoms likely will benefit from repeat visit with headache specialist.  Can also discuss symptoms with her optometrist/ophthalmologist but again the symptoms have improved.  RTC/ER precautions.  No orders of the defined types were placed in this  encounter.  Patient Instructions  Continue flonase for allergies. If increased cough, would consider flovent for possible cough type of asthma or other lung testing. Follow up if cough persists.   Symptoms on Monday may have have been ocular migraine - if returns, then I recommend evaluation, possibly with headache specialist.    If any loss or darkening of vision, or focal weakness or new neurologic symptoms be seen in ER.   I will request records from Dr. Estanislado Pandy, but follow up in next 1 month to discuss sciatica symptoms and plan as well. You can also discuss the question about updated MRI with Dr. Estanislado Pandy.   I will request some records form Dr. Cletis Media to decide on next labwork needed.   Thanks for coming in today.   Return to the clinic or go to the nearest emergency room if any of your symptoms worsen or new symptoms occur.           Signed,   Merri Ray, MD [place], [facility] 09/18/20 4:42 PM

## 2020-10-24 ENCOUNTER — Other Ambulatory Visit: Payer: Self-pay

## 2020-10-24 ENCOUNTER — Ambulatory Visit: Payer: 59 | Admitting: Family Medicine

## 2020-10-24 ENCOUNTER — Encounter: Payer: Self-pay | Admitting: Family Medicine

## 2020-10-24 VITALS — BP 126/72 | HR 62 | Temp 98.2°F | Resp 15 | Ht 65.0 in | Wt 149.2 lb

## 2020-10-24 DIAGNOSIS — M5431 Sciatica, right side: Secondary | ICD-10-CM

## 2020-10-24 DIAGNOSIS — M797 Fibromyalgia: Secondary | ICD-10-CM | POA: Diagnosis not present

## 2020-10-24 DIAGNOSIS — Z23 Encounter for immunization: Secondary | ICD-10-CM | POA: Diagnosis not present

## 2020-10-24 DIAGNOSIS — J309 Allergic rhinitis, unspecified: Secondary | ICD-10-CM | POA: Diagnosis not present

## 2020-10-24 DIAGNOSIS — R059 Cough, unspecified: Secondary | ICD-10-CM | POA: Diagnosis not present

## 2020-10-24 NOTE — Patient Instructions (Addendum)
Discuss hip pain and leg pain/numbness with Dr. Estanislado Pandy to decided if any further imaging or evaluation with a surgeon if needed. I am happy to place a referral if needed, but would want to see some prior notes and imaging for more details. I am happy to talk to Dr. Estanislado Pandy if needed.   Current cough should continue to improve from recent viral infection.  If persistent cough, could consider inhaled steroid for possible asthma. Let me know how you are doing in next 4-6 weeks.  Return to the clinic or go to the nearest emergency room if any of your symptoms worsen or new symptoms occur.  2nd shingles vaccine in 2-6 months.

## 2020-10-24 NOTE — Progress Notes (Signed)
Subjective:  Patient ID: Alexis Hicks, female    DOB: [birth year]  Age: 60 y.o. MRN: [id number]  CC:  Chief Complaint  Patient presents with   Fibromyalgia    Pt here for 1 month recheck fibromyalgia no changes doing okay today.    Immunizations    Pt willing to start shingrix vaccine series today if advised     HPI Alexis Hicks presents for   Fibromyalgia, sicca syndrome, right-sided sciatica. Discussed at her visit June 15.  Had reported some cystic structures at last visit.  Followed by Dr. Estanislado Pandy, previously [facility] orthopedics.  Continue pressure on her leg when discussed last visit that did affect walking some.  Had been undergoing physical therapy. On brace for foot drop. Prior MRI's with ortho at [facility].  No current pain meds. Physical therapy is going well - pain resolved. Improved motion. PT every 3 weeks. She will d/w Dr. Estanislado Pandy at next visit in October.   Cough Discussed last month, possible postnasal drip, upper airway cough due to allergies, continue on Flonase.  Option of Flovent short-term for possible cough variant asthma.  Currently taking Flonase. Fatigue and sore throat, PND without fever with illness few weeks ago. Did not perform covid testing. Son had similar symptoms, he did 1 covid test that was negative, but not repeated. Coworkers had covid recently 2 weeks prior.  Some cough with prolonged talking, but otherwise better.  Using flonase. Feels better except slight cough.   History Patient Active Problem List   Diagnosis Date Noted   Osteopenia of multiple sites 08/25/2016   Vitamin D deficiency 08/25/2016   Fatigue 02/17/2016   Primary osteoarthritis of both feet 02/17/2016   DDD Cspine 02/17/2016   Osteoarthritis of lumbar spine 02/17/2016   Weakness of right lower extremity 02/17/2016   Migraine without status migrainosus, not intractable 02/17/2016   Raynaud's  02/17/2016   Sicca syndrome ([place]) 02/17/2016    DUB (dysfunctional uterine bleeding)    Fibromyalgia syndrome 09/10/2011   Primary osteoarthritis of both hands 09/10/2011   Scoliosis 09/10/2011   Past Medical History:  Diagnosis Date   Chronically dry eyes, right    Dry mouth    DUB (dysfunctional uterine bleeding)    Fibromyalgia    Osteoporosis 04/2018   Dr. Butler Denmark   Raynaud's syndrome    Past Surgical History:  Procedure Laterality Date   SALPINGECTOMY     No Known Allergies Prior to Admission medications   Medication Sig Start Date End Date Taking? Authorizing Provider  ascorbic Acid (VITAMIN C) 500 MG CPCR as needed.   Yes [provider]  Biotin 1 MG CAPS Take by mouth.   Yes [provider]  Calcium Carbonate-Vitamin D (CALCIUM 600+D HIGH POTENCY PO) calcium   Yes [provider]  cholecalciferol (VITAMIN D) 1000 units tablet Take 2,000 Units by mouth daily.    Yes [provider]  Cyanocobalamin (VITAMIN B 12 PO) Take by mouth.   Yes [provider]  cycloSPORINE (RESTASIS) 0.05 % ophthalmic emulsion 1 drop daily.   Yes [provider]  fluticasone (FLONASE) 50 MCG/ACT nasal spray Place into both nostrils daily.   Yes [provider]  risedronate (ACTONEL) 150 MG tablet Take 150 mg by mouth every 30 (thirty) days. 01/11/20  Yes [provider]  triamcinolone cream (KENALOG) 0.1 % triamcinolone acetonide 0.1 % topical cream  APPLY TO SKIN TWICE DAILY X 14 DAYS   Yes [provider]  Zinc  10 MG LOZG Use as directed in the mouth or throat.   Yes [provider]   Social History   Socioeconomic History   Marital status: Married    Spouse name: Not on file   Number of children: Not on file   Years of education: Not on file   Highest education level: Not on file  Occupational History   Not on file  Tobacco Use   Smoking status: Never   Smokeless tobacco: Never  Vaping Use   Vaping Use: Never used  Substance and Sexual  Activity   Alcohol use: Yes    Comment: 1-2 glasses of wine/week or less   Drug use: No   Sexual activity: Yes    Birth control/protection: Pill    Comment: Micronor  Other Topics Concern   Not on file  Social History Narrative   Not on file   Social Determinants of Health   Financial Resource Strain: Not on file  Food Insecurity: Not on file  Transportation Needs: Not on file  Physical Activity: Not on file  Stress: Not on file  Social Connections: Not on file  Intimate Partner Violence: Not on file    Review of Systems Per above HPI.   Objective:   Vitals:   10/24/20 1313  BP: 126/72  Pulse: 62  Resp: 15  Temp: 98.2 F (36.8 C)  TempSrc: Temporal  SpO2: 95%  Weight: 149 lb 3.2 oz (67.7 kg)  Height: '5\' 5"'$  (1.651 m)     Physical Exam Vitals reviewed.  Constitutional:      Appearance: Normal appearance. She is well-developed.  HENT:     Head: Normocephalic and atraumatic.  Eyes:     Conjunctiva/sclera: Conjunctivae normal.     Pupils: Pupils are equal, round, and reactive to light.  Neck:     Vascular: No carotid bruit.  Cardiovascular:     Rate and Rhythm: Normal rate and regular rhythm.     Heart sounds: Normal heart sounds.  Pulmonary:     Effort: Pulmonary effort is normal. No respiratory distress.     Breath sounds: Normal breath sounds. No wheezing, rhonchi or rales.  Abdominal:     Palpations: Abdomen is soft. There is no pulsatile mass.     Tenderness: There is no abdominal tenderness.  Musculoskeletal:     Right lower leg: No edema.     Left lower leg: No edema.  Skin:    General: Skin is warm and dry.  Neurological:     Mental Status: She is alert and oriented to person, place, and time.  Psychiatric:        Mood and Affect: Mood normal.        Behavior: Behavior normal.       Assessment & Plan:  [name] is a 60 y.o. female . Fibromyalgia Right sided sciatica  -Longstanding symptoms, currently followed by  rheumatology and undergoing physical therapy with improvement.  Asked that she discuss further work-up, imaging, or referral to other specialist with her rheumatologist initially as I unfortunately do not have access to previous imaging or work-up.  I am happy to obtain those as well if needed and place referral if needed.  Cough Allergic rhinitis, unspecified seasonality, unspecified trigger  -Likely allergic rhinitis with recent viral illness, possible COVID infection but now improving.  Continue Flonase.  Update in the next 4 to 6 weeks.  If cough not improving, trial of Flovent for possible cough variant asthma.  Need for shingles  vaccine - Plan: Varicella-zoster vaccine IM Given, repeat in 2 to 6 months.  Potential side effects and short-term symptoms discussed  No orders of the defined types were placed in this encounter.  Patient Instructions  Discuss hip pain and leg pain/numbness with Dr. Estanislado Pandy to decided if any further imaging or evaluation with a surgeon if needed. I am happy to place a referral if needed, but would want to see some prior notes and imaging for more details. I am happy to talk to Dr. Estanislado Pandy if needed.   Current cough should continue to improve from recent viral infection.  If persistent cough, could consider inhaled steroid for possible asthma. Let me know how you are doing in next 4-6 weeks.  Return to the clinic or go to the nearest emergency room if any of your symptoms worsen or new symptoms occur.  2nd shingles vaccine in 2-6 months.      Signed,   Merri Ray, MD [place], [facility] 10/24/20 2:23 PM

## 2020-12-27 NOTE — Progress Notes (Addendum)
Office Visit Note  Patient: Alexis Hicks             Date of Birth: [birth year]           MRN: [id number]             PCP: Wendie Agreste, MD Referring: Delsa Bern, MD Visit Date: 01/09/2021 Occupation: @GUAROCC @  Subjective:  Discussed DEXA scan, pain in neck.   History of Present Illness: Alexis Hicks is a 60 y.o. female with a history of osteoporosis, osteoarthritis, FMS  and degenerative disc disease.  She had recent DEXA scan in June 2022 which she wanted to discuss today.  She states she has been experiencing some migraine headaches and neck pain.  She is going to physical therapy and getting some exercises for her C-spine.  She continues to have some discomfort in her lower back which is manageable.  Her sicca symptoms are manageable with over-the-counter products and increased fluid intake.  Her fibromyalgia symptoms are manageable.  She does have hyperalgesia  Activities of Daily Living:  Patient reports morning stiffness for 1 minute.   Patient Denies nocturnal pain.  Difficulty dressing/grooming: Denies Difficulty climbing stairs: Denies Difficulty getting out of chair: Denies Difficulty using hands for taps, buttons, cutlery, and/or writing: Reports  Review of Systems  Constitutional:  Positive for fatigue.  HENT:  Positive for mouth dryness.   Eyes:  Positive for dryness.  Respiratory:  Negative for shortness of breath.   Cardiovascular:  Negative for swelling in legs/feet.  Gastrointestinal:  Negative for constipation.  Endocrine: Positive for heat intolerance.  Genitourinary:  Negative for difficulty urinating.  Musculoskeletal:  Positive for joint pain, gait problem, joint pain, muscle weakness, morning stiffness and muscle tenderness.  Skin:  Negative for color change and sensitivity to sunlight.  Allergic/Immunologic: Positive for susceptible to infections.  Neurological:  Positive for numbness and weakness.  Hematological:  Negative for  bruising/bleeding tendency.  Psychiatric/Behavioral:  Negative for sleep disturbance.    PMFS History:  Patient Active Problem List   Diagnosis Date Noted   Osteopenia of multiple sites 08/25/2016   Vitamin D deficiency 08/25/2016   Fatigue 02/17/2016   Primary osteoarthritis of both feet 02/17/2016   DDD Cspine 02/17/2016   Osteoarthritis of lumbar spine 02/17/2016   Weakness of right lower extremity 02/17/2016   Migraine without status migrainosus, not intractable 02/17/2016   Raynaud's  02/17/2016   Sicca syndrome ([place]) 02/17/2016   DUB (dysfunctional uterine bleeding)    Fibromyalgia syndrome 09/10/2011   Primary osteoarthritis of both hands 09/10/2011   Scoliosis 09/10/2011    Past Medical History:  Diagnosis Date   Chronically dry eyes, right    Dry mouth    DUB (dysfunctional uterine bleeding)    Fibromyalgia    Osteoporosis 04/2018   Dr. Butler [place]   Raynaud's syndrome     Family History  Problem Relation Age of Onset   Arthritis Mother    Osteoporosis Mother    Dementia Mother    Skin cancer Father    Heart disease Father    Metabolic syndrome Brother    Diabetes Brother    Hypertension Brother    High Cholesterol Brother    Stroke Brother    Uterine cancer Maternal Aunt    Breast cancer Paternal Aunt    Depression Maternal Grandmother    Hypertension Maternal Grandmother    Stroke Maternal Grandmother    Kidney disease Maternal Grandfather    Breast cancer Paternal Grandmother  Past Surgical History:  Procedure Laterality Date   SALPINGECTOMY     Social History   Social History Narrative   Not on file   Immunization History  Administered Date(s) Administered   Influenza-Unspecified 01/11/2019   PFIZER(Purple Top)SARS-COV-2 Vaccination 06/16/2019, 07/10/2019, 03/09/2020, 08/14/2020   Zoster Recombinat (Shingrix) 10/24/2020, 01/02/2021     Objective: Vital Signs: BP 113/73 (BP Location: Left Arm, Patient Position: Sitting, Cuff Size: Normal)    Pulse (!) 59   Resp 15   Ht 5\' 5"  (1.651 m)   Wt 148 lb 3.2 oz (67.2 kg)   BMI 24.66 kg/m    Physical Exam Vitals and nursing note reviewed.  Constitutional:      Appearance: She is well-developed.  HENT:     Head: Normocephalic and atraumatic.  Eyes:     Conjunctiva/sclera: Conjunctivae normal.  Cardiovascular:     Rate and Rhythm: Normal rate and regular rhythm.     Heart sounds: Normal heart sounds.  Pulmonary:     Effort: Pulmonary effort is normal.     Breath sounds: Normal breath sounds.  Abdominal:     General: Bowel sounds are normal.     Palpations: Abdomen is soft.  Musculoskeletal:     Cervical back: Normal range of motion.  Lymphadenopathy:     Cervical: No cervical adenopathy.  Skin:    General: Skin is warm and dry.     Capillary Refill: Capillary refill takes less than 2 seconds.  Neurological:     Mental Status: She is alert and oriented to person, place, and time.  Psychiatric:        Behavior: Behavior normal.     Musculoskeletal Exam: She had some limitation with lateral rotation of the cervical spine.  Mild thoracic kyphosis was noted.  She had good range of motion of her lumbar spine.  Shoulder joints, elbow joints, wrist joints with good range of motion.  She had bilateral PIP and DIP thickening with no synovitis.  Hip joints and knee joints were in good range of motion.  She had no tenderness over ankles or MTPs.  CDAI Exam: CDAI Score: -- Patient Global: --; Provider Global: -- Swollen: --; Tender: -- Joint Exam 01/09/2021   No joint exam has been documented for this visit   There is currently no information documented on the homunculus. Go to the Rheumatology activity and complete the homunculus joint exam.  Investigation: No additional findings.  Imaging: No results found.  Recent Labs: No results found for: WBC, HGB, PLT, NA, K, CL, CO2, GLUCOSE, BUN, CREATININE, BILITOT, ALKPHOS, AST, ALT, PROT, ALBUMIN, CALCIUM, GFRAA, QFTBGOLD,  QFTBGOLDPLUS  Speciality Comments: Bonivax 1 dose 03/21-SEs actonel 04/21   DEXA from [facility] September 21 2020 left femoral neck T score -2.7, BMD 0.551  DEX[facility] from [facility] June 07, 2018 left femoral neck T score -3.0, BMD 0.515  Procedures:  No procedures performed Allergies: Patient has no known allergies.   Assessment / Plan:     Visit Diagnoses: Other osteoporosis without current pathological fracture - DEXA ordered by gynecologist.  She could not tolerate Boniva in the past.  She took Boniva for only 1 month.  She started Actonel in April 2021.  She is taking Actonel 150 mg 1 tablet by mouth every 30 days.  She had recent bone density which showed T score of -2.7 and left femoral neck.  DEXA scan findings were discussed with the patient.  I do not have the DEXA scan from  2020 for comparison.  We will request that report.  It is too soon to assess the response from Actonel.  Use of calcium, vitamin D and resistive exercises were emphasized.  I received DEXA scan from 2020 after patient left.  The T score has improved but there was no percentage change reported.  Vitamin D deficiency-she is on vitamin D supplement.  DDD (degenerative disc disease), cervical-she has been experiencing pain and stiffness in her cervical spine.  She was also having headaches due to neck pain.  She has been going to physical therapy.  Some of the neck exercises were demonstrated in the office today.  Other form of scoliosis of thoracolumbar spine  DDD (degenerative disc disease), lumbar-core strengthening exercises and lumbar spine exercises were discussed.  Right foot drop - Related to DDD lumbar spine-she uses AFO brace.  Primary osteoarthritis of both hands-she has bilateral PIP and DIP thickening.  Joint protection muscle strengthening was discussed.  Primary osteoarthritis of both feet-she has been using proper fitting shoes.  Sicca syndrome (HCC)-she has been using  over-the-counter products which has been helpful.  She also drinks a lot of water.  Fibromyalgia syndrome-she is not experiencing a fibromyalgia flare but she continues to have hyperalgesia.  Stretching exercise and regular exercise was emphasized.  Other fatigue-related to fibromyalgia.  History of migraine  Orders: No orders of the defined types were placed in this encounter.  No orders of the defined types were placed in this encounter.    Follow-Up Instructions: Return in about 6 months (around 07/10/2021) for Osteoarthritis, Osteoporosis.   Bo Merino, MD  Note - This record has been created using Editor, commissioning.  Chart creation errors have been sought, but may not always  have been located. Such creation errors do not reflect on  the standard of medical care.

## 2021-01-02 ENCOUNTER — Ambulatory Visit: Payer: 59 | Admitting: Family Medicine

## 2021-01-02 ENCOUNTER — Encounter: Payer: Self-pay | Admitting: Family Medicine

## 2021-01-02 ENCOUNTER — Other Ambulatory Visit: Payer: Self-pay

## 2021-01-02 VITALS — BP 98/60 | HR 86 | Temp 98.1°F | Ht 65.0 in | Wt 147.0 lb

## 2021-01-02 DIAGNOSIS — L989 Disorder of the skin and subcutaneous tissue, unspecified: Secondary | ICD-10-CM

## 2021-01-02 DIAGNOSIS — Z23 Encounter for immunization: Secondary | ICD-10-CM | POA: Diagnosis not present

## 2021-01-02 DIAGNOSIS — R7989 Other specified abnormal findings of blood chemistry: Secondary | ICD-10-CM

## 2021-01-02 NOTE — Progress Notes (Signed)
Subjective:  Patient ID: Alexis Hicks, female    DOB: 1960/08/08  Age: 60 y.o. MRN: [id number]  CC:  Chief Complaint  Patient presents with   Hair/Scalp Problem    Pt stated--scalp area raise-up for about 6 months.    HPI Alexis Hicks presents for   Scalp lesion/elevation: Noticed elevated area on upper scalp past 6 months, slightly smaller recently. No pain, itching. No drainage. Smaller past few months.   Elevated LFT's Initially elevated prior to colonoscopy earlier this year. Recently repeated a week ago. Told was normal now. Dr. Collene Mares.   2nd shingles vaccine today. Plans on flu vaccine and covid booster at work.    History Patient Active Problem List   Diagnosis Date Noted   Osteopenia of multiple sites 08/25/2016   Vitamin D deficiency 08/25/2016   Fatigue 02/17/2016   Primary osteoarthritis of both feet 02/17/2016   DDD Cspine 02/17/2016   Osteoarthritis of lumbar spine 02/17/2016   Weakness of right lower extremity 02/17/2016   Migraine without status migrainosus, not intractable 02/17/2016   Raynaud's  02/17/2016   Sicca syndrome ([place]) 02/17/2016   DUB (dysfunctional uterine bleeding)    Fibromyalgia syndrome 09/10/2011   Primary osteoarthritis of both hands 09/10/2011   Scoliosis 09/10/2011   Past Medical History:  Diagnosis Date   Chronically dry eyes, right    Dry mouth    DUB (dysfunctional uterine bleeding)    Fibromyalgia    Osteoporosis 04/2018   Dr. Butler [place]   Raynaud's syndrome    Past Surgical History:  Procedure Laterality Date   SALPINGECTOMY     No Known Allergies Prior to Admission medications   Medication Sig Start Date End Date Taking? Authorizing Provider  ascorbic Acid (VITAMIN C) 500 MG CPCR as needed.   Yes [provider]  Biotin 1 MG CAPS Take by mouth.   Yes [provider]  Calcium Carbonate-Vitamin D (CALCIUM 600+D HIGH POTENCY PO) calcium   Yes [provider]  cholecalciferol  (VITAMIN D) 1000 units tablet Take 2,000 Units by mouth daily.    Yes [provider]  Cyanocobalamin (VITAMIN B 12 PO) Take by mouth.   Yes [provider]  cycloSPORINE (RESTASIS) 0.05 % ophthalmic emulsion 1 drop daily.   Yes [provider]  fluticasone (FLONASE) 50 MCG/ACT nasal spray Place into both nostrils daily.   Yes [provider]  risedronate (ACTONEL) 150 MG tablet Take 150 mg by mouth every 30 (thirty) days. 01/11/20  Yes [provider]  triamcinolone cream (KENALOG) 0.1 % triamcinolone acetonide 0.1 % topical cream  APPLY TO SKIN TWICE DAILY X 14 DAYS   Yes [provider]  Zinc 10 MG LOZG Use as directed in the mouth or throat.   Yes [provider]   Social History   Socioeconomic History   Marital status: Married    Spouse name: Not on file   Number of children: Not on file   Years of education: Not on file   Highest education level: Not on file  Occupational History   Not on file  Tobacco Use   Smoking status: Never   Smokeless tobacco: Never  Vaping Use   Vaping Use: Never used  Substance and Sexual Activity   Alcohol use: Yes    Comment: 1-2 glasses of wine/week or less   Drug use: No   Sexual activity: Yes    Birth control/protection: Pill    Comment: Micronor  Other Topics Concern   Not  on file  Social History Narrative   Not on file   Social Determinants of Health   Financial Resource Strain: Not on file  Food Insecurity: Not on file  Transportation Needs: Not on file  Physical Activity: Not on file  Stress: Not on file  Social Connections: Not on file  Intimate Partner Violence: Not on file    Review of Systems Per HPI.   Objective:   Vitals:   01/02/21 1455  BP: 98/60  Pulse: 86  Temp: 98.1 F (36.7 C)  SpO2: 97%  Weight: 147 lb (66.7 kg)  Height: 5\' 5"  (1.651 m)     Physical Exam Vitals reviewed.  Constitutional:      General: She is not in acute distress.     Appearance: Normal appearance. She is well-developed.  HENT:     Head: Normocephalic and atraumatic.  Cardiovascular:     Rate and Rhythm: Normal rate.  Pulmonary:     Effort: Pulmonary effort is normal.  Abdominal:     General: There is no distension.     Tenderness: There is no abdominal tenderness.  Skin:    Comments: 56mm flesh colored slight elevated lesion on dorsal scalp. No d/c, surrounding erythema or d/c.   Neurological:     Mental Status: She is alert and oriented to person, place, and time.  Psychiatric:        Mood and Affect: Mood normal.       Assessment & Plan:  Alexis Hicks is a 60 y.o. female . Scalp lesion  -Possible small seborrheic keratosis.  Reports decreased size.  Continue to monitor and if not resolving recommended dermatology follow-up.  She has seen dermatology, should not need referral but okay to place if needed.  Need for shingles vaccine - Plan: Varicella-zoster vaccine IM (Shingrix)  -Second dose given today with potential side effects/symptoms and management.  Elevated LFTs  -By report, then normalized.  Outside labs requested.  No orders of the defined types were placed in this encounter.  Patient Instructions  There is a small flesh colored nevus/mole on scalp. It does not appear concerning, but can have dermatology look at that area if still there in next 4-6 weeks as it is decreasing in size. Let me know if they need a referral.   I will request recent labs from Dr. Collene Mares. If liver tests were better, then no new workup needed at this time.   Follow up as planned in April, let me know if there are questions sooner.   Zoster Vaccine, Recombinant injection What is this medication? ZOSTER VACCINE (ZOS ter vak SEEN) is a vaccine used to reduce the risk of getting shingles. This vaccine is not used to treat shingles or nerve pain from shingles. This medicine may be used for other purposes; ask your health care provider or pharmacist  if you have questions. COMMON BRAND NAME(S): [facility] What should I tell my care team before I take this medication? They need to know if you have any of these conditions: cancer immune system problems an unusual or allergic reaction to Zoster vaccine, other medications, foods, dyes, or preservatives pregnant or trying to get pregnant breast-feeding How should I use this medication? This vaccine is injected into a muscle. It is given by a health care provider. A copy of Vaccine Information Statements will be given before each vaccination. Be sure to read this information carefully each time. This sheet may change often. Talk to your health care provider about the use  of this vaccine in children. This vaccine is not approved for use in children. Overdosage: If you think you have taken too much of this medicine contact a poison control center or emergency room at once. NOTE: This medicine is only for you. Do not share this medicine with others. What if I miss a dose? Keep appointments for follow-up (booster) doses. It is important not to miss your dose. Call your health care provider if you are unable to keep an appointment. What may interact with this medication? medicines that suppress your immune system medicines to treat cancer steroid medicines like prednisone or cortisone This list may not describe all possible interactions. Give your health care provider a list of all the medicines, herbs, non-prescription drugs, or dietary supplements you use. Also tell them if you smoke, drink alcohol, or use illegal drugs. Some items may interact with your medicine. What should I watch for while using this medication? Visit your health care provider regularly. This vaccine, like all vaccines, may not fully protect everyone. What side effects may I notice from receiving this medication? Side effects that you should report to your doctor or health care professional as soon as possible: allergic  reactions (skin rash, itching or hives; swelling of the face, lips, or tongue) trouble breathing Side effects that usually do not require medical attention (report these to your doctor or health care professional if they continue or are bothersome): chills headache fever nausea pain, redness, or irritation at site where injected tiredness vomiting This list may not describe all possible side effects. Call your doctor for medical advice about side effects. You may report side effects to FDA at 1-800-FDA-1088. Where should I keep my medication? This vaccine is only given by a health care provider. It will not be stored at home. NOTE: This sheet is a summary. It may not cover all possible information. If you have questions about this medicine, talk to your doctor, pharmacist, or health care provider.  2022 Elsevier/Gold Standard (2019-04-28 16:23:07)    Signed,   Merri Ray, MD [place], [facility] 01/02/21 5:48 PM

## 2021-01-02 NOTE — Patient Instructions (Addendum)
There is a small flesh colored nevus/mole on scalp. It does not appear concerning, but can have dermatology look at that area if still there in next 4-6 weeks as it is decreasing in size. Let me know if they need a referral.   I will request recent labs from Dr. Collene Mares. If liver tests were better, then no new workup needed at this time.   Follow up as planned in April, let me know if there are questions sooner.   Zoster Vaccine, Recombinant injection What is this medication? ZOSTER VACCINE (ZOS ter vak SEEN) is a vaccine used to reduce the risk of getting shingles. This vaccine is not used to treat shingles or nerve pain from shingles. This medicine may be used for other purposes; ask your health care provider or pharmacist if you have questions. COMMON BRAND NAME(S): [facility] What should I tell my care team before I take this medication? They need to know if you have any of these conditions: cancer immune system problems an unusual or allergic reaction to Zoster vaccine, other medications, foods, dyes, or preservatives pregnant or trying to get pregnant breast-feeding How should I use this medication? This vaccine is injected into a muscle. It is given by a health care provider. A copy of Vaccine Information Statements will be given before each vaccination. Be sure to read this information carefully each time. This sheet may change often. Talk to your health care provider about the use of this vaccine in children. This vaccine is not approved for use in children. Overdosage: If you think you have taken too much of this medicine contact a poison control center or emergency room at once. NOTE: This medicine is only for you. Do not share this medicine with others. What if I miss a dose? Keep appointments for follow-up (booster) doses. It is important not to miss your dose. Call your health care provider if you are unable to keep an appointment. What may interact with this  medication? medicines that suppress your immune system medicines to treat cancer steroid medicines like prednisone or cortisone This list may not describe all possible interactions. Give your health care provider a list of all the medicines, herbs, non-prescription drugs, or dietary supplements you use. Also tell them if you smoke, drink alcohol, or use illegal drugs. Some items may interact with your medicine. What should I watch for while using this medication? Visit your health care provider regularly. This vaccine, like all vaccines, may not fully protect everyone. What side effects may I notice from receiving this medication? Side effects that you should report to your doctor or health care professional as soon as possible: allergic reactions (skin rash, itching or hives; swelling of the face, lips, or tongue) trouble breathing Side effects that usually do not require medical attention (report these to your doctor or health care professional if they continue or are bothersome): chills headache fever nausea pain, redness, or irritation at site where injected tiredness vomiting This list may not describe all possible side effects. Call your doctor for medical advice about side effects. You may report side effects to FDA at 1-800-FDA-1088. Where should I keep my medication? This vaccine is only given by a health care provider. It will not be stored at home. NOTE: This sheet is a summary. It may not cover all possible information. If you have questions about this medicine, talk to your doctor, pharmacist, or health care provider.  2022 Elsevier/Gold Standard (2019-04-28 16:23:07)

## 2021-01-09 ENCOUNTER — Encounter: Payer: Self-pay | Admitting: Rheumatology

## 2021-01-09 ENCOUNTER — Other Ambulatory Visit: Payer: Self-pay

## 2021-01-09 ENCOUNTER — Ambulatory Visit: Payer: 59 | Admitting: Rheumatology

## 2021-01-09 VITALS — BP 113/73 | HR 59 | Resp 15 | Ht 65.0 in | Wt 148.2 lb

## 2021-01-09 DIAGNOSIS — E559 Vitamin D deficiency, unspecified: Secondary | ICD-10-CM

## 2021-01-09 DIAGNOSIS — Z8669 Personal history of other diseases of the nervous system and sense organs: Secondary | ICD-10-CM

## 2021-01-09 DIAGNOSIS — M797 Fibromyalgia: Secondary | ICD-10-CM

## 2021-01-09 DIAGNOSIS — R5383 Other fatigue: Secondary | ICD-10-CM

## 2021-01-09 DIAGNOSIS — M818 Other osteoporosis without current pathological fracture: Secondary | ICD-10-CM | POA: Diagnosis not present

## 2021-01-09 DIAGNOSIS — M47816 Spondylosis without myelopathy or radiculopathy, lumbar region: Secondary | ICD-10-CM

## 2021-01-09 DIAGNOSIS — M4185 Other forms of scoliosis, thoracolumbar region: Secondary | ICD-10-CM | POA: Diagnosis not present

## 2021-01-09 DIAGNOSIS — M19072 Primary osteoarthritis, left ankle and foot: Secondary | ICD-10-CM

## 2021-01-09 DIAGNOSIS — M19041 Primary osteoarthritis, right hand: Secondary | ICD-10-CM

## 2021-01-09 DIAGNOSIS — M5030 Other cervical disc degeneration, unspecified cervical region: Secondary | ICD-10-CM | POA: Diagnosis not present

## 2021-01-09 DIAGNOSIS — M21371 Foot drop, right foot: Secondary | ICD-10-CM

## 2021-01-09 DIAGNOSIS — M51369 Other intervertebral disc degeneration, lumbar region without mention of lumbar back pain or lower extremity pain: Secondary | ICD-10-CM | POA: Insufficient documentation

## 2021-01-09 DIAGNOSIS — M3500 Sicca syndrome, unspecified: Secondary | ICD-10-CM

## 2021-01-09 DIAGNOSIS — M19071 Primary osteoarthritis, right ankle and foot: Secondary | ICD-10-CM

## 2021-01-09 DIAGNOSIS — M5136 Other intervertebral disc degeneration, lumbar region: Secondary | ICD-10-CM

## 2021-01-09 DIAGNOSIS — M19042 Primary osteoarthritis, left hand: Secondary | ICD-10-CM

## 2021-01-09 NOTE — Patient Instructions (Signed)
Cervical Strain and Sprain Rehab ?Ask your health care provider which exercises are safe for you. Do exercises exactly as told by your health care provider and adjust them as directed. It is normal to feel mild stretching, pulling, tightness, or discomfort as you do these exercises. Stop right away if you feel sudden pain or your pain gets worse. Do not begin these exercises until told by your health care provider. ?Stretching and range-of-motion exercises ?Cervical side bending ? ?Using good posture, sit on a stable chair or stand up. ?Without moving your shoulders, slowly tilt your left / right ear to your shoulder until you feel a stretch in the opposite side neck muscles. You should be looking straight ahead. ?Hold for __________ seconds. ?Repeat with the other side of your neck. ?Repeat __________ times. Complete this exercise __________ times a day. ?Cervical rotation ? ?Using good posture, sit on a stable chair or stand up. ?Slowly turn your head to the side as if you are looking over your left / right shoulder. ?Keep your eyes level with the ground. ?Stop when you feel a stretch along the side and the back of your neck. ?Hold for __________ seconds. ?Repeat this by turning to your other side. ?Repeat __________ times. Complete this exercise __________ times a day. ?Thoracic extension and pectoral stretch ?Roll a towel or a small blanket so it is about 4 inches (10 cm) in diameter. ?Lie down on your back on a firm surface. ?Put the towel lengthwise, under your spine in the middle of your back. It should not be under your shoulder blades. The towel should line up with your spine from your middle back to your lower back. ?Put your hands behind your head and let your elbows fall out to your sides. ?Hold for __________ seconds. ?Repeat __________ times. Complete this exercise __________ times a day. ?Strengthening exercises ?Isometric upper cervical flexion ?Lie on your back with a thin pillow behind your head  and a small rolled-up towel under your neck. ?Gently tuck your chin toward your chest and nod your head down to look toward your feet. Do not lift your head off the pillow. ?Hold for __________ seconds. ?Release the tension slowly. Relax your neck muscles completely before you repeat this exercise. ?Repeat __________ times. Complete this exercise __________ times a day. ?Isometric cervical extension ? ?Stand about 6 inches (15 cm) away from a wall, with your back facing the wall. ?Place a soft object, about 6-8 inches (15-20 cm) in diameter, between the back of your head and the wall. A soft object could be a small pillow, a ball, or a folded towel. ?Gently tilt your head back and press into the soft object. Keep your jaw and forehead relaxed. ?Hold for __________ seconds. ?Release the tension slowly. Relax your neck muscles completely before you repeat this exercise. ?Repeat __________ times. Complete this exercise __________ times a day. ?Posture and body mechanics ?Body mechanics refers to the movements and positions of your body while you do your daily activities. Posture is part of body mechanics. Good posture and healthy body mechanics can help to relieve stress in your body's tissues and joints. Good posture means that your spine is in its natural S-curve position (your spine is neutral), your shoulders are pulled back slightly, and your head is not tipped forward. The following are general guidelines for applying improved posture and body mechanics to your everyday activities. ?Sitting ? ?When sitting, keep your spine neutral and keep your feet flat on the floor.   Use a footrest, if necessary, and keep your thighs parallel to the floor. Avoid rounding your shoulders, and avoid tilting your head forward. ?When working at a desk or a computer, keep your desk at a height where your hands are slightly lower than your elbows. Slide your chair under your desk so you are close enough to maintain good posture. ?When  working at a computer, place your monitor at a height where you are looking straight ahead and you do not have to tilt your head forward or downward to look at the screen. ?Standing ? ?When standing, keep your spine neutral and keep your feet about hip-width apart. Keep a slight bend in your knees. Your ears, shoulders, and hips should line up. ?When you do a task in which you stand in one place for a long time, place one foot up on a stable object that is 2-4 inches (5-10 cm) high, such as a footstool. This helps keep your spine neutral. ?Resting ?When lying down and resting, avoid positions that are most painful for you. Try to support your neck in a neutral position. You can use a contour pillow or a small rolled-up towel. Your pillow should support your neck but not push on it. ?This information is not intended to replace advice given to you by your health care provider. Make sure you discuss any questions you have with your health care provider. ?Document Revised: 07/13/2018 Document Reviewed: 12/22/2017 ?Elsevier Patient Education ? 2022 [employer] ? ?

## 2021-04-06 DIAGNOSIS — J189 Pneumonia, unspecified organism: Secondary | ICD-10-CM

## 2021-04-06 HISTORY — DX: Pneumonia, unspecified organism: J18.9

## 2021-04-09 ENCOUNTER — Ambulatory Visit: Payer: 59 | Admitting: Family Medicine

## 2021-04-18 ENCOUNTER — Encounter (HOSPITAL_BASED_OUTPATIENT_CLINIC_OR_DEPARTMENT_OTHER): Payer: Self-pay

## 2021-04-18 ENCOUNTER — Other Ambulatory Visit: Payer: Self-pay

## 2021-04-18 ENCOUNTER — Emergency Department (HOSPITAL_BASED_OUTPATIENT_CLINIC_OR_DEPARTMENT_OTHER)
Admission: EM | Admit: 2021-04-18 | Discharge: 2021-04-18 | Disposition: A | Discharge status: Home / Self Care | Payer: 59 | Attending: Emergency Medicine | Admitting: Emergency Medicine

## 2021-04-18 ENCOUNTER — Emergency Department (HOSPITAL_BASED_OUTPATIENT_CLINIC_OR_DEPARTMENT_OTHER): Payer: 59

## 2021-04-18 DIAGNOSIS — Z20822 Contact with and (suspected) exposure to covid-19: Secondary | ICD-10-CM | POA: Insufficient documentation

## 2021-04-18 DIAGNOSIS — J181 Lobar pneumonia, unspecified organism: Secondary | ICD-10-CM | POA: Insufficient documentation

## 2021-04-18 DIAGNOSIS — J189 Pneumonia, unspecified organism: Secondary | ICD-10-CM

## 2021-04-18 DIAGNOSIS — R059 Cough, unspecified: Secondary | ICD-10-CM | POA: Diagnosis present

## 2021-04-18 LAB — RESP PANEL BY RT-PCR (FLU A&B, COVID) ARPGX2
Influenza A by PCR: NEGATIVE
Influenza B by PCR: NEGATIVE
SARS Coronavirus 2 by RT PCR: NEGATIVE

## 2021-04-18 MED ORDER — BENZONATATE 100 MG PO CAPS: 100.0000 mg | ORAL_CAPSULE | Freq: Three times a day (TID) | ORAL | 0 refills | Status: DC

## 2021-04-18 MED ORDER — FLUTICASONE PROPIONATE 50 MCG/ACT NA SUSP: 2.0000 | Freq: Every day | NASAL | 0 refills | Status: DC

## 2021-04-18 MED ORDER — AZITHROMYCIN 250 MG PO TABS: 250.0000 mg | ORAL_TABLET | Freq: Every day | ORAL | 0 refills | Status: DC

## 2021-04-18 NOTE — ED Provider Notes (Signed)
[facility] Provider Note   CSN: [id number] Arrival date & time: 04/18/21  1456     History  Chief Complaint  Patient presents with   Cough    Alexis Hicks is a 61 y.o. female here for evaluation of cough, scratchy throat, congestion, rhinorrhea, bilateral ear pain.  Symptoms started 2 weeks ago.  Husband had similar complaints at that time.  Thought she was getting better, symptoms improved however worsened over the last week.  Cough now productive of green, yellow sputum.  Using OTC throat lozenges.  Tolerating p.o. intake.  No fever, chest pain, shortness of breath, back pain, abdominal pain, emesis, lower extremity swelling.  No hemoptysis.  HPI     Home Medications Prior to Admission medications   Medication Sig Start Date End Date Taking? Authorizing Provider  azithromycin (ZITHROMAX) 250 MG tablet Take 1 tablet (250 mg total) by mouth daily. Take first 2 tablets together, then 1 every day until finished. 04/18/21  Yes Layli Capshaw A, PA-C  benzonatate (TESSALON) 100 MG capsule Take 1 capsule (100 mg total) by mouth every 8 (eight) hours. 04/18/21  Yes Hakiem Malizia A, PA-C  fluticasone (FLONASE) 50 MCG/ACT nasal spray Place 2 sprays into both nostrils daily. 04/18/21  Yes Ji Fairburn A, PA-C  ascorbic Acid (VITAMIN C) 500 MG CPCR as needed.    [provider]  Biotin 1 MG CAPS Take by mouth.    [provider]  Calcium Carbonate-Vitamin D (CALCIUM 600+D HIGH POTENCY PO) calcium    [provider]  cholecalciferol (VITAMIN D) 1000 units tablet Take 2,000 Units by mouth daily.     [provider]  Cyanocobalamin (VITAMIN B 12 PO) Take by mouth.    [provider]  cycloSPORINE (RESTASIS) 0.05 % ophthalmic emulsion 1 drop daily.    [provider]  risedronate (ACTONEL) 150 MG tablet Take 150 mg by mouth every 30 (thirty) days. 01/11/20   [provider]  triamcinolone  cream (KENALOG) 0.1 % triamcinolone acetonide 0.1 % topical cream  APPLY TO SKIN TWICE DAILY X 14 DAYS    [provider]  Zinc 10 MG LOZG Use as directed in the mouth or throat.    [provider]      Allergies    Patient has no known allergies.    Review of Systems   Review of Systems  Constitutional:  Positive for fatigue. Negative for fever.  HENT:  Positive for congestion, ear pain, sinus pressure, sinus pain and sore throat. Negative for trouble swallowing and voice change.   Respiratory:  Positive for cough. Negative for shortness of breath, wheezing and stridor.   Cardiovascular: Negative.   Genitourinary: Negative.   Musculoskeletal: Negative.   Skin: Negative.   Neurological: Negative.    Physical Exam Updated Vital Signs BP 118/79 (BP Location: Left Arm)    Pulse 84    Temp 98.7 F (37.1 C) (Oral)    Resp 18    Ht 5\' 5"  (1.651 m)    Wt 67.1 kg    SpO2 100%    BMI 24.63 kg/m  Physical Exam Vitals and nursing note reviewed.  Constitutional:      General: She is not in acute distress.    Appearance: She is not ill-appearing, toxic-appearing or diaphoretic.  HENT:     Head: Normocephalic and atraumatic.     Jaw: There is normal jaw occlusion.     Right Ear: Tympanic membrane, ear canal and external  ear normal. There is no impacted cerumen. No hemotympanum. Tympanic membrane is not injected, scarred, perforated, erythematous, retracted or bulging.     Left Ear: Tympanic membrane, ear canal and external ear normal. There is no impacted cerumen. No hemotympanum. Tympanic membrane is not injected, scarred, perforated, erythematous, retracted or bulging.     Ears:     Comments: No Mastoid tenderness.    Nose: Congestion and rhinorrhea present.     Comments: Clear rhinorrhea and congestion to bilateral nares.  No sinus tenderness.    Mouth/Throat:     Mouth: Mucous membranes are moist.     Comments: Posterior oropharynx clear.  Mucous membranes moist.   Tonsils without erythema or exudate.  Uvula midline without deviation.  No evidence of PTA or RPA.  No drooling, dysphasia or trismus.  Phonation normal. Neck:     Trachea: Trachea and phonation normal.     Meningeal: Brudzinski's sign and Kernig's sign absent.     Comments: No Neck stiffness or neck rigidity.  No meningismus.  No cervical lymphadenopathy. Cardiovascular:     Pulses: Normal pulses.     Heart sounds: Normal heart sounds.     Comments: No murmurs rubs or gallops. Pulmonary:     Effort: Pulmonary effort is normal.     Comments: Mild rhonchi right lower lobe. No accessory muscle usage.  Able speak in full sentences. Abdominal:     Comments: Soft, nontender without rebound or guarding.  No CVA tenderness.  Musculoskeletal:        General: Normal range of motion.     Comments: Moves all 4 extremities without difficulty.  Lower extremities without edema, erythema or warmth.  Skin:    General: Skin is warm.     Capillary Refill: Capillary refill takes less than 2 seconds.     Comments: Brisk capillary refill.  No rashes or lesions.  Neurological:     General: No focal deficit present.     Mental Status: She is alert and oriented to person, place, and time.     Comments: Ambulatory in department without difficulty.  Cranial nerves II through XII grossly intact.  No facial droop.  No aphasia.    ED Results / Procedures / Treatments   Labs (all labs ordered are listed, but only abnormal results are displayed) Labs Reviewed  RESP PANEL BY RT-PCR (FLU A&B, COVID) [id number]    EKG None  Radiology DG Chest 2 View  Result Date: 04/18/2021 CLINICAL DATA:  cough x weeks EXAM: CHEST - 2 VIEW COMPARISON:  None. FINDINGS: The cardiomediastinal silhouette is within normal limits. There is central bronchial wall thickening. There is a a small right basilar opacity and a faint right apical opacity. No pleural effusion. No pneumothorax. Bilateral shoulder degenerative changes. No acute  osseous abnormality. Thoracic spondylosis. IMPRESSION: Small right basilar opacity and faint right apical opacity, could represent developing infection. Recommend follow-up PA and lateral chest radiograph in 6-12 weeks. Electronically Signed   By: Maurine Simmering M.D.   On: 04/18/2021 16:18    Procedures Procedures    Medications Ordered in ED Medications - No data to display  ED Course/ Medical Decision Making/ A&P    61 year old here for evaluation of cough for the last 2 and half weeks.  Initially was getting better however worsened over the last week.  Has been had similar complaints at onset.  She is afebrile, nonseptic, not ill-appearing.  Bilateral ears evidence of otitis.  No neck stiffness or neck rigidity.  Posterior oropharynx clear.  No evidence of PTA, RPA, suspicion for strep pharyngitis.  Heart clear.  Mild rhonchi right lower lobe.  Abdomen soft, nontender.  No clinical evidence of VTE on exam.  Labs and imaging personally reviewed and interpreted COVID, flu negative Chest x-ray with developing right lower lobe pneumonia  Discussed results with patient and husband in room.  Suspect CAP.  DC home with symptomatic management, close follow-up with PCP, return for new or worsening symptoms.  Patient agreeable.  Patient without a tachycardia, tachypnea or hypoxia here in ED, tolerating intake, do not feel she needs inpatient admission at this time.  Low suspicion for ACS, PE, sepsis.  The patient has been appropriately medically screened and/or stabilized in the ED. I have low suspicion for any other emergent medical condition which would require further screening, evaluation or treatment in the ED or require inpatient management.  Patient is hemodynamically stable and in no acute distress.  Patient able to ambulate in department prior to ED.  Evaluation does not show acute pathology that would require ongoing or additional emergent interventions while in the emergency department or  further inpatient treatment.  I have discussed the diagnosis with the patient and answered all questions.  Pain is been managed while in the emergency department and patient has no further complaints prior to discharge.  Patient is comfortable with plan discussed in room and is stable for discharge at this time.  I have discussed strict return precautions for returning to the emergency department.  Patient was encouraged to follow-up with PCP/specialist refer to at discharge.                             Medical Decision Making Amount and/or Complexity of Data Reviewed Independent Historian: spouse Labs: ordered. Decision-making details documented in ED Course. Radiology: ordered and independent interpretation performed. Decision-making details documented in ED Course.  Risk OTC drugs. Prescription drug management. Risk Details: none           Final Clinical Impression(s) / ED Diagnoses Final diagnoses:  Community acquired pneumonia of right lower lobe of lung    Rx / DC Orders ED Discharge Orders          Ordered    azithromycin (ZITHROMAX) 250 MG tablet  Daily        04/18/21 1646    benzonatate (TESSALON) 100 MG capsule  Every 8 hours        04/18/21 1646    fluticasone (FLONASE) 50 MCG/ACT nasal spray  Daily        04/18/21 1646              Josaphine Shimamoto A, PA-C 04/18/21 1651    Hayden Rasmussen, MD 04/19/21 1022

## 2021-04-18 NOTE — ED Triage Notes (Signed)
Pt c/o flu like sx started 03/31/21-NAD-steady gait

## 2021-04-18 NOTE — Discharge Instructions (Signed)
It was a pleasure taking care of you here in the emergency department today  Your chest x-ray showed a developing pneumonia.  I placed on antibiotics, azithromycin for this.  Please take as prescribed.  Also prescribed Tessalon Perles for your cough as well as Flonase for runny nose.  You may take Tylenol, Motrin as needed for any pain you may have  Follow-up with primary care provider next week for reassessment  Return for new or worsening symptoms

## 2021-04-23 ENCOUNTER — Ambulatory Visit: Payer: 59 | Admitting: Family Medicine

## 2021-04-23 ENCOUNTER — Encounter: Payer: Self-pay | Admitting: Family Medicine

## 2021-04-23 VITALS — BP 122/60 | HR 81 | Temp 98.1°F | Resp 16 | Ht 65.0 in | Wt 148.4 lb

## 2021-04-23 DIAGNOSIS — J189 Pneumonia, unspecified organism: Secondary | ICD-10-CM | POA: Diagnosis not present

## 2021-04-23 DIAGNOSIS — R059 Cough, unspecified: Secondary | ICD-10-CM | POA: Diagnosis not present

## 2021-04-23 MED ORDER — ALBUTEROL SULFATE HFA 108 (90 BASE) MCG/ACT IN AERS: 2.0000 | INHALATION_SPRAY | Freq: Four times a day (QID) | RESPIRATORY_TRACT | 0 refills | Status: DC | PRN

## 2021-04-23 MED ORDER — PROMETHAZINE-DM 6.25-15 MG/5ML PO SYRP: 2.5000 mL | ORAL_SOLUTION | Freq: Every evening | ORAL | 0 refills | Status: DC | PRN

## 2021-04-23 NOTE — Progress Notes (Signed)
Subjective:  Patient ID: Alexis Hicks, female    DOB: 04-06-1961  Age: 61 y.o. MRN: [id number]  CC:  Chief Complaint  Patient presents with   Follow-up    Pt reports end of last month had swollen glands and cough starting 1 week ago, was unable to get in with [place] went to urgent care, begning pneumonia given abx and has finished     HPI Alexis Hicks presents for   Community-acquired pneumonia, right lower lobe. Evaluated through [facility] on January 13.  2-week history of congestion, rhinorrhea, sore throat, cough and bilateral ear pain.  Some initial improvement,then worsening for 1 week. Respiratory panel negative for COVID, flu.  Chest x-ray with small right basilar opacity, faint right apical opacity could represent developing infection.  6 to 12-week follow-up chest x-ray recommended.  Was started on azithromycin, Z-Pak (last dose), Tessalon, fluticasone nasal spray.  Still coughing. Some productive phlegm - clear -white. Overall cough feels about the same. Coughing fits at night,day prior. Still some coughing fits - minimally less.  Tessalon taking at night only.  Flonase, saline ns, zinc, vit C, honey. No nsaids today.  No fever.  No dyspnea.  Eating/drinking ok.  Cough impacting sleep - sleeps sitting up, waking up with cough. Question of cough variant asthma on prior appts. No usual cough prior to current illness - rare cough with certain scents/exposure, weather change, laughter at times.   Sore in ribs with coughing fits.  No wheeze.    History Patient Active Problem List   Diagnosis Date Noted   DDD (degenerative disc disease), lumbar 01/09/2021   Right foot drop 01/09/2021   Other osteoporosis without current pathological fracture 01/09/2021   Osteopenia of multiple sites 08/25/2016   Vitamin D deficiency 08/25/2016   Fatigue 02/17/2016   Primary osteoarthritis of both feet 02/17/2016   DDD Cspine 02/17/2016   Osteoarthritis of lumbar  spine 02/17/2016   Weakness of right lower extremity 02/17/2016   Migraine without status migrainosus, not intractable 02/17/2016   Raynaud's  02/17/2016   Sicca syndrome ([place]) 02/17/2016   DUB (dysfunctional uterine bleeding)    Fibromyalgia syndrome 09/10/2011   Primary osteoarthritis of both hands 09/10/2011   Scoliosis 09/10/2011   Past Medical History:  Diagnosis Date   Chronically dry eyes, right    Dry mouth    DUB (dysfunctional uterine bleeding)    Fibromyalgia    Osteoporosis 04/2018   Dr. Butler [place]   Raynaud's syndrome    Past Surgical History:  Procedure Laterality Date   SALPINGECTOMY     No Known Allergies Prior to Admission medications   Medication Sig Start Date End Date Taking? Authorizing Provider  ascorbic Acid (VITAMIN C) 500 MG CPCR as needed.   Yes [provider]  benzonatate (TESSALON) 100 MG capsule Take 1 capsule (100 mg total) by mouth every 8 (eight) hours. 04/18/21  Yes Henderly, Britni A, PA-C  Biotin 1 MG CAPS Take by mouth.   Yes [provider]  Calcium Carbonate-Vitamin D (CALCIUM 600+D HIGH POTENCY PO) calcium   Yes [provider]  cholecalciferol (VITAMIN D) 1000 units tablet Take 2,000 Units by mouth daily.    Yes [provider]  Cyanocobalamin (VITAMIN B 12 PO) Take by mouth.   Yes [provider]  cycloSPORINE (RESTASIS) 0.05 % ophthalmic emulsion 1 drop daily.   Yes [provider]  fluticasone (FLONASE) 50 MCG/ACT nasal spray Place 2 sprays into both nostrils daily. 04/18/21  Yes Henderly, Britni A, PA-C  risedronate (ACTONEL) 150 MG tablet Take 150 mg by mouth every 30 (thirty) days. 01/11/20  Yes [provider]  triamcinolone cream (KENALOG) 0.1 % triamcinolone acetonide 0.1 % topical cream  APPLY TO SKIN TWICE DAILY X 14 DAYS   Yes [provider]  Zinc 10 MG LOZG Use as directed in the mouth or throat.   Yes [provider]  azithromycin (ZITHROMAX) 250 MG  tablet Take 1 tablet (250 mg total) by mouth daily. Take first 2 tablets together, then 1 every day until finished. Patient not taking: Reported on 04/23/2021 04/18/21   Henderly, Britni A, PA-C   Social History   Socioeconomic History   Marital status: Married    Spouse name: Not on file   Number of children: Not on file   Years of education: Not on file   Highest education level: Not on file  Occupational History   Not on file  Tobacco Use   Smoking status: Never   Smokeless tobacco: Never  Vaping Use   Vaping Use: Never used  Substance and Sexual Activity   Alcohol use: Yes    Comment: occ   Drug use: No   Sexual activity: Not on file  Other Topics Concern   Not on file  Social History Narrative   Not on file   Social Determinants of Health   Financial Resource Strain: Not on file  Food Insecurity: Not on file  Transportation Needs: Not on file  Physical Activity: Not on file  Stress: Not on file  Social Connections: Not on file  Intimate Partner Violence: Not on file    Review of Systems   Objective:   Vitals:   04/23/21 1348  BP: 122/60  Pulse: 81  Resp: 16  Temp: 98.1 F (36.7 C)  TempSrc: Temporal  SpO2: 98%  Weight: 148 lb 6.4 oz (67.3 kg)  Height: 5\' 5"  (1.651 m)     Physical Exam Vitals reviewed.  Constitutional:      General: She is not in acute distress.    Appearance: Normal appearance. She is well-developed. She is not toxic-appearing or diaphoretic.  HENT:     Head: Normocephalic and atraumatic.  Eyes:     Conjunctiva/sclera: Conjunctivae normal.     Pupils: Pupils are equal, round, and reactive to light.  Neck:     Vascular: No carotid bruit.  Cardiovascular:     Rate and Rhythm: Normal rate and regular rhythm.     Heart sounds: Normal heart sounds.  Pulmonary:     Effort: Pulmonary effort is normal. No respiratory distress.     Breath sounds: Rhonchi (Few coarse breath sounds right lower lobe, right upper lobe.  No expiratory  wheezes appreciated.) present. No wheezing.  Abdominal:     Palpations: Abdomen is soft. There is no pulsatile mass.     Tenderness: There is no abdominal tenderness.  Musculoskeletal:     Right lower leg: No edema.     Left lower leg: No edema.  Skin:    General: Skin is warm and dry.  Neurological:     Mental Status: She is alert and oriented to person, place, and time.  Psychiatric:        Mood and Affect: Mood normal.        Behavior: Behavior normal.       Assessment & Plan:  Alexis Hicks is a 61 y.o. female . Pneumonia of right lower lobe due to infectious organism -  Plan: promethazine-dextromethorphan (PROMETHAZINE-DM) 6.25-15 MG/5ML syrup, DG Chest 2 View  Cough, unspecified type - Plan: promethazine-dextromethorphan (PROMETHAZINE-DM) 6.25-15 MG/5ML syrup, albuterol (VENTOLIN HFA) 108 (90 Base) MCG/ACT inhaler  Likely early right lower lobe pneumonia on imaging as above, status post azithromycin.  Afebrile, O2 sat stable.  Minimal coarse breath sounds on exam.  Persistent coughing fits, could have component of reactive airway as concern for cough variant asthma discussed last year.  No true wheeze.  -Option of albuterol during coughing fits and then to advise me if she does find relief with that medication, could consider Flovent inhaler as previously planned.  -Tessalon or Mucinex DM during the day for cough, Phenergan DM provided for nighttime suppression.  -Repeat chest x-ray in approximately 5 weeks, RTC precautions if not improving in the next week or acute worsening symptoms.  Meds ordered this encounter  Medications   promethazine-dextromethorphan (PROMETHAZINE-DM) 6.25-15 MG/5ML syrup    Sig: Take 2.5-5 mLs by mouth at bedtime as needed for cough.    Dispense:  118 mL    Refill:  0   albuterol (VENTOLIN HFA) 108 (90 Base) MCG/ACT inhaler    Sig: Inhale 2 puffs into the lungs every 6 (six) hours as needed for wheezing or shortness of breath.    Dispense:   8 g    Refill:  0   Patient Instructions  Mucinex DM during the day ok or tessalon perles. I prescribed phenergan DM syrup at night if needed to sleep.  If fever, shortness of breath or any worsening - be seen for possible repeat xray.  If wheezing, or continued coughing fits can try albuterol inhaler - let me know if this is used.  Fluids, rest and I expect you to be getting better in next week.  Repeat chest x-ray has been ordered in the next 5 weeks at the [facility] location - should not need an appointment.  Community-Acquired Pneumonia, Adult Pneumonia is a lung infection that causes inflammation and the buildup of mucus and fluids in the lungs. This may cause coughing and difficulty breathing. Community-acquired pneumonia is pneumonia that develops in people who are not, and have not recently been, in a hospital or other health care facility. Usually, pneumonia develops as a result of an illness that is caused by a virus, such as the common cold and the flu (influenza). It can also be caused by bacteria or fungi. While the common cold and influenza can pass from person to person (are contagious), pneumonia itself is not considered contagious. What are the causes? This condition may be caused by: Viruses. Bacteria. Fungi, such as molds or mushrooms. What increases the risk? The following factors may make you more likely to develop this condition: Having certain medical conditions, such as: A long-term (chronic) disease, which may include chronic obstructive pulmonary disease (COPD), asthma, heart failure, cystic fibrosis, diabetes, kidney disease, sickle cell disease, and human immunodeficiency virus (HIV). A condition that increases the risk of breathing in (aspirating) mucus and other fluids from your mouth and nose. A weakened body defense system (immune system). Having had your spleen removed (splenectomy). The spleen is the organ that helps fight germs and infections. Not  cleaning your teeth and gums well (poor dental hygiene). Using tobacco products. Traveling to places where germs that cause pneumonia are present. Being near certain animals, or animal habitats, that have germs that cause pneumonia. Being older than 63 years of age. What are the signs or symptoms? Symptoms of this condition include: A  dry cough or a wet (productive) cough. A fever. Sweating or chills. Chest pain, especially when breathing deeply or coughing. Fast breathing, difficulty breathing, or shortness of breath. Tiredness (fatigue). Muscle aches. How is this diagnosed? This condition may be diagnosed based on your medical history or a physical exam. You may also have tests, including: Chest X-rays. Tests of the level of oxygen and other gases in your blood. Tests of: Your blood. Mucus from your lungs (sputum). Fluid around your lungs (pleural fluid). Your urine. If your pneumonia is severe, other tests may be done to learn more about the cause. How is this treated? Treatment for this condition depends on many factors, such as the cause of your pneumonia, your medicines, and other medical conditions that you have. For most adults, pneumonia may be treated at home. In some cases, treatment must happen in a hospital and may include: Medicines that are given by mouth (orally) or through an IV, including: Antibiotic medicines, if bacteria caused the pneumonia. Medicines that kill viruses (antiviral medicines), if a virus caused the pneumonia. Oxygen therapy. Severe pneumonia, although rare, may require the following treatments: Mechanical ventilation.This procedure uses a machine to help you breathe if you cannot breathe well on your own or maintain a safe level of blood oxygen. Thoracentesis. This procedure removes any buildup of pleural fluid to help with breathing. Follow these instructions at home: Medicines Take over-the-counter and prescription medicines only as told by  your health care provider. Take cough medicine only if you have trouble sleeping. Cough medicine can prevent your body from removing mucus from your lungs. If you were prescribed an antibiotic medicine, take it as told by your health care provider. Do not stop taking the antibiotic even if you start to feel better. Lifestyle   Do not drink alcohol. Do not use any products that contain nicotine or tobacco, such as cigarettes, e-cigarettes, and chewing tobacco. If you need help quitting, ask your health care provider. Eat a healthy diet. This includes plenty of vegetables, fruits, whole grains, low-fat dairy products, and lean protein. General instructions Rest a lot and get at least 8 hours of sleep each night. Sleep in a partly upright position at night. Place a few pillows under your head or sleep in a reclining chair. Return to your normal activities as told by your health care provider. Ask your health care provider what activities are safe for you. Drink enough fluid to keep your urine pale yellow. This helps to thin the mucus in your lungs. If your throat is sore, gargle with a salt-water mixture 3-4 times a day or as needed. To make a salt-water mixture, completely dissolve -1 tsp (3-6 g) of salt in 1 cup (237 mL) of warm water. Keep all follow-up visits as told by your health care provider. This is important. How is this prevented? You can lower your risk of developing community-acquired pneumonia by: Getting the pneumonia vaccine. There are different types and schedules of pneumonia vaccines. Ask your health care provider which option is best for you. Consider getting the pneumonia vaccine if: You are older than 63 years of age. You are 12-65 years of age and are receiving cancer treatment, have chronic lung disease, or have other medical conditions that affect your immune system. Ask your health care provider if this applies to you. Getting your influenza vaccine every year. Ask your  health care provider which type of vaccine is best for you. Getting regular dental checkups. Washing your hands often with  soap and water for at least 20 seconds. If soap and water are not available, use hand sanitizer. Contact a health care provider if you have: A fever. Trouble sleeping because you cannot control your cough with cough medicine. Get help right away if: Your shortness of breath becomes worse. Your chest pain increases. Your sickness becomes worse, especially if you are an older adult or have a weak immune system. You cough up blood. These symptoms may represent a serious problem that is an emergency. Do not wait to see if the symptoms will go away. Get medical help right away. Call your local emergency services (911 in the [place] Do not drive yourself to the hospital. Summary Pneumonia is an infection of the lungs. Community-acquired pneumonia develops in people who have not been in the hospital. It can be caused by bacteria, viruses, or fungi. This condition may be treated with antibiotics or antiviral medicines. Severe pneumonia may require a hospital stay and treatment to help with breathing. This information is not intended to replace advice given to you by your health care provider. Make sure you discuss any questions you have with your health care provider. Document Revised: 01/03/2019 Document Reviewed: 01/03/2019 Elsevier Patient Education  [place].   Cough, Adult Coughing is a reflex that clears your throat and your airways (respiratory system). Coughing helps to heal and protect your lungs. It is normal to cough occasionally, but a cough that happens with other symptoms or lasts a long time may be a sign of a condition that needs treatment. An acute cough may only last 2-3 weeks, while a chronic cough may last 8 or more weeks. Coughing is commonly caused by: Infection of the respiratory systemby viruses or bacteria. Breathing in substances that irritate  your lungs. Allergies. Asthma. Mucus that runs down the back of your throat (postnasal drip). Smoking. Acid backing up from the stomach into the esophagus (gastroesophageal reflux). Certain medicines. Chronic lung problems. Other medical conditions such as heart failure or a blood clot in the lung (pulmonary embolism). Follow these instructions at home: Medicines Take over-the-counter and prescription medicines only as told by your health care provider. Talk with your health care provider before you take a cough suppressant medicine. Lifestyle  Avoid cigarette smoke. Do not use any products that contain nicotine or tobacco, such as cigarettes, e-cigarettes, and chewing tobacco. If you need help quitting, ask your health care provider. Drink enough fluid to keep your urine pale yellow. Avoid caffeine. Do not drink alcohol if your health care provider tells you not to drink. General instructions  Pay close attention to changes in your cough. Tell your health care provider about them. Always cover your mouth when you cough. Avoid things that make you cough, such as perfume, candles, cleaning products, or campfire or tobacco smoke. If the air is dry, use a cool mist vaporizer or humidifier in your bedroom or your home to help loosen secretions. If your cough is worse at night, try to sleep in a semi-upright position. Rest as needed. Keep all follow-up visits as told by your health care provider. This is important. Contact a health care provider if you: Have new symptoms. Cough up pus. Have a cough that does not get better after 2-3 weeks or gets worse. Cannot control your cough with cough suppressant medicines and you are losing sleep. Have pain that gets worse or pain that is not helped with medicine. Have a fever. Have unexplained weight loss. Have night sweats. Get help  right away if: You cough up blood. You have difficulty breathing. Your heartbeat is very fast. These  symptoms may represent a serious problem that is an emergency. Do not wait to see if the symptoms will go away. Get medical help right away. Call your local emergency services (911 in the [place] Do not drive yourself to the hospital. Summary Coughing is a reflex that clears your throat and your airways. It is normal to cough occasionally, but a cough that happens with other symptoms or lasts a long time may be a sign of a condition that needs treatment. Take over-the-counter and prescription medicines only as told by your health care provider. Always cover your mouth when you cough. Contact a health care provider if you have new symptoms or a cough that does not get better after 2-3 weeks or gets worse. This information is not intended to replace advice given to you by your health care provider. Make sure you discuss any questions you have with your health care provider. Document Revised: 04/11/2018 Document Reviewed: 04/11/2018 Elsevier Patient Education  2022 [place],   Merri Ray, MD [facility] 04/23/21 2:33 PM

## 2021-04-23 NOTE — Patient Instructions (Addendum)
Mucinex DM during the day ok or tessalon perles. I prescribed phenergan DM syrup at night if needed to sleep.  If fever, shortness of breath or any worsening - be seen for possible repeat xray.  If wheezing, or continued coughing fits can try albuterol inhaler - let me know if this is used.  Fluids, rest and I expect you to be getting better in next week.  Repeat chest x-ray has been ordered in the next 5 weeks at the [facility] location - should not need an appointment.  Community-Acquired Pneumonia, Adult Pneumonia is a lung infection that causes inflammation and the buildup of mucus and fluids in the lungs. This may cause coughing and difficulty breathing. Community-acquired pneumonia is pneumonia that develops in people who are not, and have not recently been, in a hospital or other health care facility. Usually, pneumonia develops as a result of an illness that is caused by a virus, such as the common cold and the flu (influenza). It can also be caused by bacteria or fungi. While the common cold and influenza can pass from person to person (are contagious), pneumonia itself is not considered contagious. What are the causes? This condition may be caused by: Viruses. Bacteria. Fungi, such as molds or mushrooms. What increases the risk? The following factors may make you more likely to develop this condition: Having certain medical conditions, such as: A long-term (chronic) disease, which may include chronic obstructive pulmonary disease (COPD), asthma, heart failure, cystic fibrosis, diabetes, kidney disease, sickle cell disease, and human immunodeficiency virus (HIV). A condition that increases the risk of breathing in (aspirating) mucus and other fluids from your mouth and nose. A weakened body defense system (immune system). Having had your spleen removed (splenectomy). The spleen is the organ that helps fight germs and infections. Not cleaning your teeth and gums well (poor dental  hygiene). Using tobacco products. Traveling to places where germs that cause pneumonia are present. Being near certain animals, or animal habitats, that have germs that cause pneumonia. Being older than 63 years of age. What are the signs or symptoms? Symptoms of this condition include: A dry cough or a wet (productive) cough. A fever. Sweating or chills. Chest pain, especially when breathing deeply or coughing. Fast breathing, difficulty breathing, or shortness of breath. Tiredness (fatigue). Muscle aches. How is this diagnosed? This condition may be diagnosed based on your medical history or a physical exam. You may also have tests, including: Chest X-rays. Tests of the level of oxygen and other gases in your blood. Tests of: Your blood. Mucus from your lungs (sputum). Fluid around your lungs (pleural fluid). Your urine. If your pneumonia is severe, other tests may be done to learn more about the cause. How is this treated? Treatment for this condition depends on many factors, such as the cause of your pneumonia, your medicines, and other medical conditions that you have. For most adults, pneumonia may be treated at home. In some cases, treatment must happen in a hospital and may include: Medicines that are given by mouth (orally) or through an IV, including: Antibiotic medicines, if bacteria caused the pneumonia. Medicines that kill viruses (antiviral medicines), if a virus caused the pneumonia. Oxygen therapy. Severe pneumonia, although rare, may require the following treatments: Mechanical ventilation.This procedure uses a machine to help you breathe if you cannot breathe well on your own or maintain a safe level of blood oxygen. Thoracentesis. This procedure removes any buildup of pleural fluid to help with breathing. Follow these  instructions at home: Medicines Take over-the-counter and prescription medicines only as told by your health care provider. Take cough medicine  only if you have trouble sleeping. Cough medicine can prevent your body from removing mucus from your lungs. If you were prescribed an antibiotic medicine, take it as told by your health care provider. Do not stop taking the antibiotic even if you start to feel better. Lifestyle   Do not drink alcohol. Do not use any products that contain nicotine or tobacco, such as cigarettes, e-cigarettes, and chewing tobacco. If you need help quitting, ask your health care provider. Eat a healthy diet. This includes plenty of vegetables, fruits, whole grains, low-fat dairy products, and lean protein. General instructions Rest a lot and get at least 8 hours of sleep each night. Sleep in a partly upright position at night. Place a few pillows under your head or sleep in a reclining chair. Return to your normal activities as told by your health care provider. Ask your health care provider what activities are safe for you. Drink enough fluid to keep your urine pale yellow. This helps to thin the mucus in your lungs. If your throat is sore, gargle with a salt-water mixture 3-4 times a day or as needed. To make a salt-water mixture, completely dissolve -1 tsp (3-6 g) of salt in 1 cup (237 mL) of warm water. Keep all follow-up visits as told by your health care provider. This is important. How is this prevented? You can lower your risk of developing community-acquired pneumonia by: Getting the pneumonia vaccine. There are different types and schedules of pneumonia vaccines. Ask your health care provider which option is best for you. Consider getting the pneumonia vaccine if: You are older than 63 years of age. You are 50-13 years of age and are receiving cancer treatment, have chronic lung disease, or have other medical conditions that affect your immune system. Ask your health care provider if this applies to you. Getting your influenza vaccine every year. Ask your health care provider which type of vaccine is best  for you. Getting regular dental checkups. Washing your hands often with soap and water for at least 20 seconds. If soap and water are not available, use hand sanitizer. Contact a health care provider if you have: A fever. Trouble sleeping because you cannot control your cough with cough medicine. Get help right away if: Your shortness of breath becomes worse. Your chest pain increases. Your sickness becomes worse, especially if you are an older adult or have a weak immune system. You cough up blood. These symptoms may represent a serious problem that is an emergency. Do not wait to see if the symptoms will go away. Get medical help right away. Call your local emergency services (911 in the [place] Do not drive yourself to the hospital. Summary Pneumonia is an infection of the lungs. Community-acquired pneumonia develops in people who have not been in the hospital. It can be caused by bacteria, viruses, or fungi. This condition may be treated with antibiotics or antiviral medicines. Severe pneumonia may require a hospital stay and treatment to help with breathing. This information is not intended to replace advice given to you by your health care provider. Make sure you discuss any questions you have with your health care provider. Document Revised: 01/03/2019 Document Reviewed: 01/03/2019 Elsevier Patient Education  [place].   Cough, Adult Coughing is a reflex that clears your throat and your airways (respiratory system). Coughing helps to heal and protect  your lungs. It is normal to cough occasionally, but a cough that happens with other symptoms or lasts a long time may be a sign of a condition that needs treatment. An acute cough may only last 2-3 weeks, while a chronic cough may last 8 or more weeks. Coughing is commonly caused by: Infection of the respiratory systemby viruses or bacteria. Breathing in substances that irritate your lungs. Allergies. Asthma. Mucus that runs  down the back of your throat (postnasal drip). Smoking. Acid backing up from the stomach into the esophagus (gastroesophageal reflux). Certain medicines. Chronic lung problems. Other medical conditions such as heart failure or a blood clot in the lung (pulmonary embolism). Follow these instructions at home: Medicines Take over-the-counter and prescription medicines only as told by your health care provider. Talk with your health care provider before you take a cough suppressant medicine. Lifestyle  Avoid cigarette smoke. Do not use any products that contain nicotine or tobacco, such as cigarettes, e-cigarettes, and chewing tobacco. If you need help quitting, ask your health care provider. Drink enough fluid to keep your urine pale yellow. Avoid caffeine. Do not drink alcohol if your health care provider tells you not to drink. General instructions  Pay close attention to changes in your cough. Tell your health care provider about them. Always cover your mouth when you cough. Avoid things that make you cough, such as perfume, candles, cleaning products, or campfire or tobacco smoke. If the air is dry, use a cool mist vaporizer or humidifier in your bedroom or your home to help loosen secretions. If your cough is worse at night, try to sleep in a semi-upright position. Rest as needed. Keep all follow-up visits as told by your health care provider. This is important. Contact a health care provider if you: Have new symptoms. Cough up pus. Have a cough that does not get better after 2-3 weeks or gets worse. Cannot control your cough with cough suppressant medicines and you are losing sleep. Have pain that gets worse or pain that is not helped with medicine. Have a fever. Have unexplained weight loss. Have night sweats. Get help right away if: You cough up blood. You have difficulty breathing. Your heartbeat is very fast. These symptoms may represent a serious problem that is an  emergency. Do not wait to see if the symptoms will go away. Get medical help right away. Call your local emergency services (911 in the [place] Do not drive yourself to the hospital. Summary Coughing is a reflex that clears your throat and your airways. It is normal to cough occasionally, but a cough that happens with other symptoms or lasts a long time may be a sign of a condition that needs treatment. Take over-the-counter and prescription medicines only as told by your health care provider. Always cover your mouth when you cough. Contact a health care provider if you have new symptoms or a cough that does not get better after 2-3 weeks or gets worse. This information is not intended to replace advice given to you by your health care provider. Make sure you discuss any questions you have with your health care provider. Document Revised: 04/11/2018 Document Reviewed: 04/11/2018 Elsevier Patient Education  [place].

## 2021-04-25 ENCOUNTER — Ambulatory Visit: Payer: 59 | Admitting: Family Medicine

## 2021-05-11 ENCOUNTER — Other Ambulatory Visit: Payer: Self-pay | Admitting: Family Medicine

## 2021-06-04 ENCOUNTER — Other Ambulatory Visit: Payer: Self-pay

## 2021-06-04 ENCOUNTER — Ambulatory Visit (INDEPENDENT_AMBULATORY_CARE_PROVIDER_SITE_OTHER)
Admission: RE | Admit: 2021-06-04 | Discharge: 2021-06-04 | Disposition: A | Discharge status: Home / Self Care | Payer: 59 | Source: Ambulatory Visit | Attending: Family Medicine | Admitting: Family Medicine

## 2021-06-04 DIAGNOSIS — J189 Pneumonia, unspecified organism: Secondary | ICD-10-CM | POA: Diagnosis not present

## 2021-06-08 ENCOUNTER — Other Ambulatory Visit: Payer: Self-pay | Admitting: Family Medicine

## 2021-07-03 ENCOUNTER — Ambulatory Visit: Payer: 59 | Admitting: Rheumatology

## 2021-07-03 ENCOUNTER — Ambulatory Visit: Payer: 59 | Admitting: Physician Assistant

## 2021-07-09 ENCOUNTER — Encounter: Payer: 59 | Admitting: Family Medicine

## 2021-07-29 ENCOUNTER — Ambulatory Visit: Payer: 59 | Admitting: Physician Assistant

## 2021-07-31 NOTE — Progress Notes (Signed)
? ?Office Visit Note ? ?Patient: Alexis Hicks             ?Date of Birth: 07-Apr-1960           ?MRN: [id number]             ?PCP: Wendie Agreste, MD ?Referring: Wendie Agreste, MD ?Visit Date: 08/13/2021 ?Occupation: '@GUAROCC'$ @ ? ?Subjective:  ?Routine follow-up ? ?History of Present Illness: Alexis Hicks is a 61 y.o. female with history of osteoporosis, osteoarthritis, DDD, and fibromyalgia.  Patient remains on Actonel 150 mg 1 tablet every 30 days for management of osteoporosis.  She is taking a calcium and vitamin D supplement daily.  Patient reports that she fell several weeks ago while at home but did not have any injury or fracture.  She has been going to physical therapy every 3 weeks to improve her balance.  She has been walking on a daily basis as well as stretching daily for exercise.  She is also started working with a trainer on a weekly basis lifting weights.  She continues to wear a brace on her right foot for foot drop at times when she knows she will be walking for prolonged distances.  She has occasional myalgias and muscle tenderness due to fibromyalgia but overall her symptoms have been tolerable.  She continues to experience intermittent pain in both CMC joints.  She wears CMC joint braces as needed and tries to avoid overuse activities which aggravate her hands. ? ? ?Activities of Daily Living:  ?Patient reports morning stiffness for a few minutes.   ?Patient Denies nocturnal pain.  ?Difficulty dressing/grooming: Denies ?Difficulty climbing stairs: Denies ?Difficulty getting out of chair: Denies ?Difficulty using hands for taps, buttons, cutlery, and/or writing: Reports ? ?Review of Systems  ?Constitutional:  Negative for fatigue.  ?HENT:  Positive for mouth sores and mouth dryness. Negative for nose dryness.   ?Eyes:  Positive for dryness. Negative for pain and itching.  ?Respiratory:  Negative for shortness of breath and difficulty breathing.   ?Cardiovascular:  Negative for  chest pain and palpitations.  ?Gastrointestinal:  Negative for blood in stool, constipation and diarrhea.  ?Endocrine: Negative for increased urination.  ?Genitourinary:  Negative for difficulty urinating.  ?Musculoskeletal:  Positive for joint pain, joint pain and muscle weakness. Negative for joint swelling, myalgias, morning stiffness, muscle tenderness and myalgias.  ?Skin:  Positive for color change and rash. Negative for redness.  ?Allergic/Immunologic: Negative for susceptible to infections.  ?Neurological:  Positive for numbness. Negative for dizziness, headaches, memory loss and weakness.  ?Hematological:  Negative for bruising/bleeding tendency.  ?Psychiatric/Behavioral:  Negative for confusion.   ? ?PMFS History:  ?Patient Active Problem List  ? Diagnosis Date Noted  ? DDD (degenerative disc disease), lumbar 01/09/2021  ? Right foot drop 01/09/2021  ? Other osteoporosis without current pathological fracture 01/09/2021  ? Osteopenia of multiple sites 08/25/2016  ? Vitamin D deficiency 08/25/2016  ? Fatigue 02/17/2016  ? Primary osteoarthritis of both feet 02/17/2016  ? DDD Cspine 02/17/2016  ? Osteoarthritis of lumbar spine 02/17/2016  ? Weakness of right lower extremity 02/17/2016  ? Migraine without status migrainosus, not intractable 02/17/2016  ? Raynaud's  02/17/2016  ? Sicca syndrome ([place]) 02/17/2016  ? DUB (dysfunctional uterine bleeding)   ? Fibromyalgia syndrome 09/10/2011  ? Primary osteoarthritis of both hands 09/10/2011  ? Scoliosis 09/10/2011  ?  ?Past Medical History:  ?Diagnosis Date  ? Chronically dry eyes, right   ? Dry mouth   ?  DUB (dysfunctional uterine bleeding)   ? Fibromyalgia   ? Osteoporosis 04/2018  ? Dr. Butler [place]  ? Pneumonia 04/2021  ? Raynaud's syndrome   ?  ?Family History  ?Problem Relation Age of Onset  ? Arthritis Mother   ? Osteoporosis Mother   ? Dementia Mother   ? Skin cancer Father   ? Heart disease Father   ? Metabolic syndrome Brother   ? Diabetes Brother   ?  Hypertension Brother   ? High Cholesterol Brother   ? Stroke Brother   ? Uterine cancer Maternal Aunt   ? Breast cancer Paternal Aunt   ? Depression Maternal Grandmother   ? Hypertension Maternal Grandmother   ? Stroke Maternal Grandmother   ? Kidney disease Maternal Grandfather   ? Breast cancer Paternal Grandmother   ? ?Past Surgical History:  ?Procedure Laterality Date  ? SALPINGECTOMY    ? ?Social History  ? ?Social History Narrative  ? Not on file  ? ?Immunization History  ?Administered Date(s) Administered  ? Influenza-Unspecified 01/11/2019, 01/16/2021  ? PFIZER(Purple Top)SARS-COV-2 Vaccination 06/16/2019, 07/10/2019, 03/09/2020, 08/14/2020  ? Zoster Recombinat (Shingrix) 10/24/2020, 01/02/2021  ?  ? ?Objective: ?Vital Signs: BP 112/73 (BP Location: Left Arm, Patient Position: Sitting, Cuff Size: Normal)   Pulse (!) 56   Ht '5\' 5"'$  (1.651 m)   Wt 154 lb (69.9 kg)   BMI 25.63 kg/m?   ? ?Physical Exam ?Vitals and nursing note reviewed.  ?Constitutional:   ?   Appearance: She is well-developed.  ?HENT:  ?   Head: Normocephalic and atraumatic.  ?Eyes:  ?   Conjunctiva/sclera: Conjunctivae normal.  ?Cardiovascular:  ?   Rate and Rhythm: Normal rate and regular rhythm.  ?   Heart sounds: Normal heart sounds.  ?Pulmonary:  ?   Effort: Pulmonary effort is normal.  ?   Breath sounds: Normal breath sounds.  ?Abdominal:  ?   General: Bowel sounds are normal.  ?   Palpations: Abdomen is soft.  ?Musculoskeletal:  ?   Cervical back: Normal range of motion.  ?Skin: ?   General: Skin is warm and dry.  ?   Capillary Refill: Capillary refill takes less than 2 seconds.  ?Neurological:  ?   Mental Status: She is alert and oriented to person, place, and time.  ?Psychiatric:     ?   Behavior: Behavior normal.  ?  ? ?Musculoskeletal Exam: C-spine has limited range of motion especially with lateral Tatian.  Mild thoracic kyphosis noted.  Some discomfort with lumbar range of motion.  Shoulder joints, elbow joints, wrist joints,  MCPs, PIPs, DIPs have good range of motion with no synovitis.  PIP and DIP thickening consistent with osteoarthritis of both hands.  Chase joint prominence bilaterally.  Tenderness palpation over the right CMC joint.  Complete fist formation bilaterally.  Hip joints have good range of motion with no groin pain.  Some tenderness to palpation over bilateral contact bursa.  Knee joints have good range of motion with no warmth or effusion.  Ankle joints have good range of motion with some tenderness over the right ankle. ? ?CDAI Exam: ?CDAI Score: -- ?Patient Global: --; Provider Global: -- ?Swollen: --; Tender: -- ?Joint Exam 08/13/2021  ? ?No joint exam has been documented for this visit  ? ?There is currently no information documented on the homunculus. Go to the Rheumatology activity and complete the homunculus joint exam. ? ?Investigation: ?No additional findings. ? ?Imaging: ?No results found. ? ?Recent Labs: ?No results found  for: WBC, HGB, PLT, NA, K, CL, CO2, GLUCOSE, BUN, CREATININE, BILITOT, ALKPHOS, AST, ALT, PROT, ALBUMIN, CALCIUM, GFRAA, QFTBGOLD, QFTBGOLDPLUS ? ?Speciality Comments: Bonivax 1 dose 03/21-SEs ?actonel 04/21 ? ?Procedures:  ?No procedures performed ?Allergies: Patient has no known allergies.  ? ?Assessment / Plan:     ?Visit Diagnoses: Other osteoporosis without current pathological fracture: DEXA updated on 08/21/2020: Left femoral neck BMD 0.551 with T score -2.7.  DEXA ordered by her gynecologist.  Patient remains on Actonel 150 mg tablets every month.  She was started on Actonel in April 2021.  She previously could not tolerate taking Boniva.  She continues to take a calcium and vitamin D supplement on a daily basis as recommended.  She had a fall several weeks ago in her home but did not have any injury or fracture.  She has been going to physical therapy every 3 weeks to improve her balance and has also been working with a Physiological scientist once a week.  She has been trying to increase her  weightlifting regimen and has been walking on a daily basis for exercise.  Discussed the importance of lower extremity muscle strengthening and fall prevention. ? ?Vitamin D deficiency: She is taking vitamin D 20

## 2021-08-13 ENCOUNTER — Encounter: Payer: Self-pay | Admitting: Physician Assistant

## 2021-08-13 ENCOUNTER — Ambulatory Visit: Payer: 59 | Admitting: Physician Assistant

## 2021-08-13 VITALS — BP 112/73 | HR 56 | Ht 65.0 in | Wt 154.0 lb

## 2021-08-13 DIAGNOSIS — E559 Vitamin D deficiency, unspecified: Secondary | ICD-10-CM | POA: Diagnosis not present

## 2021-08-13 DIAGNOSIS — M19041 Primary osteoarthritis, right hand: Secondary | ICD-10-CM

## 2021-08-13 DIAGNOSIS — M19072 Primary osteoarthritis, left ankle and foot: Secondary | ICD-10-CM

## 2021-08-13 DIAGNOSIS — R5383 Other fatigue: Secondary | ICD-10-CM

## 2021-08-13 DIAGNOSIS — Z8669 Personal history of other diseases of the nervous system and sense organs: Secondary | ICD-10-CM

## 2021-08-13 DIAGNOSIS — M21371 Foot drop, right foot: Secondary | ICD-10-CM

## 2021-08-13 DIAGNOSIS — M19042 Primary osteoarthritis, left hand: Secondary | ICD-10-CM

## 2021-08-13 DIAGNOSIS — M5030 Other cervical disc degeneration, unspecified cervical region: Secondary | ICD-10-CM | POA: Diagnosis not present

## 2021-08-13 DIAGNOSIS — M47816 Spondylosis without myelopathy or radiculopathy, lumbar region: Secondary | ICD-10-CM

## 2021-08-13 DIAGNOSIS — M4185 Other forms of scoliosis, thoracolumbar region: Secondary | ICD-10-CM | POA: Diagnosis not present

## 2021-08-13 DIAGNOSIS — M818 Other osteoporosis without current pathological fracture: Secondary | ICD-10-CM

## 2021-08-13 DIAGNOSIS — M797 Fibromyalgia: Secondary | ICD-10-CM

## 2021-08-13 DIAGNOSIS — M19071 Primary osteoarthritis, right ankle and foot: Secondary | ICD-10-CM

## 2021-08-13 DIAGNOSIS — M5136 Other intervertebral disc degeneration, lumbar region: Secondary | ICD-10-CM

## 2021-08-13 DIAGNOSIS — M51369 Other intervertebral disc degeneration, lumbar region without mention of lumbar back pain or lower extremity pain: Secondary | ICD-10-CM

## 2021-08-13 DIAGNOSIS — M3500 Sicca syndrome, unspecified: Secondary | ICD-10-CM

## 2021-09-18 ENCOUNTER — Other Ambulatory Visit: Payer: Self-pay | Admitting: Family Medicine

## 2021-09-18 ENCOUNTER — Ambulatory Visit (INDEPENDENT_AMBULATORY_CARE_PROVIDER_SITE_OTHER): Payer: 59 | Admitting: Family Medicine

## 2021-09-18 ENCOUNTER — Encounter: Payer: Self-pay | Admitting: Family Medicine

## 2021-09-18 VITALS — BP 112/60 | HR 66 | Temp 98.2°F | Resp 16 | Ht 65.0 in | Wt 153.2 lb

## 2021-09-18 DIAGNOSIS — Z23 Encounter for immunization: Secondary | ICD-10-CM

## 2021-09-18 DIAGNOSIS — Z13220 Encounter for screening for lipoid disorders: Secondary | ICD-10-CM

## 2021-09-18 DIAGNOSIS — Z0000 Encounter for general adult medical examination without abnormal findings: Secondary | ICD-10-CM | POA: Diagnosis not present

## 2021-09-18 DIAGNOSIS — Z131 Encounter for screening for diabetes mellitus: Secondary | ICD-10-CM | POA: Diagnosis not present

## 2021-09-18 LAB — LIPID PANEL
Cholesterol: 183 mg/dL (ref 0–200)
HDL: 63.2 mg/dL (ref >39.00)
LDL Cholesterol: 97 mg/dL (ref 0–99)
NonHDL: 119.58
Total CHOL/HDL Ratio: 3
Triglycerides: 113 mg/dL (ref 0.0–149.0)
VLDL: 22.6 mg/dL (ref 0.0–40.0)

## 2021-09-18 LAB — COMPREHENSIVE METABOLIC PANEL
ALT: 20 U/L (ref 0–35)
AST: 22 U/L (ref 0–37)
Albumin: 4.1 g/dL (ref 3.5–5.2)
Alkaline Phosphatase: 143 U/L — ABNORMAL HIGH (ref 39–117)
BUN: 11 mg/dL (ref 6–23)
CO2: 31 mEq/L (ref 19–32)
Calcium: 9.6 mg/dL (ref 8.4–10.5)
Chloride: 104 mEq/L (ref 96–112)
Creatinine, Ser: 0.82 mg/dL (ref 0.40–1.20)
GFR: 76.42 mL/min (ref >60.00)
Glucose, Bld: 89 mg/dL (ref 70–99)
Potassium: 4.1 mEq/L (ref 3.5–5.1)
Sodium: 141 mEq/L (ref 135–145)
Total Bilirubin: 0.6 mg/dL (ref 0.2–1.2)
Total Protein: 7.2 g/dL (ref 6.0–8.3)

## 2021-09-18 LAB — HEMOGLOBIN A1C: Hgb A1c MFr Bld: 6 % (ref 4.6–6.5)

## 2021-09-18 NOTE — Progress Notes (Signed)
Subjective:  Patient ID: Alexis Hicks, female    DOB: 07/31/1960  Age: 61 y.o. MRN: [id number]  CC:  Chief Complaint  Patient presents with   Annual Exam    Pt here for annual no concerns, is fasting for labs     HPI Alexis Hicks presents for Annual Exam Care team PCP, me Gynecology, Dr. Estanislado Pandy prior, [facility], treating for osteoporosis with Actonel, Calcium and vitamin D. Dermatology, Dr. Sharyn Lull with history of eczema. Triamcinolone cream, Aveeno, [place]. Rheumatology, Dr. Corliss Skains, fibromyalgia, Raynaud's, sicca syndrome. Ophthalmology, Dr. Santiago Bumpers, dry eye, treated with Restasis. Flonase prescribed previously for allergies - working ok, avoids oral antihistamines due to side effects, Option of Flovent if asthma symptoms.  No current inhalers, no recent wheeze..  On PT for sciatic issues, and neck issues     09/18/2021    9:13 AM 04/23/2021    1:50 PM 10/24/2020    1:15 PM 04/06/2019   10:27 AM 08/22/2018   10:06 AM  Depression screen PHQ 2/9  Decreased Interest 0 0 0 0 0  Down, Depressed, Hopeless 0 0 0 0 0  PHQ - 2 Score 0 0 0 0 0    Health Maintenance  Topic Date Due   TETANUS/TDAP  09/18/2021 (Originally 11/06/1977)   Hepatitis C Screening  09/18/2021 (Originally 11/06/1976)   HIV Screening  09/18/2021 (Originally 11/06/1973)   COVID-19 Vaccine (5 - Booster for Pfizer series) 10/04/2021 (Originally 10/09/2020)   INFLUENZA VACCINE  11/04/2021   PAP SMEAR-Modifier  06/23/2022   MAMMOGRAM  08/22/2022   COLONOSCOPY (Pts 45-1yrs Insurance coverage will need to be confirmed)  05/20/2030   Zoster Vaccines- Shingrix  Completed   HPV VACCINES  Aged Out  Colonoscopy 2022. [facility] - advised 38yr repeat.  mammogram Mammogram in May at GYN Pap test in past few years. Normal in 06/2019   Immunization History  Administered Date(s) Administered   Influenza-Unspecified 01/11/2019, 01/16/2021   PFIZER(Purple Top)SARS-COV-2  Vaccination 06/16/2019, 07/10/2019, 03/09/2020, 08/14/2020   Zoster Recombinat (Shingrix) 10/24/2020, 01/02/2021  Covid bivalent booster 01/30/21.  Plans on flu vaccine at work.  Tetanus - unknown, defers to nurse visit.   No results found. Optho - regular care, recent visit.   Dental:Yes and Within Last 6 months - every 4 months.   Alcohol: minimal - 0-2 per week.   Tobacco: none  Exercise: plans to decrease work to 30hrs so access to gym should increase.  Currently weight 1x/week, housework and walking daily - 1min,stretches. Other family and work responsibilities - hard to find time.    History Patient Active Problem List   Diagnosis Date Noted   DDD (degenerative disc disease), lumbar 01/09/2021   Right foot drop 01/09/2021   Other osteoporosis without current pathological fracture 01/09/2021   Osteopenia of multiple sites 08/25/2016   Vitamin D deficiency 08/25/2016   Fatigue 02/17/2016   Primary osteoarthritis of both feet 02/17/2016   DDD Cspine 02/17/2016   Osteoarthritis of lumbar spine 02/17/2016   Weakness of right lower extremity 02/17/2016   Migraine without status migrainosus, not intractable 02/17/2016   Raynaud's  02/17/2016   Sicca syndrome (HCC) 02/17/2016   DUB (dysfunctional uterine bleeding)    Fibromyalgia syndrome 09/10/2011   Primary osteoarthritis of both hands 09/10/2011   Scoliosis 09/10/2011   Past Medical History:  Diagnosis Date   Chronically dry eyes, right    Dry mouth    DUB (dysfunctional uterine bleeding)    Fibromyalgia    Osteoporosis  04/2018   Dr. Butler [place]   Pneumonia 04/2021   Raynaud's syndrome    Past Surgical History:  Procedure Laterality Date   SALPINGECTOMY     No Known Allergies Prior to Admission medications   Medication Sig Start Date End Date Taking? Authorizing Provider  ascorbic Acid (VITAMIN C) 500 MG CPCR as needed.   Yes [provider]  Biotin 1 MG CAPS Take by mouth.   Yes [provider]  Calcium Carbonate-Vitamin D (CALCIUM 600+D HIGH POTENCY PO) calcium   Yes [provider]  cholecalciferol (VITAMIN D) 1000 units tablet Take 2,000 Units by mouth daily.    Yes [provider]  Cyanocobalamin (VITAMIN B 12 PO) Take by mouth.   Yes [provider]  cycloSPORINE (RESTASIS) 0.05 % ophthalmic emulsion 1 drop daily.   Yes [provider]  fluticasone (FLONASE) 50 MCG/ACT nasal spray SPRAY 2 SPRAYS INTO EACH NOSTRIL EVERY DAY 05/14/21  Yes Wendie Agreste, MD  Omega-3 Fatty Acids (FISH OIL PO) Take by mouth daily.   Yes [provider]  risedronate (ACTONEL) 150 MG tablet Take 150 mg by mouth every 30 (thirty) days. 01/11/20  Yes [provider]  triamcinolone cream (KENALOG) 0.1 % triamcinolone acetonide 0.1 % topical cream  APPLY TO SKIN TWICE DAILY X 14 DAYS   Yes [provider]  TURMERIC PO Take by mouth daily.   Yes [provider]  Zinc 10 MG LOZG Use as directed in the mouth or throat as needed.   Yes [provider]   Social History   Socioeconomic History   Marital status: Married    Spouse name: Not on file   Number of children: Not on file   Years of education: Not on file   Highest education level: Not on file  Occupational History   Not on file  Tobacco Use   Smoking status: Never    Passive exposure: Past   Smokeless tobacco: Never  Vaping Use   Vaping Use: Never used  Substance and Sexual Activity   Alcohol use: Yes    Comment: occ   Drug use: No   Sexual activity: Not Currently  Other Topics Concern   Not on file  Social History Narrative   Not on file   Social Determinants of Health   Financial Resource Strain: Not on file  Food Insecurity: Not on file  Transportation Needs: Not on file  Physical Activity: Not on file  Stress: Not on file  Social Connections: Not on file  Intimate Partner Violence: Not on file    Review of Systems 13 point review of  systems per patient health survey noted.  Negative other than as indicated above or in HPI.    Objective:   Vitals:   09/18/21 0908  BP: 112/60  Pulse: 66  Resp: 16  Temp: 98.2 F (36.8 C)  TempSrc: Temporal  SpO2: 94%  Weight: 153 lb 3.2 oz (69.5 kg)  Height: $Remove'5\' 5"'DjdlfIQ$  (1.651 m)     Physical Exam Constitutional:      Appearance: She is well-developed.  HENT:     Head: Normocephalic and atraumatic.     Right Ear: External ear normal.     Left Ear: External ear normal.  Eyes:     Conjunctiva/sclera: Conjunctivae normal.     Pupils: Pupils are equal, round, and reactive to light.  Neck:     Thyroid: No thyromegaly.  Cardiovascular:     Rate and Rhythm: Normal  rate and regular rhythm.     Heart sounds: Normal heart sounds. No murmur heard. Pulmonary:     Effort: Pulmonary effort is normal. No respiratory distress.     Breath sounds: Normal breath sounds. No wheezing.  Abdominal:     General: Bowel sounds are normal.     Palpations: Abdomen is soft.     Tenderness: There is no abdominal tenderness.  Musculoskeletal:        General: No tenderness. Normal range of motion.     Cervical back: Normal range of motion and neck supple.  Lymphadenopathy:     Cervical: No cervical adenopathy.  Skin:    General: Skin is warm and dry.     Findings: No rash.  Neurological:     Mental Status: She is alert and oriented to person, place, and time.  Psychiatric:        Behavior: Behavior normal.        Thought Content: Thought content normal.        Assessment & Plan:  Alexis Hicks is a 61 y.o. female . Annual physical exam - Plan: Comprehensive metabolic panel, Lipid panel, Hemoglobin A1c  - -anticipatory guidance as below in AVS, screening labs above. Health maintenance items as above in HPI discussed/recommended as applicable.   Screening for diabetes mellitus - Plan: Comprehensive metabolic panel, Hemoglobin A1c  Screening for hyperlipidemia - Plan: Comprehensive  metabolic panel, Lipid panel  Need for diphtheria-tetanus-pertussis (Tdap) vaccine - Plan: Tdap vaccine greater than or equal to 7yo IM   No orders of the defined types were placed in this encounter.  Patient Instructions  Tdap vaccine at nurse visit.  If any concerns on labs I will let you know.  Ideal goal exercise is 119min per week spread over most days.  Take care!  Preventive Care 57-45 Years Old, Female Preventive care refers to lifestyle choices and visits with your health care provider that can promote health and wellness. Preventive care visits are also called wellness exams. What can I expect for my preventive care visit? Counseling Your health care provider may ask you questions about your: Medical history, including: Past medical problems. Family medical history. Pregnancy history. Current health, including: Menstrual cycle. Method of birth control. Emotional well-being. Home life and relationship well-being. Sexual activity and sexual health. Lifestyle, including: Alcohol, nicotine or tobacco, and drug use. Access to firearms. Diet, exercise, and sleep habits. Work and work Statistician. Sunscreen use. Safety issues such as seatbelt and bike helmet use. Physical exam Your health care provider will check your: Height and weight. These may be used to calculate your BMI (body mass index). BMI is a measurement that tells if you are at a healthy weight. Waist circumference. This measures the distance around your waistline. This measurement also tells if you are at a healthy weight and may help predict your risk of certain diseases, such as type 2 diabetes and high blood pressure. Heart rate and blood pressure. Body temperature. Skin for abnormal spots. What immunizations do I need?  Vaccines are usually given at various ages, according to a schedule. Your health care provider will recommend vaccines for you based on your age, medical history, and lifestyle or other  factors, such as travel or where you work. What tests do I need? Screening Your health care provider may recommend screening tests for certain conditions. This may include: Lipid and cholesterol levels. Diabetes screening. This is done by checking your blood sugar (glucose) after you have not eaten for a  while (fasting). Pelvic exam and Pap test. Hepatitis B test. Hepatitis C test. HIV (human immunodeficiency virus) test. STI (sexually transmitted infection) testing, if you are at risk. Lung cancer screening. Colorectal cancer screening. Mammogram. Talk with your health care provider about when you should start having regular mammograms. This may depend on whether you have a family history of breast cancer. BRCA-related cancer screening. This may be done if you have a family history of breast, ovarian, tubal, or peritoneal cancers. Bone density scan. This is done to screen for osteoporosis. Talk with your health care provider about your test results, treatment options, and if necessary, the need for more tests. Follow these instructions at home: Eating and drinking  Eat a diet that includes fresh fruits and vegetables, whole grains, lean protein, and low-fat dairy products. Take vitamin and mineral supplements as recommended by your health care provider. Do not drink alcohol if: Your health care provider tells you not to drink. You are pregnant, may be pregnant, or are planning to become pregnant. If you drink alcohol: Limit how much you have to 0-1 drink a day. Know how much alcohol is in your drink. In the [place] one drink equals one 12 oz bottle of beer (355 mL), one 5 oz glass of wine (148 mL), or one 1 oz glass of hard liquor (44 mL). Lifestyle Brush your teeth every morning and night with fluoride toothpaste. Floss one time each day. Exercise for at least 30 minutes 5 or more days each week. Do not use any products that contain nicotine or tobacco. These products include  cigarettes, chewing tobacco, and vaping devices, such as e-cigarettes. If you need help quitting, ask your health care provider. Do not use drugs. If you are sexually active, practice safe sex. Use a condom or other form of protection to prevent STIs. If you do not wish to become pregnant, use a form of birth control. If you plan to become pregnant, see your health care provider for a prepregnancy visit. Take aspirin only as told by your health care provider. Make sure that you understand how much to take and what form to take. Work with your health care provider to find out whether it is safe and beneficial for you to take aspirin daily. Find healthy ways to manage stress, such as: Meditation, yoga, or listening to music. Journaling. Talking to a trusted person. Spending time with friends and family. Minimize exposure to UV radiation to reduce your risk of skin cancer. Safety Always wear your seat belt while driving or riding in a vehicle. Do not drive: If you have been drinking alcohol. Do not ride with someone who has been drinking. When you are tired or distracted. While texting. If you have been using any mind-altering substances or drugs. Wear a helmet and other protective equipment during sports activities. If you have firearms in your house, make sure you follow all gun safety procedures. Seek help if you have been physically or sexually abused. What's next? Visit your health care provider once a year for an annual wellness visit. Ask your health care provider how often you should have your eyes and teeth checked. Stay up to date on all vaccines. This information is not intended to replace advice given to you by your health care provider. Make sure you discuss any questions you have with your health care provider. Document Revised: 09/18/2020 Document Reviewed: 09/18/2020 Elsevier Patient Education  [place].     Signed,   Merri Ray, MD [facility],  [facility] 09/18/21 9:28 AM

## 2021-09-18 NOTE — Patient Instructions (Addendum)
Tdap vaccine at nurse visit.  If any concerns on labs I will let you know.  Ideal goal exercise is 137min per week spread over most days.  Take care!  Preventive Care 26-63 Years Old, Female Preventive care refers to lifestyle choices and visits with your health care provider that can promote health and wellness. Preventive care visits are also called wellness exams. What can I expect for my preventive care visit? Counseling Your health care provider may ask you questions about your: Medical history, including: Past medical problems. Family medical history. Pregnancy history. Current health, including: Menstrual cycle. Method of birth control. Emotional well-being. Home life and relationship well-being. Sexual activity and sexual health. Lifestyle, including: Alcohol, nicotine or tobacco, and drug use. Access to firearms. Diet, exercise, and sleep habits. Work and work Statistician. Sunscreen use. Safety issues such as seatbelt and bike helmet use. Physical exam Your health care provider will check your: Height and weight. These may be used to calculate your BMI (body mass index). BMI is a measurement that tells if you are at a healthy weight. Waist circumference. This measures the distance around your waistline. This measurement also tells if you are at a healthy weight and may help predict your risk of certain diseases, such as type 2 diabetes and high blood pressure. Heart rate and blood pressure. Body temperature. Skin for abnormal spots. What immunizations do I need?  Vaccines are usually given at various ages, according to a schedule. Your health care provider will recommend vaccines for you based on your age, medical history, and lifestyle or other factors, such as travel or where you work. What tests do I need? Screening Your health care provider may recommend screening tests for certain conditions. This may include: Lipid and cholesterol levels. Diabetes screening.  This is done by checking your blood sugar (glucose) after you have not eaten for a while (fasting). Pelvic exam and Pap test. Hepatitis B test. Hepatitis C test. HIV (human immunodeficiency virus) test. STI (sexually transmitted infection) testing, if you are at risk. Lung cancer screening. Colorectal cancer screening. Mammogram. Talk with your health care provider about when you should start having regular mammograms. This may depend on whether you have a family history of breast cancer. BRCA-related cancer screening. This may be done if you have a family history of breast, ovarian, tubal, or peritoneal cancers. Bone density scan. This is done to screen for osteoporosis. Talk with your health care provider about your test results, treatment options, and if necessary, the need for more tests. Follow these instructions at home: Eating and drinking  Eat a diet that includes fresh fruits and vegetables, whole grains, lean protein, and low-fat dairy products. Take vitamin and mineral supplements as recommended by your health care provider. Do not drink alcohol if: Your health care provider tells you not to drink. You are pregnant, may be pregnant, or are planning to become pregnant. If you drink alcohol: Limit how much you have to 0-1 drink a day. Know how much alcohol is in your drink. In the [place] one drink equals one 12 oz bottle of beer (355 mL), one 5 oz glass of wine (148 mL), or one 1 oz glass of hard liquor (44 mL). Lifestyle Brush your teeth every morning and night with fluoride toothpaste. Floss one time each day. Exercise for at least 30 minutes 5 or more days each week. Do not use any products that contain nicotine or tobacco. These products include cigarettes, chewing tobacco, and vaping devices, such as  e-cigarettes. If you need help quitting, ask your health care provider. Do not use drugs. If you are sexually active, practice safe sex. Use a condom or other form of protection  to prevent STIs. If you do not wish to become pregnant, use a form of birth control. If you plan to become pregnant, see your health care provider for a prepregnancy visit. Take aspirin only as told by your health care provider. Make sure that you understand how much to take and what form to take. Work with your health care provider to find out whether it is safe and beneficial for you to take aspirin daily. Find healthy ways to manage stress, such as: Meditation, yoga, or listening to music. Journaling. Talking to a trusted person. Spending time with friends and family. Minimize exposure to UV radiation to reduce your risk of skin cancer. Safety Always wear your seat belt while driving or riding in a vehicle. Do not drive: If you have been drinking alcohol. Do not ride with someone who has been drinking. When you are tired or distracted. While texting. If you have been using any mind-altering substances or drugs. Wear a helmet and other protective equipment during sports activities. If you have firearms in your house, make sure you follow all gun safety procedures. Seek help if you have been physically or sexually abused. What's next? Visit your health care provider once a year for an annual wellness visit. Ask your health care provider how often you should have your eyes and teeth checked. Stay up to date on all vaccines. This information is not intended to replace advice given to you by your health care provider. Make sure you discuss any questions you have with your health care provider. Document Revised: 09/18/2020 Document Reviewed: 09/18/2020 Elsevier Patient Education  [place].

## 2021-09-23 ENCOUNTER — Ambulatory Visit: Payer: 59 | Admitting: Plastic Surgery

## 2021-09-23 ENCOUNTER — Encounter: Payer: Self-pay | Admitting: Plastic Surgery

## 2021-09-23 DIAGNOSIS — D229 Melanocytic nevi, unspecified: Secondary | ICD-10-CM | POA: Diagnosis not present

## 2021-09-23 DIAGNOSIS — Z840 Family history of diseases of the skin and subcutaneous tissue: Secondary | ICD-10-CM | POA: Diagnosis not present

## 2021-09-23 NOTE — Progress Notes (Signed)
Patient ID: Alexis Hicks, female    DOB: November 29, 1960, 61 y.o.   MRN: [id number]   Chief Complaint  Patient presents with   Consult   Skin Problem    The patient is a 61-year-old female.  She has a benign mole on her right cheek.  It is at the nasolabial fold and is about 7 mm in size.  She sees the dermatologist and the dermatologist is not concerned with the area and said that she does not believe that it is malignant in any way.  She was sent to [place] for excision.  Patient would like to have it removed.  She does have a family history of skin cancer but no melanoma.    Review of Systems  Constitutional: Negative.   Eyes: Negative.   Respiratory: Negative.    Cardiovascular: Negative.   Gastrointestinal: Negative.   Endocrine: Negative.   Genitourinary: Negative.   Musculoskeletal: Negative.   Skin: Negative.   Hematological: Negative.   Psychiatric/Behavioral: Negative.      Past Medical History:  Diagnosis Date   Chronically dry eyes, right    Dry mouth    DUB (dysfunctional uterine bleeding)    Fibromyalgia    Osteoporosis 04/2018   Dr. Butler [place]   Pneumonia 04/2021   Raynaud's syndrome     Past Surgical History:  Procedure Laterality Date   SALPINGECTOMY        Current Outpatient Medications:    ascorbic Acid (VITAMIN C) 500 MG CPCR, as needed., Disp: , Rfl:    Biotin 1 MG CAPS, Take by mouth., Disp: , Rfl:    Calcium Carbonate-Vitamin D (CALCIUM 600+D HIGH POTENCY PO), calcium, Disp: , Rfl:    cholecalciferol (VITAMIN D) 1000 units tablet, Take 2,000 Units by mouth daily. , Disp: , Rfl:    Cyanocobalamin (VITAMIN B 12 PO), Take by mouth., Disp: , Rfl:    cycloSPORINE (RESTASIS) 0.05 % ophthalmic emulsion, 1 drop daily., Disp: , Rfl:    fluticasone (FLONASE) 50 MCG/ACT nasal spray, SPRAY 2 SPRAYS INTO EACH NOSTRIL EVERY DAY, Disp: 16 mL, Rfl: 1   Omega-3 Fatty Acids (FISH OIL PO), Take by mouth daily., Disp: , Rfl:    risedronate (ACTONEL) 150 MG tablet,  Take 150 mg by mouth every 30 (thirty) days., Disp: , Rfl:    triamcinolone cream (KENALOG) 0.1 %, triamcinolone acetonide 0.1 % topical cream  APPLY TO SKIN TWICE DAILY X 14 DAYS, Disp: , Rfl:    TURMERIC PO, Take by mouth daily., Disp: , Rfl:    Zinc 10 MG LOZG, Use as directed in the mouth or throat as needed., Disp: , Rfl:    Objective:   Vitals:   09/23/21 1433  BP: 111/74  Pulse: 60  SpO2: 94%    Physical Exam Vitals reviewed.  Constitutional:      Appearance: Normal appearance.  HENT:     Head: Normocephalic and atraumatic.   Cardiovascular:     Rate and Rhythm: Normal rate.  Skin:    Capillary Refill: Capillary refill takes less than 2 seconds.  Neurological:     Mental Status: She is alert and oriented to person, place, and time.  Psychiatric:        Mood and Affect: Mood normal.        Behavior: Behavior normal.        Thought Content: Thought content normal.     Assessment & Plan:  Benign mole  The patient is a good candidate for  excision of benign right cheek mole.  She understands that there will be a scar.  We will make arrangements to have this excised here in the office.  She knows that this will be fee-for-service.  Pictures were obtained of the patient and placed in the chart with the patient's or guardian's permission.   Hodgeman, DO

## 2021-09-26 ENCOUNTER — Other Ambulatory Visit: Payer: Self-pay | Admitting: Family Medicine

## 2021-10-30 ENCOUNTER — Ambulatory Visit (INDEPENDENT_AMBULATORY_CARE_PROVIDER_SITE_OTHER): Payer: 59 | Admitting: Family Medicine

## 2021-10-30 DIAGNOSIS — Z23 Encounter for immunization: Secondary | ICD-10-CM

## 2021-10-30 NOTE — Progress Notes (Signed)
Pt received her TDAP vaccine . Tolerated injection well and gave counseling  about vaccine

## 2021-11-08 ENCOUNTER — Emergency Department (HOSPITAL_BASED_OUTPATIENT_CLINIC_OR_DEPARTMENT_OTHER)
Admission: EM | Admit: 2021-11-08 | Discharge: 2021-11-08 | Disposition: A | Discharge status: Home / Self Care | Payer: 59 | Attending: Emergency Medicine | Admitting: Emergency Medicine

## 2021-11-08 ENCOUNTER — Encounter (HOSPITAL_BASED_OUTPATIENT_CLINIC_OR_DEPARTMENT_OTHER): Payer: Self-pay | Admitting: Emergency Medicine

## 2021-11-08 ENCOUNTER — Other Ambulatory Visit: Payer: Self-pay

## 2021-11-08 DIAGNOSIS — R059 Cough, unspecified: Secondary | ICD-10-CM | POA: Diagnosis present

## 2021-11-08 DIAGNOSIS — J069 Acute upper respiratory infection, unspecified: Secondary | ICD-10-CM | POA: Diagnosis not present

## 2021-11-08 DIAGNOSIS — Z20822 Contact with and (suspected) exposure to covid-19: Secondary | ICD-10-CM | POA: Diagnosis not present

## 2021-11-08 LAB — SARS CORONAVIRUS 2 BY RT PCR: SARS Coronavirus 2 by RT PCR: NEGATIVE

## 2021-11-08 MED ORDER — NIRMATRELVIR/RITONAVIR (PAXLOVID)TABLET: 3.0000 | ORAL_TABLET | Freq: Two times a day (BID) | ORAL | 0 refills | Status: AC

## 2021-11-08 NOTE — ED Provider Notes (Signed)
[facility] EMERGENCY DEPARTMENT Provider Note   CSN: [id number] Arrival date & time: 11/08/21  1047     History  Chief Complaint  Patient presents with   Cough    Alexis Hicks is a 61 y.o. female with a past medical history of fibromyalgia and Raynaud's presenting today with upper respiratory symptoms.  Reports that since Thursday she has had some fatigue, rhinorrhea, postnasal drip, sore throat and headaches.  She took a COVID test on Thursday which was negative.  She thought it was allergies.  Her husband began to show signs of URI yesterday.  He took a COVID test that was positive.  She is here for reevaluation and requesting a COVID test.   Cough Associated symptoms: headaches, rhinorrhea and sore throat   Associated symptoms: no shortness of breath        Home Medications Prior to Admission medications   Medication Sig Start Date End Date Taking? Authorizing Provider  ascorbic Acid (VITAMIN C) 500 MG CPCR as needed.    [provider]  Biotin 1 MG CAPS Take by mouth.    [provider]  Calcium Carbonate-Vitamin D (CALCIUM 600+D HIGH POTENCY PO) calcium    [provider]  cholecalciferol (VITAMIN D) 1000 units tablet Take 2,000 Units by mouth daily.     [provider]  Cyanocobalamin (VITAMIN B 12 PO) Take by mouth.    [provider]  cycloSPORINE (RESTASIS) 0.05 % ophthalmic emulsion 1 drop daily.    [provider]  fluticasone (FLONASE) 50 MCG/ACT nasal spray SPRAY 2 SPRAYS INTO EACH NOSTRIL EVERY DAY 09/18/21   Wendie Agreste, MD  Omega-3 Fatty Acids (FISH OIL PO) Take by mouth daily.    [provider]  risedronate (ACTONEL) 150 MG tablet Take 150 mg by mouth every 30 (thirty) days. 01/11/20   [provider]  triamcinolone cream (KENALOG) 0.1 % triamcinolone acetonide 0.1 % topical cream  APPLY TO SKIN TWICE DAILY X 14 DAYS    [provider]  TURMERIC PO Take by  mouth daily.    [provider]  Zinc 10 MG LOZG Use as directed in the mouth or throat as needed.    [provider]      Allergies    Patient has no known allergies.    Review of Systems   Review of Systems  Constitutional:  Positive for fatigue.  HENT:  Positive for postnasal drip, rhinorrhea and sore throat.   Respiratory:  Positive for cough. Negative for shortness of breath.   Gastrointestinal:  Negative for diarrhea, nausea and vomiting.  Neurological:  Positive for headaches.    Physical Exam Updated Vital Signs BP 132/73   Pulse 65   Temp 98.7 F (37.1 C) (Oral)   Resp 18   Ht '5\' 5"'$  (1.651 m)   Wt 68 kg   SpO2 97%   BMI 24.96 kg/m  Physical Exam Vitals and nursing note reviewed.  Constitutional:      General: She is not in acute distress.    Appearance: Normal appearance. She is not ill-appearing.  HENT:     Head: Normocephalic and atraumatic.     Right Ear: Tympanic membrane normal.     Left Ear: Tympanic membrane normal.     Mouth/Throat:     Mouth: Mucous membranes are moist.     Pharynx: Oropharynx is clear. No oropharyngeal exudate or posterior oropharyngeal erythema.  Eyes:     General: No scleral icterus.  Conjunctiva/sclera: Conjunctivae normal.  Cardiovascular:     Rate and Rhythm: Normal rate and regular rhythm.  Pulmonary:     Effort: Pulmonary effort is normal. No respiratory distress.     Breath sounds: No wheezing or rales.  Abdominal:     General: Abdomen is flat.     Palpations: Abdomen is soft.  Skin:    General: Skin is warm and dry.     Findings: No rash.  Neurological:     Mental Status: She is alert.  Psychiatric:        Mood and Affect: Mood normal.        Behavior: Behavior normal.     ED Results / Procedures / Treatments   Labs (all labs ordered are listed, but only abnormal results are displayed) Labs Reviewed  SARS CORONAVIRUS 2 BY RT PCR    EKG None  Radiology No results  found.  Procedures Procedures   Medications Ordered in ED Medications - No data to display  ED Course/ Medical Decision Making/ A&P                           Medical Decision Making  61 year old female presenting with her husband due to COVID symptoms.  He is already tested positive and she is requesting a test.  COVID test pending.  Likely will be positive.  Regardless I suspect COVID-19 infection.  Will send her home with a written prescription for Paxlovid.  She has been instructed to use this if she test positive.  We discussed over-the-counter treatment for her symptoms and PCP follow-up.  She voiced understanding.  Normal work of breathing, no signs of cardiopulmonary demise.  Stable for discharge home.   Final Clinical Impression(s) / ED Diagnoses Final diagnoses:  Viral URI with cough    Rx / DC Orders ED Discharge Orders          Ordered    nirmatrelvir/ritonavir EUA (PAXLOVID) 20 x 150 MG & 10 x '100MG'$  TABS  2 times daily        11/08/21 1112           Results and diagnoses were explained to the patient. Return precautions discussed in full. Patient had no additional questions and expressed complete understanding.   This chart was dictated using voice recognition software.  Despite best efforts to proofread,  errors can occur which can change the documentation meaning.    Darliss Ridgel 11/08/21 1116    Hayden Rasmussen, MD 11/08/21 Johnnye Lana

## 2021-11-08 NOTE — Discharge Instructions (Addendum)
I will call you with the results of your COVID test.  Regardless, your prescription for the antiviral is attached to these papers.  Return with any severe chest pain, shortness of breath or fevers you are unable to treat at home.  Tylenol and ibuprofen are good options for your fever.  Any cold and flu medications over the counter are reasonable.  If you become congested, Mucinex is also a good option.  It was a pleasure to meet you and I hope that you feel better!

## 2021-11-08 NOTE — ED Triage Notes (Signed)
Pt arrives pov, steady gait, c/o HA, cough, sore throat, fatigue, and neck tenderness since yesterday afternoon. Requests covid test, spouse home covid test positive.

## 2021-12-02 ENCOUNTER — Ambulatory Visit: Payer: 59 | Admitting: Plastic Surgery

## 2021-12-02 ENCOUNTER — Ambulatory Visit (INDEPENDENT_AMBULATORY_CARE_PROVIDER_SITE_OTHER): Payer: Self-pay | Admitting: Plastic Surgery

## 2021-12-02 ENCOUNTER — Encounter: Payer: Self-pay | Admitting: Plastic Surgery

## 2021-12-02 DIAGNOSIS — L989 Disorder of the skin and subcutaneous tissue, unspecified: Secondary | ICD-10-CM

## 2021-12-02 HISTORY — PX: MOLE REMOVAL: SHX2046

## 2021-12-02 NOTE — Progress Notes (Signed)
Procedure Note  Preoperative Dx: right cheek mole  Postoperative Dx: Same  Procedure: excision of right cheek mole 8 mm   Anesthesia: Lidocaine 1% with 1:100,000 epinephrine  Description of Procedure: Risks and complications were explained to the patient.  Consent was confirmed and the patient understands the risks and benefits.  The potential complications and alternatives were explained and the patient consents.  The patient expressed understanding the option of not having the procedure and the risks of a scar.  Time out was called and all information was confirmed to be correct.    The area was prepped and drapped.  Lidocaine 1% with epinepherine was injected in the subcutaneous area.  After waiting several minutes for the local to take affect a #15 blade was used to excise the area in an eliptical pattern.  The skin edges were reapproximated with 6-0 Monocryl.  A dressing was applied.  The patient was given instructions on how to care for the area and a follow up appointment.  Alexis Hicks tolerated the procedure well and there were no complications. Agreed for no path evaluation.

## 2021-12-12 ENCOUNTER — Ambulatory Visit (INDEPENDENT_AMBULATORY_CARE_PROVIDER_SITE_OTHER): Payer: Self-pay | Admitting: Student

## 2021-12-12 DIAGNOSIS — L989 Disorder of the skin and subcutaneous tissue, unspecified: Secondary | ICD-10-CM

## 2021-12-12 NOTE — Progress Notes (Signed)
Patient is a 61 year old female with history of a mole on her right cheek.  Patient underwent excision of the right cheek mole on 12/02/2021 with Dr. Marla Roe.  The skin edges were closed with 6-0 Monocryl.  No pathology was sent for evaluation.  Patient presents to the clinic today for postprocedural follow-up.  Today, patient reports she is doing well.  She denies any issues with the procedure site.  She states that she has left the Steri-Strip on it in place.  She denies any redness, swelling or drainage.  She denies any fevers or chills.  On exam, patient is sitting upright in no acute distress.  The Steri-Strip was removed from her right cheek.  The incision appears to be intact and healing well.  There is no surrounding erythema, swelling or drainage.  There are several sutures noted along the incision.  These were cut and removed.  Patient tolerated well.  I discussed with the patient that she can apply Vaseline to the incision for about a week.  I told her after a week, she can start using scar cream such as Skinuva or Silagen.  I also discussed the option of Mederma with her.  I recommended the patient avoid direct sunlight to the incision site as this can worsen scar.  Patient expressed understanding.  All of the patient's questions were answered to her satisfaction.  Patient to follow-up as needed.  Instructed the patient to call if she has any questions or concerns.

## 2021-12-16 ENCOUNTER — Ambulatory Visit: Payer: 59 | Admitting: Surgical

## 2022-01-20 ENCOUNTER — Telehealth: Payer: Self-pay | Admitting: Rheumatology

## 2022-01-20 DIAGNOSIS — M21371 Foot drop, right foot: Secondary | ICD-10-CM

## 2022-01-20 DIAGNOSIS — M19071 Primary osteoarthritis, right ankle and foot: Secondary | ICD-10-CM

## 2022-01-20 NOTE — Telephone Encounter (Signed)
Patient called requesting a new PT referral to [facility] for her right foot and balance.  Patient states she is currently seeing him for her neck.

## 2022-01-20 NOTE — Telephone Encounter (Signed)
Ok to place new order.

## 2022-01-20 NOTE — Telephone Encounter (Signed)
Referral placed.

## 2022-02-05 NOTE — Progress Notes (Signed)
Office Visit Note  Patient: Alexis Hicks             Date of Birth: Jan 20, 1961           MRN: [id number]             PCP: Wendie Agreste, MD Referring: Wendie Agreste, MD Visit Date: 02/19/2022 Occupation: '@GUAROCC'$ @  Subjective:  Dry mouth and dry eyes  History of Present Illness: Alexis Hicks is a 61 y.o. female history of sicca symptoms, osteoarthritis, degenerative disc disease and fibromyalgia.  She states she is continues to have dry mouth and dry eyes.  She is states her dry eye symptoms are getting worse.  She has been using Restasis.  She has been in the moving process and has been experiencing increased stiffness in her joints.  She has been going to physical therapy for balance training which has been helpful.  She also was Ro a chiropractor for neck and lower back pain which has been helpful.  She continues to have some generalized pain and discomfort from fibromyalgia.  She has not noticed any joint swelling.  She has been wearing the AFO brace for her right foot drop which helps.  She also wears arthritis gloves and CMC braces.  She has been under a lot of stress.  Activities of Daily Living:  Patient reports morning stiffness for a few minutes.   Patient Denies nocturnal pain.  Difficulty dressing/grooming: Denies Difficulty climbing stairs: Denies Difficulty getting out of chair: Denies Difficulty using hands for taps, buttons, cutlery, and/or writing: Reports  Review of Systems  Constitutional:  Positive for fatigue.  HENT:  Positive for mouth dryness. Negative for mouth sores.   Eyes:  Positive for dryness.  Respiratory:  Negative for shortness of breath.   Cardiovascular:  Negative for chest pain and palpitations.  Gastrointestinal:  Negative for blood in stool, constipation and diarrhea.  Endocrine: Negative for increased urination.  Genitourinary:  Negative for involuntary urination.  Musculoskeletal:  Positive for myalgias, muscle weakness,  morning stiffness, muscle tenderness and myalgias. Negative for joint pain, gait problem, joint pain and joint swelling.  Skin:  Positive for sensitivity to sunlight. Negative for color change, rash and hair loss.  Allergic/Immunologic: Negative for susceptible to infections.  Neurological:  Positive for numbness. Negative for dizziness and headaches.  Hematological:  Negative for swollen glands.  Psychiatric/Behavioral:  Negative for depressed mood and sleep disturbance. The patient is not nervous/anxious.     PMFS History:  Patient Active Problem List   Diagnosis Date Noted   Skin lesion 12/02/2021   Benign mole 09/23/2021   DDD (degenerative disc disease), lumbar 01/09/2021   Right foot drop 01/09/2021   Other osteoporosis without current pathological fracture 01/09/2021   Osteopenia of multiple sites 08/25/2016   Vitamin D deficiency 08/25/2016   Fatigue 02/17/2016   Primary osteoarthritis of both feet 02/17/2016   DDD Cspine 02/17/2016   Osteoarthritis of lumbar spine 02/17/2016   Weakness of right lower extremity 02/17/2016   Migraine without status migrainosus, not intractable 02/17/2016   Raynaud's  02/17/2016   Sicca syndrome ([place]) 02/17/2016   DUB (dysfunctional uterine bleeding)    Fibromyalgia syndrome 09/10/2011   Primary osteoarthritis of both hands 09/10/2011   Scoliosis 09/10/2011    Past Medical History:  Diagnosis Date   Chronically dry eyes, right    Dry mouth    DUB (dysfunctional uterine bleeding)    Fibromyalgia    Osteoporosis 04/2018   Dr. Butler [place]  Pneumonia 04/2021   Raynaud's syndrome     Family History  Problem Relation Age of Onset   Arthritis Mother    Osteoporosis Mother    Dementia Mother    Skin cancer Father    Heart disease Father    Metabolic syndrome Brother    Diabetes Brother    Hypertension Brother    High Cholesterol Brother    Stroke Brother    Uterine cancer Maternal Aunt    Breast cancer Paternal Aunt    Depression  Maternal Grandmother    Hypertension Maternal Grandmother    Stroke Maternal Grandmother    Kidney disease Maternal Grandfather    Breast cancer Paternal Grandmother    Past Surgical History:  Procedure Laterality Date   MOLE REMOVAL  12/02/2021   face   SALPINGECTOMY     Social History   Social History Narrative   Not on file   Immunization History  Administered Date(s) Administered   Influenza-Unspecified 01/11/2019, 01/16/2021   PFIZER(Purple Top)SARS-COV-2 Vaccination 06/16/2019, 07/10/2019, 03/09/2020, 08/14/2020, 01/30/2021   Tdap 10/30/2021   Zoster Recombinat (Shingrix) 10/24/2020, 01/02/2021     Objective: Vital Signs: BP 123/74 (BP Location: Right Arm, Patient Position: Sitting, Cuff Size: Normal)   Pulse 63   Resp 16   Ht '5\' 5"'$  (1.651 m)   Wt 157 lb 3.2 oz (71.3 kg)   BMI 26.16 kg/m    Physical Exam Vitals and nursing note reviewed.  Constitutional:      Appearance: She is well-developed.  HENT:     Head: Normocephalic and atraumatic.  Eyes:     Conjunctiva/sclera: Conjunctivae normal.  Cardiovascular:     Rate and Rhythm: Normal rate and regular rhythm.     Heart sounds: Normal heart sounds.  Pulmonary:     Effort: Pulmonary effort is normal.     Breath sounds: Normal breath sounds.  Abdominal:     General: Bowel sounds are normal.     Palpations: Abdomen is soft.  Musculoskeletal:     Cervical back: Normal range of motion.  Lymphadenopathy:     Cervical: No cervical adenopathy.  Skin:    General: Skin is warm and dry.     Capillary Refill: Capillary refill takes less than 2 seconds.  Neurological:     Mental Status: She is alert and oriented to person, place, and time.  Psychiatric:        Behavior: Behavior normal.      Musculoskeletal Exam: She had limited lateral rotation of the cervical spine.  Thoracic kyphosis was noted.  There was no tenderness on palpation of her thoracic or lumbar spine.  Shoulder joints and elbow joints with good  range of motion.  No tenderness over wrist joints or MCPs.  No synovitis was noted.  CMC, PIP and DIP thickening consistent with osteoarthritis was noted.  Hip joints and knee joints with good range of motion.  She had right AFO brace.  There was no tenderness over left ankle joint or MTP joints.  CDAI Exam: CDAI Score: -- Patient Global: --; Provider Global: -- Swollen: --; Tender: -- Joint Exam 02/19/2022   No joint exam has been documented for this visit   There is currently no information documented on the homunculus. Go to the Rheumatology activity and complete the homunculus joint exam.  Investigation: No additional findings.  Imaging: No results found.  Recent Labs: Lab Results  Component Value Date   NA 141 09/18/2021   K 4.1 09/18/2021   CL 104  09/18/2021   CO2 31 09/18/2021   GLUCOSE 89 09/18/2021   BUN 11 09/18/2021   CREATININE 0.82 09/18/2021   BILITOT 0.6 09/18/2021   ALKPHOS 143 (H) 09/18/2021   AST 22 09/18/2021   ALT 20 09/18/2021   PROT 7.2 09/18/2021   ALBUMIN 4.1 09/18/2021   CALCIUM 9.6 09/18/2021    Speciality Comments: Boniva 1 dose 03/21-SEs actonel 04/21  Procedures:  No procedures performed Allergies: Patient has no known allergies.   Assessment / Plan:     Visit Diagnoses: Sicca syndrome ([place]) -been experiencing increased dry mouth and dry eye symptoms.  She had autoimmune work-up several years ago which was negative.  Over-the-counter products were discussed.  She is also using Restasis.  I will obtain labs today.  We will call her back with the lab results once available.  I discussed the option of pilocarpine.  Indications side effects contraindications were discussed.  Patient declined pilocarpine.  Plan: Sedimentation rate, Sjogrens syndrome-A extractable nuclear antibody, Sjogrens syndrome-B extractable nuclear antibody, ANA, C3 and C4, Rheumatoid factor  Primary osteoarthritis of both hands-she had bilateral CMC, PIP and DIP thickening.   Joint protection muscle strengthening was discussed.  Primary osteoarthritis of both feet-proper fitting shoes were advised.  Right foot drop -she is going to physical therapy and doing balance training.  Related to DDD-L-uses AFO brace as needed especially when walking prolonged distances.  DDD (degenerative disc disease), cervical-she had limited range of motion without much discomfort.  Other form of scoliosis of thoracolumbar spine-she is intermittent pain in her lower back.  Spondylosis without myelopathy or radiculopathy, lumbar region  DDD (degenerative disc disease), lumbar  Other osteoporosis without current pathological fracture - Actonel 150 mg tablets every month.DEXA updated on 08/21/2020: Left femoral neck BMD 0.551 with T score -2.7.  DEXA ordered by her gynecologist.  She has been on Actonel since April 2021.  If her DEXA scan improves then she can go on a drug holiday after the next DEXA scan.  Use of calcium and vitamin D was advised.  Resistive exercises were also advised.  Vitamin D deficiency -there is history of vitamin D deficiency.  We will check vitamin D level.  Plan: VITAMIN D 25 Hydroxy (Vit-D Deficiency, Fractures)  Fibromyalgia syndrome-she continues with less pain and discomfort.  Need for regular exercises and stretching was discussed.  Other fatigue - -related to fibromyalgia..  Plan: CBC with Differential/Platelet, COMPLETE METABOLIC PANEL WITH GFR, Serum protein electrophoresis with reflex  History of migraine  Orders: Orders Placed This Encounter  Procedures   CBC with Differential/Platelet   COMPLETE METABOLIC PANEL WITH GFR   Sedimentation rate   VITAMIN D 25 Hydroxy (Vit-D Deficiency, Fractures)   Sjogrens syndrome-A extractable nuclear antibody   Sjogrens syndrome-B extractable nuclear antibody   ANA   C3 and C4   Rheumatoid factor   Serum protein electrophoresis with reflex   No orders of the defined types were placed in this  encounter.    Follow-Up Instructions: Return in about 3 months (around 05/22/2022) for sicca, OA, OP.   Bo Merino, MD  Note - This record has been created using Editor, commissioning.  Chart creation errors have been sought, but may not always  have been located. Such creation errors do not reflect on  the standard of medical care.

## 2022-02-17 ENCOUNTER — Other Ambulatory Visit: Payer: Self-pay | Admitting: Family Medicine

## 2022-02-19 ENCOUNTER — Ambulatory Visit: Payer: 59 | Attending: Rheumatology | Admitting: Rheumatology

## 2022-02-19 ENCOUNTER — Encounter: Payer: Self-pay | Admitting: Rheumatology

## 2022-02-19 VITALS — BP 123/74 | HR 63 | Resp 16 | Ht 65.0 in | Wt 157.2 lb

## 2022-02-19 DIAGNOSIS — M3500 Sicca syndrome, unspecified: Secondary | ICD-10-CM

## 2022-02-19 DIAGNOSIS — M47816 Spondylosis without myelopathy or radiculopathy, lumbar region: Secondary | ICD-10-CM

## 2022-02-19 DIAGNOSIS — M21371 Foot drop, right foot: Secondary | ICD-10-CM

## 2022-02-19 DIAGNOSIS — M19072 Primary osteoarthritis, left ankle and foot: Secondary | ICD-10-CM

## 2022-02-19 DIAGNOSIS — M19042 Primary osteoarthritis, left hand: Secondary | ICD-10-CM

## 2022-02-19 DIAGNOSIS — M797 Fibromyalgia: Secondary | ICD-10-CM

## 2022-02-19 DIAGNOSIS — M51369 Other intervertebral disc degeneration, lumbar region without mention of lumbar back pain or lower extremity pain: Secondary | ICD-10-CM

## 2022-02-19 DIAGNOSIS — M19071 Primary osteoarthritis, right ankle and foot: Secondary | ICD-10-CM

## 2022-02-19 DIAGNOSIS — M19041 Primary osteoarthritis, right hand: Secondary | ICD-10-CM

## 2022-02-19 DIAGNOSIS — M4185 Other forms of scoliosis, thoracolumbar region: Secondary | ICD-10-CM

## 2022-02-19 DIAGNOSIS — M5030 Other cervical disc degeneration, unspecified cervical region: Secondary | ICD-10-CM

## 2022-02-19 DIAGNOSIS — R5383 Other fatigue: Secondary | ICD-10-CM

## 2022-02-19 DIAGNOSIS — E559 Vitamin D deficiency, unspecified: Secondary | ICD-10-CM

## 2022-02-19 DIAGNOSIS — M5136 Other intervertebral disc degeneration, lumbar region: Secondary | ICD-10-CM

## 2022-02-19 DIAGNOSIS — M818 Other osteoporosis without current pathological fracture: Secondary | ICD-10-CM

## 2022-02-19 DIAGNOSIS — Z8669 Personal history of other diseases of the nervous system and sense organs: Secondary | ICD-10-CM

## 2022-02-22 LAB — COMPLETE METABOLIC PANEL WITH GFR
AG Ratio: 1.5 (calc) (ref 1.0–2.5)
ALT: 20 U/L (ref 6–29)
AST: 20 U/L (ref 10–35)
Albumin: 4.3 g/dL (ref 3.6–5.1)
Alkaline phosphatase (APISO): 143 U/L (ref 37–153)
BUN: 18 mg/dL (ref 7–25)
CO2: 31 mmol/L (ref 20–32)
Calcium: 9.7 mg/dL (ref 8.6–10.4)
Chloride: 104 mmol/L (ref 98–110)
Creat: 0.77 mg/dL (ref 0.50–1.05)
Globulin: 2.8 g/dL (calc) (ref 1.9–3.7)
Glucose, Bld: 84 mg/dL (ref 65–99)
Potassium: 4.4 mmol/L (ref 3.5–5.3)
Sodium: 141 mmol/L (ref 135–146)
Total Bilirubin: 0.3 mg/dL (ref 0.2–1.2)
Total Protein: 7.1 g/dL (ref 6.1–8.1)
eGFR: 87 mL/min/{1.73_m2} (ref >=60)

## 2022-02-22 LAB — CBC WITH DIFFERENTIAL/PLATELET
Absolute Monocytes: 532 cells/uL (ref 200–950)
Basophils Absolute: 62 cells/uL (ref 0–200)
Basophils Relative: 1.1 %
Eosinophils Absolute: 263 cells/uL (ref 15–500)
Eosinophils Relative: 4.7 %
HCT: 45.1 % — ABNORMAL HIGH (ref 35.0–45.0)
Hemoglobin: 15.1 g/dL (ref 11.7–15.5)
Lymphs Abs: 1249 cells/uL (ref 850–3900)
MCH: 31.9 pg (ref 27.0–33.0)
MCHC: 33.5 g/dL (ref 32.0–36.0)
MCV: 95.1 fL (ref 80.0–100.0)
MPV: 11.8 fL (ref 7.5–12.5)
Monocytes Relative: 9.5 %
Neutro Abs: 3494 cells/uL (ref 1500–7800)
Neutrophils Relative %: 62.4 %
Platelets: 230 10*3/uL (ref 140–400)
RBC: 4.74 10*6/uL (ref 3.80–5.10)
RDW: 11.9 % (ref 11.0–15.0)
Total Lymphocyte: 22.3 %
WBC: 5.6 10*3/uL (ref 3.8–10.8)

## 2022-02-22 LAB — PROTEIN ELECTROPHORESIS, SERUM, WITH REFLEX
Albumin ELP: 4.3 g/dL (ref 3.8–4.8)
Alpha 1: 0.3 g/dL (ref 0.2–0.3)
Alpha 2: 0.7 g/dL (ref 0.5–0.9)
Beta 2: 0.5 g/dL (ref 0.2–0.5)
Beta Globulin: 0.5 g/dL (ref 0.4–0.6)
Gamma Globulin: 1 g/dL (ref 0.8–1.7)
Total Protein: 7.3 g/dL (ref 6.1–8.1)

## 2022-02-22 LAB — ANTI-NUCLEAR AB-TITER (ANA TITER): ANA Titer 1: 1:40 {titer} — ABNORMAL HIGH

## 2022-02-22 LAB — C3 AND C4
C3 Complement: 139 mg/dL (ref 83–193)
C4 Complement: 35 mg/dL (ref 15–57)

## 2022-02-22 LAB — SEDIMENTATION RATE: Sed Rate: 11 mm/h (ref 0–30)

## 2022-02-22 LAB — ANA: Anti Nuclear Antibody (ANA): POSITIVE — AB

## 2022-02-22 LAB — VITAMIN D 25 HYDROXY (VIT D DEFICIENCY, FRACTURES): Vit D, 25-Hydroxy: 51 ng/mL (ref 30–100)

## 2022-02-22 LAB — SJOGRENS SYNDROME-A EXTRACTABLE NUCLEAR ANTIBODY: SSA (Ro) (ENA) Antibody, IgG: <1 AI

## 2022-02-22 LAB — SJOGRENS SYNDROME-B EXTRACTABLE NUCLEAR ANTIBODY: SSB (La) (ENA) Antibody, IgG: <1 AI

## 2022-02-22 LAB — RHEUMATOID FACTOR: Rheumatoid fact SerPl-aCnc: <14 IU/mL (ref <14)

## 2022-02-22 NOTE — Progress Notes (Signed)
CBC normal, CMP normal, ANA low titer which is not significant, sedimentation rate normal, complements normal Sjogren's antibodies negative, rheumatoid factor negative, SPEP normal.

## 2022-05-12 NOTE — Progress Notes (Unsigned)
Office Visit Note  Patient: Alexis Hicks             Date of Birth: 05/02/1960           MRN: [id number]             PCP: Wendie Agreste, MD Referring: Wendie Agreste, MD Visit Date: 05/26/2022 Occupation: @GUAROCC$ @  Subjective:  Pain in multiple joints  History of Present Illness: Alexis Hicks is a 62 y.o. female with history of sicca syndrome, osteoarthritis, and DDD.  Patient continues to have ongoing dry eyes, nasal dryness, and mouth dryness.  She has been using Restasis in the right eye which has been helpful.  Patient states she is also been using a humidifier at night to help with the dryness and a saline nasal spray at bedtime.  She has been trying to keep her fluid intake up throughout the day.  She denies any parotid swelling or tenderness.  She denies any swollen lymph nodes.  She has been taking omega-3 supplements as encouraged. She continues to have bilateral CMC joint pain and has been wearing braces as needed.  She has been going to physical therapy every 3 weeks for balance and neck pain.  She has been experiencing increased right-sided lower back pain and would like a new order for PT.  She continues to experience intermittent myalgias and muscle tenderness due to fibromyalgia. She remains on Actonel as prescribed and is taking calcium and vitamin D supplement daily.  She is due to update her bone density in May 2024.     Activities of Daily Living:  Patient reports morning stiffness for 5 minutes.   Patient Reports nocturnal pain.  Difficulty dressing/grooming: Denies Difficulty climbing stairs: Denies Difficulty getting out of chair: Denies Difficulty using hands for taps, buttons, cutlery, and/or writing: Reports  Review of Systems  Constitutional:  Positive for fatigue.  HENT:  Positive for mouth dryness. Negative for mouth sores.   Eyes:  Positive for dryness.  Respiratory: Negative.  Negative for shortness of breath.   Cardiovascular:  Negative.  Negative for chest pain and palpitations.  Gastrointestinal: Negative.  Negative for blood in stool, constipation and diarrhea.  Endocrine: Negative.  Negative for increased urination.  Genitourinary: Negative.  Negative for involuntary urination.  Musculoskeletal:  Positive for joint pain, gait problem, joint pain, muscle weakness, morning stiffness and muscle tenderness. Negative for joint swelling, myalgias and myalgias.  Skin:  Positive for rash. Negative for color change, hair loss and sensitivity to sunlight.  Allergic/Immunologic: Negative.  Negative for susceptible to infections.  Neurological:  Negative for dizziness and headaches.  Hematological: Negative.  Negative for swollen glands.  Psychiatric/Behavioral: Negative.  Negative for depressed mood and sleep disturbance. The patient is not nervous/anxious.     PMFS History:  Patient Active Problem List   Diagnosis Date Noted   Skin lesion 12/02/2021   Benign mole 09/23/2021   DDD (degenerative disc disease), lumbar 01/09/2021   Right foot drop 01/09/2021   Other osteoporosis without current pathological fracture 01/09/2021   Osteopenia of multiple sites 08/25/2016   Vitamin D deficiency 08/25/2016   Fatigue 02/17/2016   Primary osteoarthritis of both feet 02/17/2016   DDD Cspine 02/17/2016   Osteoarthritis of lumbar spine 02/17/2016   Weakness of right lower extremity 02/17/2016   Migraine without status migrainosus, not intractable 02/17/2016   Raynaud's  02/17/2016   Sicca syndrome ([place]) 02/17/2016   DUB (dysfunctional uterine bleeding)    Fibromyalgia syndrome 09/10/2011  Primary osteoarthritis of both hands 09/10/2011   Scoliosis 09/10/2011    Past Medical History:  Diagnosis Date   Chronically dry eyes, right    Dry mouth    DUB (dysfunctional uterine bleeding)    Fibromyalgia    Osteoporosis 04/2018   Dr. Butler [place]   Pneumonia 04/2021   Raynaud's syndrome     Family History  Problem Relation  Age of Onset   Arthritis Mother    Osteoporosis Mother    Dementia Mother    Skin cancer Father    Heart disease Father    Metabolic syndrome Brother    Diabetes Brother    Hypertension Brother    High Cholesterol Brother    Stroke Brother    Uterine cancer Maternal Aunt    Breast cancer Paternal Aunt    Depression Maternal Grandmother    Hypertension Maternal Grandmother    Stroke Maternal Grandmother    Kidney disease Maternal Grandfather    Breast cancer Paternal Grandmother    Past Surgical History:  Procedure Laterality Date   MOLE REMOVAL  12/02/2021   face   SALPINGECTOMY     Social History   Social History Narrative   Not on file   Immunization History  Administered Date(s) Administered   COVID-19, mRNA, vaccine(Comirnaty)12 years and older 04/17/2022   Influenza Inj Mdck Quad Pf 12/27/2021   Influenza-Unspecified 01/11/2019, 01/16/2021   PFIZER(Purple Top)SARS-COV-2 Vaccination 06/16/2019, 07/10/2019, 03/09/2020, 08/14/2020, 01/30/2021   Tdap 10/30/2021   Zoster Recombinat (Shingrix) 10/24/2020, 01/02/2021     Objective: Vital Signs: BP 116/74 (BP Location: Left Arm, Patient Position: Sitting, Cuff Size: Normal)   Pulse (!) 56   Resp 14   Ht 5' 5"$  (1.651 m)   Wt 156 lb 12.8 oz (71.1 kg)   BMI 26.09 kg/m    Physical Exam Vitals and nursing note reviewed.  Constitutional:      Appearance: She is well-developed.  HENT:     Head: Normocephalic and atraumatic.  Eyes:     Conjunctiva/sclera: Conjunctivae normal.  Cardiovascular:     Rate and Rhythm: Normal rate and regular rhythm.     Heart sounds: Normal heart sounds.  Pulmonary:     Effort: Pulmonary effort is normal.     Breath sounds: Normal breath sounds.  Abdominal:     General: Bowel sounds are normal.     Palpations: Abdomen is soft.  Musculoskeletal:     Cervical back: Normal range of motion.  Skin:    General: Skin is warm and dry.     Capillary Refill: Capillary refill takes less  than 2 seconds.  Neurological:     Mental Status: She is alert and oriented to person, place, and time.  Psychiatric:        Behavior: Behavior normal.      Musculoskeletal Exam: C-spine has slightly limited ROM with lateral rotation.  Thoracic kyphosis noted.  Discomfort with lumbar ROM.  Shoulder joints, elbow joints, wrist joints, MCPs, PIPs, DIPs have good range of motion with no synovitis.  CMC joint, PIP, and DIP thickening consistent with osteoarthritis of both hands.  Hip joints have good range of motion with no groin pain.  Knee joints have good range of motion with no warmth or effusion.   CDAI Exam: CDAI Score: -- Patient Global: --; Provider Global: -- Swollen: --; Tender: -- Joint Exam 05/26/2022   No joint exam has been documented for this visit   There is currently no information documented on the homunculus. Go  to the Rheumatology activity and complete the homunculus joint exam.  Investigation: No additional findings.  Imaging: No results found.  Recent Labs: Lab Results  Component Value Date   WBC 5.6 02/19/2022   HGB 15.1 02/19/2022   PLT 230 02/19/2022   NA 141 02/19/2022   K 4.4 02/19/2022   CL 104 02/19/2022   CO2 31 02/19/2022   GLUCOSE 84 02/19/2022   BUN 18 02/19/2022   CREATININE 0.77 02/19/2022   BILITOT 0.3 02/19/2022   ALKPHOS 143 (H) 09/18/2021   AST 20 02/19/2022   ALT 20 02/19/2022   PROT 7.1 02/19/2022   PROT 7.3 02/19/2022   ALBUMIN 4.1 09/18/2021   CALCIUM 9.7 02/19/2022    Speciality Comments: Boniva 1 dose 03/21-SEs actonel 04/21  Procedures:  No procedures performed Allergies: Patient has no known allergies.    Assessment / Plan:     Visit Diagnoses: Sicca syndrome ([place]) -  Lab work from 02/19/22 was reviewed today in the office: SPEP did not reveal any abnormal protein bands, ANA 1:140 Nuclear fine speckled, ESR 11, Vitamin D 51, Ro-, La-, RF<14, complements WNL.  Reviewed results with the patient today in the office and  a printed copy of the labs were provided to the patient. She continues to have symptoms of chronic right eye dryness, nasal dryness, and mouth dryness.  She has been using Restasis in the right eye, nasal saline spray at bedtime, and has increased her fluid intake for mouth dryness.  She has also been using a humidifier at night and has increased her omega-3 intake as recommended.  She has no parotid swelling or tenderness.  No cervical lymphadenopathy.  Patient has declined the use of pilocarpine.  She does not require immunosuppressive therapy at this time.  Primary osteoarthritis of both hands: PIP and DIP thickening consistent with osteoarthritis of both hands.  CMC joint thickening noted bilaterally.  No inflammation noted.  Complete fist formation bilaterally.  Primary osteoarthritis of both feet: She is wearing proper fitting shoes.   Right foot drop: Using AFO brace.   DDD (degenerative disc disease), cervical: Patient has been going to PT every 3 weeks for neck pain and stiffness.  Other form of scoliosis of thoracolumbar spine  Spondylosis without myelopathy or radiculopathy, lumbar region: Referral to PT placed.  DDD (degenerative disc disease), lumbar:No recent injury.  She is experiencing increased right sided lower back pain. Referral to physical therapy will be placed today to improve her lower back pain.  She was advised to notify us if her symptoms persist or worsen at which time we can refer her to a spine specialist for further evaluation and management.  Other osteoporosis without current pathological fracture - She remains on Actonel 150 mg tablets every month-started in April 2021. DEXA updated on 08/21/2020: Left femoral neck BMD 0.551 with T score -2.7.  DEXA ordered by her gynecologist.  Due to update DEXA in May 2024.  Patient will likely require a drug holiday. She continues to take a calcium and vitamin D supplement daily.  She plans on having DEXA results forwarded to  our office to review.  Vitamin D deficiency: She is taking a vitamin D and calcium supplement daily.  Fibromyalgia syndrome: She continues to experience intermittent myalgias and muscle tenderness due to fibromyalgia.  She remains active stretching and exercising on a regular basis.  She is planning on starting to practice yoga more frequently.  She continues to go to physical therapy every 3 weeks to improve  her balance and neck pain and stiffness. Discussed the importance of regular exercise and good sleep hygiene.   Other fatigue: Stable.   History of migraine  Orders: No orders of the defined types were placed in this encounter.  No orders of the defined types were placed in this encounter.    Follow-Up Instructions: Return in about 6 months (around 11/24/2022) for Sicca syndrome, Osteoarthritis, DDD.   Ofilia Neas, PA-C  Note - This record has been created using Dragon software.  Chart creation errors have been sought, but may not always  have been located. Such creation errors do not reflect on  the standard of medical care.

## 2022-05-22 ENCOUNTER — Encounter: Payer: Self-pay | Admitting: Family Medicine

## 2022-05-22 ENCOUNTER — Ambulatory Visit: Payer: 59 | Admitting: Family Medicine

## 2022-05-22 VITALS — BP 112/70 | HR 63 | Temp 98.4°F | Resp 16 | Ht 65.0 in | Wt 158.0 lb

## 2022-05-22 DIAGNOSIS — J329 Chronic sinusitis, unspecified: Secondary | ICD-10-CM

## 2022-05-22 DIAGNOSIS — R5383 Other fatigue: Secondary | ICD-10-CM

## 2022-05-22 DIAGNOSIS — B9689 Other specified bacterial agents as the cause of diseases classified elsewhere: Secondary | ICD-10-CM | POA: Diagnosis not present

## 2022-05-22 LAB — POC COVID19 BINAXNOW

## 2022-05-22 MED ORDER — AMOXICILLIN 875 MG PO TABS: 875.0000 mg | ORAL_TABLET | Freq: Two times a day (BID) | ORAL | 0 refills | Status: AC

## 2022-05-22 MED ORDER — GUAIFENESIN-CODEINE 100-10 MG/5ML PO SYRP: 10.0000 mL | ORAL_SOLUTION | Freq: Three times a day (TID) | ORAL | 0 refills | Status: DC | PRN

## 2022-05-22 NOTE — Progress Notes (Signed)
   Subjective:    Patient ID: Alexis Hicks, female    DOB: April 08, 1960, 62 y.o.   MRN: [id number]  HPI Cough- pt reports she had a sore throat 'all last week'.  Had a negative COVID test.  Increased nasal congestion and cough.  No fever.  2 nights ago had a 'choking cough'.  Hx of PNA.  Had some dizziness yesterday.  Some shortness of breath w/ prolonged talking.  Developed facial pain and ear pressure.  Did have tooth pain earlier.  Cough is intermittently productive.  Pt is using Flonase.   Review of Systems For ROS see HPI     Objective:   Physical Exam Vitals reviewed.  Constitutional:      General: She is not in acute distress.    Appearance: Normal appearance. She is well-developed. She is not ill-appearing.  HENT:     Head: Normocephalic and atraumatic.     Right Ear: Tympanic membrane normal.     Left Ear: Tympanic membrane normal.     Nose: Mucosal edema, congestion and rhinorrhea present.     Right Sinus: Maxillary sinus tenderness and frontal sinus tenderness present.     Left Sinus: Maxillary sinus tenderness and frontal sinus tenderness present.     Mouth/Throat:     Pharynx: Uvula midline. Posterior oropharyngeal erythema present. No oropharyngeal exudate.  Eyes:     Conjunctiva/sclera: Conjunctivae normal.     Pupils: Pupils are equal, round, and reactive to light.  Cardiovascular:     Rate and Rhythm: Normal rate and regular rhythm.     Heart sounds: Normal heart sounds.  Pulmonary:     Effort: Pulmonary effort is normal. No respiratory distress.     Breath sounds: Normal breath sounds. No wheezing.     Comments: + dry cough Musculoskeletal:     Cervical back: Normal range of motion and neck supple.  Lymphadenopathy:     Cervical: No cervical adenopathy.  Skin:    General: Skin is warm and dry.  Neurological:     General: No focal deficit present.     Mental Status: She is alert and oriented to person, place, and time.     Cranial Nerves: No cranial  nerve deficit.     Motor: No weakness.     Coordination: Coordination normal.  Psychiatric:        Mood and Affect: Mood normal.        Behavior: Behavior normal.        Thought Content: Thought content normal.           Assessment & Plan:  Bacterial sinusitis- new.  Pt's duration of illness, sxs, and PE consistent w/ infxn.  Start Amoxicillin 847m BID and cough syrup prn.  Pt reports she does not tolerate antihistamines very well due to urinary hesitancy.  Encouraged her to try OTC phenylephrine to decrease PND.  Reviewed supportive care and red flags that should prompt return.  Pt expressed understanding and is in agreement w/ plan.

## 2022-05-22 NOTE — Patient Instructions (Signed)
Follow up as needed or as scheduled START the Amoxicillin to treat the sinus infection Drink LOTS of fluids USE the cough syrup as needed You can use Robitussin or Delsym for daytime cough w/o drowsiness Try OTC Phenylephrine to help decrease congestion and drainage Call with any questions or concerns Hang in there!!!

## 2022-05-26 ENCOUNTER — Other Ambulatory Visit: Payer: Self-pay | Admitting: *Deleted

## 2022-05-26 ENCOUNTER — Ambulatory Visit: Payer: 59 | Attending: Physician Assistant | Admitting: Physician Assistant

## 2022-05-26 ENCOUNTER — Encounter: Payer: Self-pay | Admitting: Physician Assistant

## 2022-05-26 VITALS — BP 116/74 | HR 56 | Resp 14 | Ht 65.0 in | Wt 156.8 lb

## 2022-05-26 DIAGNOSIS — M21371 Foot drop, right foot: Secondary | ICD-10-CM

## 2022-05-26 DIAGNOSIS — M797 Fibromyalgia: Secondary | ICD-10-CM

## 2022-05-26 DIAGNOSIS — G8929 Other chronic pain: Secondary | ICD-10-CM

## 2022-05-26 DIAGNOSIS — M3500 Sicca syndrome, unspecified: Secondary | ICD-10-CM | POA: Diagnosis not present

## 2022-05-26 DIAGNOSIS — M5030 Other cervical disc degeneration, unspecified cervical region: Secondary | ICD-10-CM

## 2022-05-26 DIAGNOSIS — M19071 Primary osteoarthritis, right ankle and foot: Secondary | ICD-10-CM | POA: Diagnosis not present

## 2022-05-26 DIAGNOSIS — E559 Vitamin D deficiency, unspecified: Secondary | ICD-10-CM

## 2022-05-26 DIAGNOSIS — M19072 Primary osteoarthritis, left ankle and foot: Secondary | ICD-10-CM

## 2022-05-26 DIAGNOSIS — Z8669 Personal history of other diseases of the nervous system and sense organs: Secondary | ICD-10-CM

## 2022-05-26 DIAGNOSIS — M19041 Primary osteoarthritis, right hand: Secondary | ICD-10-CM

## 2022-05-26 DIAGNOSIS — M19042 Primary osteoarthritis, left hand: Secondary | ICD-10-CM

## 2022-05-26 DIAGNOSIS — M47816 Spondylosis without myelopathy or radiculopathy, lumbar region: Secondary | ICD-10-CM

## 2022-05-26 DIAGNOSIS — M818 Other osteoporosis without current pathological fracture: Secondary | ICD-10-CM

## 2022-05-26 DIAGNOSIS — M5136 Other intervertebral disc degeneration, lumbar region: Secondary | ICD-10-CM

## 2022-05-26 DIAGNOSIS — M4185 Other forms of scoliosis, thoracolumbar region: Secondary | ICD-10-CM

## 2022-05-26 DIAGNOSIS — R5383 Other fatigue: Secondary | ICD-10-CM

## 2022-07-28 ENCOUNTER — Telehealth: Payer: Self-pay

## 2022-07-28 DIAGNOSIS — Z130 Encounter for screening for diseases of the blood and blood-forming organs and certain disorders involving the immune mechanism: Secondary | ICD-10-CM

## 2022-07-28 DIAGNOSIS — Z13220 Encounter for screening for lipoid disorders: Secondary | ICD-10-CM

## 2022-07-28 DIAGNOSIS — Z131 Encounter for screening for diabetes mellitus: Secondary | ICD-10-CM

## 2022-07-28 DIAGNOSIS — Z0000 Encounter for general adult medical examination without abnormal findings: Secondary | ICD-10-CM

## 2022-07-28 DIAGNOSIS — Z1329 Encounter for screening for other suspected endocrine disorder: Secondary | ICD-10-CM

## 2022-07-28 NOTE — Telephone Encounter (Signed)
Rescheduled physical and ordered labs

## 2022-09-09 LAB — HM MAMMOGRAPHY

## 2022-09-17 ENCOUNTER — Other Ambulatory Visit: Payer: Self-pay | Admitting: Obstetrics and Gynecology

## 2022-09-17 DIAGNOSIS — M810 Age-related osteoporosis without current pathological fracture: Secondary | ICD-10-CM

## 2022-09-22 LAB — HM PAP SMEAR
HM Pap smear: NEGATIVE
HPV, high-risk: NEGATIVE

## 2022-09-23 ENCOUNTER — Encounter: Payer: 59 | Admitting: Family Medicine

## 2022-09-28 ENCOUNTER — Encounter: Payer: Self-pay | Admitting: Family Medicine

## 2022-09-28 ENCOUNTER — Telehealth: Payer: Self-pay | Admitting: Family Medicine

## 2022-09-28 ENCOUNTER — Telehealth (INDEPENDENT_AMBULATORY_CARE_PROVIDER_SITE_OTHER): Payer: 59 | Admitting: Family Medicine

## 2022-09-28 VITALS — Temp 98.7°F

## 2022-09-28 DIAGNOSIS — U071 COVID-19: Secondary | ICD-10-CM

## 2022-09-28 MED ORDER — NIRMATRELVIR/RITONAVIR (PAXLOVID)TABLET
3.0000 | ORAL_TABLET | Freq: Two times a day (BID) | ORAL | 0 refills | Status: AC
Start: 2022-09-28 — End: 2022-10-03

## 2022-09-28 MED ORDER — BENZONATATE 100 MG PO CAPS: 100.0000 mg | ORAL_CAPSULE | Freq: Three times a day (TID) | ORAL | 0 refills | Status: DC | PRN

## 2022-09-28 NOTE — Progress Notes (Signed)
Virtual Visit via Video Note  I connected with Alexis Hicks on 09/28/22 at 1:51 PM by a video enabled telemedicine application and verified that I am speaking with the correct person using two identifiers.  Patient location: home, with husband - consent given to discuss PHI.  My location: office - [place].    I discussed the limitations, risks, security and privacy concerns of performing an evaluation and management service by telephone and the availability of in person appointments. I also discussed with the patient that there may be a patient responsible charge related to this service. The patient expressed understanding and agreed to proceed, consent obtained  Chief complaint:  Chief Complaint  Patient presents with   Covid Positive    Tested positive Sunday just got home from [place], notes husband was positive Friday last week and was given Paxlovid, notes congestion, fever intermittently, body aches, fatigue, cough, nausea     History of Present Illness: Cortnee Steinmiller is a 62 y.o. female  COVID-19 infection: Symptoms started with sore throat 3 days ago. More symptoms next day or two - cough, congestion, worse sore thraot, body aches.  Drinking fluids, no chest pain/dyspnea/confusion.  Positive test yesterday. Positive sick contact, husband positive 3 days ago - he is better on paxlovid.  Recent travel from [place]. Would like to try higher paxlovid  COVID booster 04/17/2022. eGFR 87 on 02/2022.  Tx: advil, vit C and zinc.   Immunization History  Administered Date(s) Administered   COVID-19, mRNA, vaccine(Comirnaty)12 years and older 04/17/2022   Influenza Inj Mdck Quad Pf 12/27/2021   Influenza-Unspecified 01/11/2019, 01/16/2021   PFIZER(Purple Top)SARS-COV-2 Vaccination 06/16/2019, 07/10/2019, 03/09/2020, 08/14/2020, 01/30/2021   Tdap 10/30/2021   Zoster Recombinat (Shingrix) 10/24/2020, 01/02/2021        Patient Active Problem List    Diagnosis Date Noted   Skin lesion 12/02/2021   Benign mole 09/23/2021   DDD (degenerative disc disease), lumbar 01/09/2021   Right foot drop 01/09/2021   Other osteoporosis without current pathological fracture 01/09/2021   Osteopenia of multiple sites 08/25/2016   Vitamin D deficiency 08/25/2016   Fatigue 02/17/2016   Primary osteoarthritis of both feet 02/17/2016   DDD Cspine 02/17/2016   Osteoarthritis of lumbar spine 02/17/2016   Weakness of right lower extremity 02/17/2016   Migraine without status migrainosus, not intractable 02/17/2016   Raynaud's  02/17/2016   Sicca syndrome ([facility]) 02/17/2016   DUB (dysfunctional uterine bleeding)    Fibromyalgia syndrome 09/10/2011   Primary osteoarthritis of both hands 09/10/2011   Scoliosis 09/10/2011   Past Medical History:  Diagnosis Date   Chronically dry eyes, right    Dry mouth    DUB (dysfunctional uterine bleeding)    Fibromyalgia    Osteoporosis 04/2018   Dr. Layne Benton   Pneumonia 04/2021   Raynaud's syndrome    Past Surgical History:  Procedure Laterality Date   MOLE REMOVAL  12/02/2021   face   SALPINGECTOMY     No Known Allergies Prior to Admission medications   Medication Sig Start Date End Date Taking? Authorizing Provider  ascorbic Acid (VITAMIN C) 500 MG CPCR as needed.   Yes [provider]  Biotin 1 MG CAPS Take by mouth.   Yes [provider]  Calcium Carbonate-Vitamin D (CALCIUM 600+D HIGH POTENCY PO) calcium   Yes [provider]  cholecalciferol (VITAMIN D) 1000 units tablet Take 2,000 Units by mouth daily.    Yes [provider]  Cyanocobalamin (VITAMIN B 12 PO) Take by  mouth.   Yes [provider]  cycloSPORINE (RESTASIS) 0.05 % ophthalmic emulsion 1 drop daily.   Yes [provider]  fluticasone (FLONASE) 50 MCG/ACT nasal spray SPRAY 2 SPRAYS INTO EACH NOSTRIL EVERY DAY 02/17/22  Yes Shade Flood, MD  Omega-3 Fatty Acids (FISH OIL PO) Take by  mouth daily.   Yes [provider]  risedronate (ACTONEL) 150 MG tablet Take 150 mg by mouth every 30 (thirty) days. 01/11/20  Yes [provider]  triamcinolone cream (KENALOG) 0.1 % triamcinolone acetonide 0.1 % topical cream  APPLY TO SKIN TWICE DAILY X 14 DAYS   Yes [provider]  TURMERIC PO Take by mouth daily.   Yes [provider]  Zinc 10 MG LOZG Use as directed in the mouth or throat as needed.   Yes [provider]   Social History   Socioeconomic History   Marital status: Married    Spouse name: Not on file   Number of children: Not on file   Years of education: Not on file   Highest education level: Not on file  Occupational History   Not on file  Tobacco Use   Smoking status: Never    Passive exposure: Past   Smokeless tobacco: Never  Vaping Use   Vaping Use: Never used  Substance and Sexual Activity   Alcohol use: Yes    Comment: occ   Drug use: No   Sexual activity: Not Currently  Other Topics Concern   Not on file  Social History Narrative   Not on file   Social Determinants of Health   Financial Resource Strain: Not on file  Food Insecurity: Not on file  Transportation Needs: Not on file  Physical Activity: Not on file  Stress: Not on file  Social Connections: Not on file  Intimate Partner Violence: Not on file    Observations/Objective: Vitals:   09/28/22 1322  Temp: 98.7 F (37.1 C)  TempSrc: Temporal  Nontoxic appearance on video, speaking in full sentences, no respiratory distress audible wheeze or stridor.  Coherent responses, all questions answered with understanding plan expressed   Assessment and Plan: COVID-19 virus infection - Plan: benzonatate (TESSALON) 100 MG capsule, nirmatrelvir/ritonavir (PAXLOVID) 20 x 150 MG & 10 x 100MG  TABS First few days of illness, mild symptoms overall at this time, outpatient treatment.  Symptomatic care discussed, she would like to take Paxlovid, potential  risks and efficacy discussed.  Tessalon Perles if needed, Mucinex DM or Mucinex over-the-counter, fluids and rest.  RTC/ER precautions given.  Follow Up Instructions:  As needed  I discussed the assessment and treatment plan with the patient. The patient was provided an opportunity to ask questions and all were answered. The patient agreed with the plan and demonstrated an understanding of the instructions.   The patient was advised to call back or seek an in-person evaluation if the symptoms worsen or if the condition fails to improve as anticipated.   Shade Flood, MD

## 2022-09-28 NOTE — Telephone Encounter (Signed)
To get a prescription the patient must have an appointment we do not just send medications

## 2022-09-28 NOTE — Patient Instructions (Signed)
Sorry you are sick.  Make sure to continue to drink plenty of fluids.  Mucinex or Mucinex DM can be helpful for cough but I also sent Tessalon Perles to your pharmacy.  Paxlovid antiviral was also sent to the pharmacy.  If you do have significant side effects with that medication it would be reasonable to stop it.  If any chest pain, shortness of breath at rest, confusion or acute worsening be seen but I suspect you will be improving soon.  Hang in there, and let me know if there are questions.

## 2022-09-28 NOTE — Telephone Encounter (Signed)
Patient Name First: Brandilee Last: RICHARDSO N Gender: Female DOB: 1960/07/26 Age: 62 Y 10 M 21 D Return Phone Number: [phone] (Primary), [phone] (Secondary) Address: City/ State/ Zip: [place]  [address] Client [facility] [place] Night - C Client Site [facility] - Night Provider Meredith Staggers- MD Contact Type Call Who Is Calling Patient / Member / Family / Caregiver Call Type Triage / Clinical Relationship To Patient Self Return Phone Number [phone] (Primary) Chief Complaint Runny or Stuffy Nose Reason for Call Symptomatic / Request for Health Information Initial Comment Caller states her husband tested positive for covid and she tested positive for covid on Sunday and would like paxlovid. Sx. of tiredness, brain fog, runny nose, cough, Nauseous, cold sx., cough. Translation No Nurse Assessment Nurse: Ivy Lynn, RN, Kealey Date/Time (Eastern Time): 09/28/2022 6:24:37 AM Confirm and document reason for call. If symptomatic, describe symptoms. ---Called states she tested positive for covid yesterday. Having fatigue, cough, runny nose, nausea. Patient would like paxlovid. Does the patient have any new or worsening symptoms? ---Yes Will a triage be completed? ---Yes Related visit to physician within the last 2 weeks? ---No Does the PT have any chronic conditions? (i.e. diabetes, asthma, this includes High risk factors for pregnancy, etc.) ---No Is this a behavioral health or substance abuse call? ---No Guidelines Guideline Title Affirmed Question Affirmed Notes Nurse Date/Time (Eastern Time) COVID-19 - Diagnosed or Suspected [1] COVID-19 diagnosed by positive lab test (e.g., PCR, rapid self-test kit) AND [2] mild symptoms (e.g., cough, fever, Cotelleso, RN, Satira Mccallum 09/28/2022 6:26:08 AM PLEASE NOTE: All timestamps contained within this report are represented as [place] Standard  Time. CONFIDENTIALTY NOTICE: This fax transmission is intended only for the addressee. It contains information that is legally privileged, confidential or otherwise protected from use or disclosure. If you are not the intended recipient, you are strictly prohibited from reviewing, disclosing, copying using or disseminating any of this information or taking any action in reliance on or regarding this information. If you have received this fax in error, please notify us immediately by telephone so that we can arrange for its return to [place]. Phone: [phone], Toll-Free: [phone], Fax: [phone] Page: 2 of 2 Call Id: [id number] Guidelines Guideline Title Affirmed Question Affirmed Notes Nurse Date/Time Lamount Cohen Time) others) AND [3] no complications or SOB Disp. Time Lamount Cohen Time) Disposition Final User 09/28/2022 6:33:57 AM Call PCP within 24 Hours Yes Cotelleso, RN, Satira Mccallum Final Disposition 09/28/2022 6:33:57 AM Call PCP within 24 Hours Yes Cotelleso, RN, Satira Mccallum Disposition Overriden: Home Care Override Reason: Patient's symptoms need a higher level of care Caller Disagree/Comply Comply Caller Understands Yes PreDisposition Call Doctor Care Advice Given Per Guideline * You need to discuss this with your doctor (or NP/PA) within the next few days. * You need to discuss this with your doctor (or NP/ PA) within the next 24 hours. COVID-19 - HOW TO PROTECT OTHERS - WHEN YOU ARE SICK WITH COVID-19: * STAY HOME A MINIMUM OF 5 DAYS: People with MILD COVID-19 can STOP HOME ISOLATION AFTER 5 DAYS if (1) fever has been gone for 24 hours (without using fever medicine) AND (2) symptoms are better. Continue to wear a well-fitted mask for a full 10 days when around others. * WEAR A MASK FOR 10 DAYS: Wear a well-fitted mask for 10 full days any time you are around others inside your home or in public. Do not go to places where you are unable to wear a mask. [facility] HANDS  OFTEN: Wash hands often with  soap and water. After coughing or sneezing are important times. If soap and water are not available, use an alcoholbased hand sanitizer with at least 60% alcohol, covering all surfaces of your hands and rubbing them together until they feel dry. Avoid touching your eyes, nose, and mouth with unwashed hands. CALL BACK IF: * You become worse CARE ADVICE given per COVID-19 - DIAGNOSED OR SUSPECTED (Adult) guideline. * HOME REMEDY - HONEY: This old home remedy has been shown to help decrease coughing at night. The adult dosage is 2 teaspoons (10 ml) at bedtime. Comments User: Doristine Mango, RN Date/Time Lamount Cohen Time): 09/28/2022 6:34:33 AM Upgraded due to patient inquiring about use of paxlovid for her. Referrals REFERRED TO PCP OFFICE  Pt called has MyChart visit scheduled today

## 2022-10-05 ENCOUNTER — Encounter: Payer: 59 | Admitting: Family Medicine

## 2022-10-13 ENCOUNTER — Other Ambulatory Visit (INDEPENDENT_AMBULATORY_CARE_PROVIDER_SITE_OTHER): Payer: 59

## 2022-10-13 DIAGNOSIS — Z13220 Encounter for screening for lipoid disorders: Secondary | ICD-10-CM | POA: Diagnosis not present

## 2022-10-13 DIAGNOSIS — Z130 Encounter for screening for diseases of the blood and blood-forming organs and certain disorders involving the immune mechanism: Secondary | ICD-10-CM | POA: Diagnosis not present

## 2022-10-13 DIAGNOSIS — Z131 Encounter for screening for diabetes mellitus: Secondary | ICD-10-CM | POA: Diagnosis not present

## 2022-10-13 DIAGNOSIS — Z1329 Encounter for screening for other suspected endocrine disorder: Secondary | ICD-10-CM | POA: Diagnosis not present

## 2022-10-13 DIAGNOSIS — Z0000 Encounter for general adult medical examination without abnormal findings: Secondary | ICD-10-CM

## 2022-10-13 LAB — LIPID PANEL
Cholesterol: 209 mg/dL — ABNORMAL HIGH (ref 0–200)
HDL: 53 mg/dL (ref >39.00)
LDL Cholesterol: 139 mg/dL — ABNORMAL HIGH (ref 0–99)
NonHDL: 155.86
Total CHOL/HDL Ratio: 4
Triglycerides: 86 mg/dL (ref 0.0–149.0)
VLDL: 17.2 mg/dL (ref 0.0–40.0)

## 2022-10-13 LAB — COMPREHENSIVE METABOLIC PANEL
ALT: 15 U/L (ref 0–35)
AST: 19 U/L (ref 0–37)
Albumin: 4.1 g/dL (ref 3.5–5.2)
Alkaline Phosphatase: 105 U/L (ref 39–117)
BUN: 12 mg/dL (ref 6–23)
CO2: 27 mEq/L (ref 19–32)
Calcium: 9.4 mg/dL (ref 8.4–10.5)
Chloride: 105 mEq/L (ref 96–112)
Creatinine, Ser: 0.81 mg/dL (ref 0.40–1.20)
GFR: 76.98 mL/min (ref >60.00)
Glucose, Bld: 87 mg/dL (ref 70–99)
Potassium: 3.9 mEq/L (ref 3.5–5.1)
Sodium: 140 mEq/L (ref 135–145)
Total Bilirubin: 0.4 mg/dL (ref 0.2–1.2)
Total Protein: 7.1 g/dL (ref 6.0–8.3)

## 2022-10-13 LAB — CBC WITH DIFFERENTIAL/PLATELET
Basophils Absolute: 0 10*3/uL (ref 0.0–0.1)
Basophils Relative: 0.8 % (ref 0.0–3.0)
Eosinophils Absolute: 0.2 10*3/uL (ref 0.0–0.7)
Eosinophils Relative: 3.9 % (ref 0.0–5.0)
HCT: 43.1 % (ref 36.0–46.0)
Hemoglobin: 14.2 g/dL (ref 12.0–15.0)
Lymphocytes Relative: 15.4 % (ref 12.0–46.0)
Lymphs Abs: 0.9 10*3/uL (ref 0.7–4.0)
MCHC: 33 g/dL (ref 30.0–36.0)
MCV: 93.6 fl (ref 78.0–100.0)
Monocytes Absolute: 0.5 10*3/uL (ref 0.1–1.0)
Monocytes Relative: 7.8 % (ref 3.0–12.0)
Neutro Abs: 4.4 10*3/uL (ref 1.4–7.7)
Neutrophils Relative %: 72.1 % (ref 43.0–77.0)
Platelets: 234 10*3/uL (ref 150.0–400.0)
RBC: 4.6 Mil/uL (ref 3.87–5.11)
RDW: 12.6 % (ref 11.5–15.5)
WBC: 6 10*3/uL (ref 4.0–10.5)

## 2022-10-13 LAB — TSH: TSH: 1.9 u[IU]/mL (ref 0.35–5.50)

## 2022-10-13 LAB — HEMOGLOBIN A1C: Hgb A1c MFr Bld: 6 % (ref 4.6–6.5)

## 2022-10-16 ENCOUNTER — Encounter: Payer: 59 | Admitting: Family Medicine

## 2022-10-22 ENCOUNTER — Ambulatory Visit: Payer: 59 | Admitting: Family Medicine

## 2022-10-22 ENCOUNTER — Encounter: Payer: Self-pay | Admitting: Family Medicine

## 2022-10-22 VITALS — BP 128/62 | HR 62 | Temp 97.7°F | Ht 65.0 in | Wt 155.2 lb

## 2022-10-22 DIAGNOSIS — L601 Onycholysis: Secondary | ICD-10-CM

## 2022-10-22 NOTE — Patient Instructions (Signed)
Appears that you have some separation of the great toenail, possibly from previous injury but I do not see any sign of infection.  I expect the new toenail to grow in underneath that and the remaining toenail should fall off.  I am happy to refer you to podiatry to have that removed in a controlled manner if you would like.  Try to keep that area protected so that it does not get caught on sock, clothing that may cause it to pull off unexpectedly.  Watch for signs of infection but I do not see those at this time.  Can recheck that area at upcoming visit if needed but it may fall off during that time.  Take care

## 2022-10-22 NOTE — Progress Notes (Signed)
Subjective:  Patient ID: Alexis Hicks, female    DOB: 1960/10/03  Age: 62 y.o. MRN: [id number]  CC:  Chief Complaint  Patient presents with   Foot Pain    Pt notes her big toe of her Lt foot was bleeding and oozing aster a hike about 09/18/22 pt notes still painful, nail looks black, typically soaks her feet in listerine and has been using neosporin     HPI Alexis Hicks presents for   Left great toe issue: Noticed soreness, slight bleeding, oozing of fluid, walking on June 14 when in [place], hilly area. Increased walking.  Still some soreness, discoloration of nail.  Overall better.   Attempted treatments of soaks with Listerine and Neosporin topical.   History Patient Active Problem List   Diagnosis Date Noted   Skin lesion 12/02/2021   Benign mole 09/23/2021   DDD (degenerative disc disease), lumbar 01/09/2021   Right foot drop 01/09/2021   Other osteoporosis without current pathological fracture 01/09/2021   Osteopenia of multiple sites 08/25/2016   Vitamin D deficiency 08/25/2016   Fatigue 02/17/2016   Primary osteoarthritis of both feet 02/17/2016   DDD Cspine 02/17/2016   Osteoarthritis of lumbar spine 02/17/2016   Weakness of right lower extremity 02/17/2016   Migraine without status migrainosus, not intractable 02/17/2016   Raynaud's  02/17/2016   Sicca syndrome (HCC) 02/17/2016   DUB (dysfunctional uterine bleeding)    Fibromyalgia syndrome 09/10/2011   Primary osteoarthritis of both hands 09/10/2011   Scoliosis 09/10/2011   Past Medical History:  Diagnosis Date   Chronically dry eyes, right    Dry mouth    DUB (dysfunctional uterine bleeding)    Fibromyalgia    Osteoporosis 04/2018   Dr. Layne Benton   Pneumonia 04/2021   Raynaud's syndrome    Past Surgical History:  Procedure Laterality Date   MOLE REMOVAL  12/02/2021   face   SALPINGECTOMY     No Known Allergies Prior to Admission medications   Medication Sig Start Date End Date  Taking? Authorizing Provider  ascorbic Acid (VITAMIN C) 500 MG CPCR as needed.   Yes [provider]  benzonatate (TESSALON) 100 MG capsule Take 1 capsule (100 mg total) by mouth 3 (three) times daily as needed for cough. 09/28/22  Yes Shade Flood, MD  Biotin 1 MG CAPS Take by mouth.   Yes [provider]  Calcium Carbonate-Vitamin D (CALCIUM 600+D HIGH POTENCY PO) calcium   Yes [provider]  cholecalciferol (VITAMIN D) 1000 units tablet Take 2,000 Units by mouth daily.    Yes [provider]  Cyanocobalamin (VITAMIN B 12 PO) Take by mouth.   Yes [provider]  cycloSPORINE (RESTASIS) 0.05 % ophthalmic emulsion 1 drop daily.   Yes [provider]  fluticasone (FLONASE) 50 MCG/ACT nasal spray SPRAY 2 SPRAYS INTO EACH NOSTRIL EVERY DAY 02/17/22  Yes Shade Flood, MD  Omega-3 Fatty Acids (FISH OIL PO) Take by mouth daily.   Yes [provider]  risedronate (ACTONEL) 150 MG tablet Take 150 mg by mouth every 30 (thirty) days. 01/11/20  Yes [provider]  triamcinolone cream (KENALOG) 0.1 % triamcinolone acetonide 0.1 % topical cream  APPLY TO SKIN TWICE DAILY X 14 DAYS   Yes [provider]  TURMERIC PO Take by mouth daily.   Yes [provider]  Zinc 10 MG LOZG Use as directed in the mouth or throat as needed.   Yes [provider]  Social History   Socioeconomic History   Marital status: Married    Spouse name: Not on file   Number of children: Not on file   Years of education: Not on file   Highest education level: Not on file  Occupational History   Not on file  Tobacco Use   Smoking status: Never    Passive exposure: Past   Smokeless tobacco: Never  Vaping Use   Vaping status: Never Used  Substance and Sexual Activity   Alcohol use: Yes    Comment: occ   Drug use: No   Sexual activity: Not Currently  Other Topics Concern   Not on file  Social History Narrative    Not on file   Social Determinants of Health   Financial Resource Strain: Not on file  Food Insecurity: Not on file  Transportation Needs: Not on file  Physical Activity: Not on file  Stress: Not on file  Social Connections: Not on file  Intimate Partner Violence: Not on file    Review of Systems Per HPI  Objective:   Vitals:   10/22/22 1445  BP: 128/62  Pulse: 62  Temp: 97.7 F (36.5 C)  TempSrc: Temporal  SpO2: 97%  Weight: 155 lb 3.2 oz (70.4 kg)  Height: 5\' 5"  (1.651 m)     Physical Exam Constitutional:      General: She is not in acute distress.    Appearance: Normal appearance. She is well-developed.  HENT:     Head: Normocephalic and atraumatic.  Cardiovascular:     Rate and Rhythm: Normal rate.  Pulmonary:     Effort: Pulmonary effort is normal.  Musculoskeletal:     Comments: Left great toe, onycholysis with unattached discolored nail distally, still slightly attached to the proximal skin.  No surrounding erythema, discharge or bleeding.  Neurological:     Mental Status: She is alert and oriented to person, place, and time.  Psychiatric:        Mood and Affect: Mood normal.     Assessment & Plan:  Alexis Hicks is a 62 y.o. female . Onycholysis of toenail Probable traumatic subungual hematoma with subsequent onycholysis, no sign of infection, minimal discomfort.  Option of podiatry for controlled removal toenail, deferred at this time.  Please bandage or other precautions to minimize it catching on clothing, but should come off with new nail growth.  RTC precautions.  Has appointment with me soon, can recheck at that time.  Okay to refer to podiatry if requested in the interim.  No orders of the defined types were placed in this encounter.  Patient Instructions  Appears that you have some separation of the great toenail, possibly from previous injury but I do not see any sign of infection.  I expect the new toenail to grow in underneath that  and the remaining toenail should fall off.  I am happy to refer you to podiatry to have that removed in a controlled manner if you would like.  Try to keep that area protected so that it does not get caught on sock, clothing that may cause it to pull off unexpectedly.  Watch for signs of infection but I do not see those at this time.  Can recheck that area at upcoming visit if needed but it may fall off during that time.  Take care    Signed,   Meredith Staggers, MD [facility], [facility] 10/22/22 3:28 PM

## 2022-11-05 ENCOUNTER — Ambulatory Visit (INDEPENDENT_AMBULATORY_CARE_PROVIDER_SITE_OTHER): Payer: 59 | Admitting: Family Medicine

## 2022-11-05 ENCOUNTER — Encounter: Payer: Self-pay | Admitting: Family Medicine

## 2022-11-05 VITALS — BP 118/62 | HR 63 | Temp 97.9°F | Ht 64.0 in | Wt 153.6 lb

## 2022-11-05 DIAGNOSIS — R7303 Prediabetes: Secondary | ICD-10-CM

## 2022-11-05 DIAGNOSIS — Z0000 Encounter for general adult medical examination without abnormal findings: Secondary | ICD-10-CM | POA: Diagnosis not present

## 2022-11-05 DIAGNOSIS — E785 Hyperlipidemia, unspecified: Secondary | ICD-10-CM

## 2022-11-05 MED ORDER — FLUTICASONE PROPIONATE 50 MCG/ACT NA SUSP: 2.0000 | Freq: Every day | NASAL | 11 refills | Status: DC

## 2022-11-05 NOTE — Patient Instructions (Addendum)
Keep up the good work with exercise and watching diet.  Recheck labs at follow up in 6 months. Lab visit 1 week prior to appointment. Take care!  Preventive Care 30-64 Years Old, Female Preventive care refers to lifestyle choices and visits with your health care provider that can promote health and wellness. Preventive care visits are also called wellness exams. What can I expect for my preventive care visit? Counseling Your health care provider may ask you questions about your: Medical history, including: Past medical problems. Family medical history. Pregnancy history. Current health, including: Menstrual cycle. Method of birth control. Emotional well-being. Home life and relationship well-being. Sexual activity and sexual health. Lifestyle, including: Alcohol, nicotine or tobacco, and drug use. Access to firearms. Diet, exercise, and sleep habits. Work and work Astronomer. Sunscreen use. Safety issues such as seatbelt and bike helmet use. Physical exam Your health care provider will check your: Height and weight. These may be used to calculate your BMI (body mass index). BMI is a measurement that tells if you are at a healthy weight. Waist circumference. This measures the distance around your waistline. This measurement also tells if you are at a healthy weight and may help predict your risk of certain diseases, such as type 2 diabetes and high blood pressure. Heart rate and blood pressure. Body temperature. Skin for abnormal spots. What immunizations do I need?  Vaccines are usually given at various ages, according to a schedule. Your health care provider will recommend vaccines for you based on your age, medical history, and lifestyle or other factors, such as travel or where you work. What tests do I need? Screening Your health care provider may recommend screening tests for certain conditions. This may include: Lipid and cholesterol levels. Diabetes screening. This is  done by checking your blood sugar (glucose) after you have not eaten for a while (fasting). Pelvic exam and Pap test. Hepatitis B test. Hepatitis C test. HIV (human immunodeficiency virus) test. STI (sexually transmitted infection) testing, if you are at risk. Lung cancer screening. Colorectal cancer screening. Mammogram. Talk with your health care provider about when you should start having regular mammograms. This may depend on whether you have a family history of breast cancer. BRCA-related cancer screening. This may be done if you have a family history of breast, ovarian, tubal, or peritoneal cancers. Bone density scan. This is done to screen for osteoporosis. Talk with your health care provider about your test results, treatment options, and if necessary, the need for more tests. Follow these instructions at home: Eating and drinking  Eat a diet that includes fresh fruits and vegetables, whole grains, lean protein, and low-fat dairy products. Take vitamin and mineral supplements as recommended by your health care provider. Do not drink alcohol if: Your health care provider tells you not to drink. You are pregnant, may be pregnant, or are planning to become pregnant. If you drink alcohol: Limit how much you have to 0-1 drink a day. Know how much alcohol is in your drink. In the [place] one drink equals one 12 oz bottle of beer (355 mL), one 5 oz glass of wine (148 mL), or one 1 oz glass of hard liquor (44 mL). Lifestyle Brush your teeth every morning and night with fluoride toothpaste. Floss one time each day. Exercise for at least 30 minutes 5 or more days each week. Do not use any products that contain nicotine or tobacco. These products include cigarettes, chewing tobacco, and vaping devices, such as e-cigarettes. If you  need help quitting, ask your health care provider. Do not use drugs. If you are sexually active, practice safe sex. Use a condom or other form of protection to  prevent STIs. If you do not wish to become pregnant, use a form of birth control. If you plan to become pregnant, see your health care provider for a prepregnancy visit. Take aspirin only as told by your health care provider. Make sure that you understand how much to take and what form to take. Work with your health care provider to find out whether it is safe and beneficial for you to take aspirin daily. Find healthy ways to manage stress, such as: Meditation, yoga, or listening to music. Journaling. Talking to a trusted person. Spending time with friends and family. Minimize exposure to UV radiation to reduce your risk of skin cancer. Safety Always wear your seat belt while driving or riding in a vehicle. Do not drive: If you have been drinking alcohol. Do not ride with someone who has been drinking. When you are tired or distracted. While texting. If you have been using any mind-altering substances or drugs. Wear a helmet and other protective equipment during sports activities. If you have firearms in your house, make sure you follow all gun safety procedures. Seek help if you have been physically or sexually abused. What's next? Visit your health care provider once a year for an annual wellness visit. Ask your health care provider how often you should have your eyes and teeth checked. Stay up to date on all vaccines. This information is not intended to replace advice given to you by your health care provider. Make sure you discuss any questions you have with your health care provider. Document Revised: 09/18/2020 Document Reviewed: 09/18/2020 Elsevier Patient Education  2024 [employer].

## 2022-11-05 NOTE — Progress Notes (Signed)
Subjective:  Patient ID: Alexis Hicks, female    DOB: Jan 21, 1961  Age: 62 y.o. MRN: [id number]  CC:  Chief Complaint  Patient presents with   Annual Exam    Pt is doing well, discuss lab results, pt also has forms from work to be filled out     HPI Alexis Hicks presents for Annual Exam  PCP, me Rheumatology, Dr. Corliss Skains, Raynaud's, fibromyalgia, sicca syndrome Gynecology, Nigel Bridgeman, [facility], June 5th.  on Actonel prior - off since June while waiting on bone density results.  calcium, vitamin D for osteoporosis.   Dermatology, Dr. Sharyn Lull, history of eczema. Ophthalmology Dr. Santiago Bumpers, dry eye syndrome.  Treated with Restasis.  Avoids oral antihistamines for allergies d/t side effects. Daily flonase helps.  Flovent if needed for asthma.only used once - no recent asthma symptoms. Min episodic cough, allergen triggers.   Recently seen July 18 for traumatic onycholysis of great toenail, likely from preceding subungual hematoma.  Expectant management discussed with manage/covered to minimize catching nail.  Option of podiatry eval. Still attached, no pulling/pain/d/c. Plans to continue to monitor.   Screening labs July 9 in preparation for physical.  Hyperlipidemia: 10-year ASCVD risk score 3.5%.  Slight higher readings on recent labs but still with low risk score, continue diet/exercise approach. Lab Results  Component Value Date   CHOL 209 (H) 10/13/2022   HDL 53.00 10/13/2022   LDLCALC 139 (H) 10/13/2022   TRIG 86.0 10/13/2022   CHOLHDL 4 10/13/2022   Lab Results  Component Value Date   ALT 15 10/13/2022   AST 19 10/13/2022   ALKPHOS 105 10/13/2022   BILITOT 0.4 10/13/2022   Prediabetes: Stable A1c 3 weeks ago, previously 6.0 last year.  Diet/exercise approach, weight down a few pounds from last visit.  CMP, TSH normal in July. Plans on increased exercise in retirement and eating well. Some room for improvement.  Lab Results  Component  Value Date   HGBA1C 6.0 10/13/2022   Wt Readings from Last 3 Encounters:  11/05/22 153 lb 9.6 oz (69.7 kg)  10/22/22 155 lb 3.2 oz (70.4 kg)  05/26/22 156 lb 12.8 oz (71.1 kg)           11/05/2022    1:01 PM 09/28/2022    1:24 PM 05/22/2022    9:21 AM 09/18/2021    9:13 AM 04/23/2021    1:50 PM  Depression screen PHQ 2/9  Decreased Interest 0 0 0 0 0  Down, Depressed, Hopeless 0 0 0 0 0  PHQ - 2 Score 0 0 0 0 0  Altered sleeping 0 0 0    Tired, decreased energy 0 0 0    Change in appetite 0 0 0    Feeling bad or failure about yourself  0 0 0    Trouble concentrating 0 0 0    Moving slowly or fidgety/restless 0 0 0    Suicidal thoughts 0 0 0    PHQ-9 Score 0 0 0    Difficult doing work/chores   Not difficult at all      Health Maintenance  Topic Date Due   COVID-19 Vaccine (7 - 2023-24 season) 06/12/2022   PAP SMEAR-Modifier  06/23/2022   MAMMOGRAM  08/22/2022   INFLUENZA VACCINE  11/05/2022   Hepatitis C Screening  05/23/2023 (Originally 11/06/1978)   Colonoscopy  05/20/2030   DTaP/Tdap/Td (2 - Td or Tdap) 10/31/2031   Zoster Vaccines- Shingrix  Completed   HPV VACCINES  Aged Out  HIV Screening  Discontinued  Colonoscopy 2022, [facility], repeat 10 years. Mammogram at GYN, Pap testing  through GYN. June 5th for both and reports normal pap and mammogram, bone density planned in January - off Actonel since June. On for several years.   Immunization History  Administered Date(s) Administered   COVID-19, mRNA, vaccine(Comirnaty)12 years and older 04/17/2022   Influenza Inj Mdck Quad Pf 12/27/2021   Influenza-Unspecified 01/11/2019, 01/16/2021   PFIZER(Purple Top)SARS-COV-2 Vaccination 06/16/2019, 07/10/2019, 03/09/2020, 08/14/2020, 01/30/2021   Tdap 10/30/2021   Zoster Recombinant(Shingrix) 10/24/2020, 01/02/2021  Flu vaccine, updated COVID booster discussed when available, did have booster in January.  Tdap was updated last year.  No results found.  Regular  care with Optho given dry eyes as above.  Dental: Every 4 months. Had cracked tooth prior to trip - had crown.   Alcohol: rare - every 2-4 weeks typically.   Tobacco: none  Exercise: 20-30 min walking 6 days per week, free weights, plans to add Yoga.   History Patient Active Problem List   Diagnosis Date Noted   Skin lesion 12/02/2021   Benign mole 09/23/2021   DDD (degenerative disc disease), lumbar 01/09/2021   Right foot drop 01/09/2021   Other osteoporosis without current pathological fracture 01/09/2021   Osteopenia of multiple sites 08/25/2016   Vitamin D deficiency 08/25/2016   Fatigue 02/17/2016   Primary osteoarthritis of both feet 02/17/2016   DDD Cspine 02/17/2016   Osteoarthritis of lumbar spine 02/17/2016   Weakness of right lower extremity 02/17/2016   Migraine without status migrainosus, not intractable 02/17/2016   Raynaud's  02/17/2016   Sicca syndrome (HCC) 02/17/2016   DUB (dysfunctional uterine bleeding)    Fibromyalgia syndrome 09/10/2011   Primary osteoarthritis of both hands 09/10/2011   Scoliosis 09/10/2011   Past Medical History:  Diagnosis Date   Chronically dry eyes, right    Dry mouth    DUB (dysfunctional uterine bleeding)    Fibromyalgia    Osteoporosis 04/2018   Dr. Layne Benton   Pneumonia 04/2021   Raynaud's syndrome    Past Surgical History:  Procedure Laterality Date   MOLE REMOVAL  12/02/2021   face   SALPINGECTOMY     No Known Allergies Prior to Admission medications   Medication Sig Start Date End Date Taking? Authorizing Provider  ascorbic Acid (VITAMIN C) 500 MG CPCR as needed.   Yes [provider]  Biotin 1 MG CAPS Take by mouth.   Yes [provider]  Calcium Carbonate-Vitamin D (CALCIUM 600+D HIGH POTENCY PO) calcium   Yes [provider]  cholecalciferol (VITAMIN D) 1000 units tablet Take 2,000 Units by mouth daily.    Yes [provider]  Cyanocobalamin (VITAMIN B 12 PO) Take by  mouth.   Yes [provider]  cycloSPORINE (RESTASIS) 0.05 % ophthalmic emulsion 1 drop daily.   Yes [provider]  fluticasone (FLONASE) 50 MCG/ACT nasal spray SPRAY 2 SPRAYS INTO EACH NOSTRIL EVERY DAY 02/17/22  Yes Shade Flood, MD  Omega-3 Fatty Acids (FISH OIL PO) Take by mouth daily.   Yes [provider]  triamcinolone cream (KENALOG) 0.1 % triamcinolone acetonide 0.1 % topical cream  APPLY TO SKIN TWICE DAILY X 14 DAYS   Yes [provider]  TURMERIC PO Take by mouth daily.   Yes [provider]  Zinc 10 MG LOZG Use as directed in the mouth or throat as needed.   Yes [provider]  risedronate (ACTONEL) 150 MG tablet  Take 150 mg by mouth every 30 (thirty) days. Patient not taking: Reported on 11/05/2022 01/11/20   [provider]   Social History   Socioeconomic History   Marital status: Married    Spouse name: Not on file   Number of children: Not on file   Years of education: Not on file   Highest education level: Not on file  Occupational History   Not on file  Tobacco Use   Smoking status: Never    Passive exposure: Past   Smokeless tobacco: Never  Vaping Use   Vaping status: Never Used  Substance and Sexual Activity   Alcohol use: Yes    Comment: occ   Drug use: No   Sexual activity: Not Currently  Other Topics Concern   Not on file  Social History Narrative   Not on file   Social Determinants of Health   Financial Resource Strain: Not on file  Food Insecurity: Not on file  Transportation Needs: Not on file  Physical Activity: Not on file  Stress: Not on file  Social Connections: Not on file  Intimate Partner Violence: Not on file    Review of Systems 13 point review of systems per patient health survey noted.  Negative other than as indicated above or in HPI.    Objective:   Vitals:   11/05/22 1301  BP: 118/62  Pulse: 63  Temp: 97.9 F (36.6 C)  TempSrc: Temporal  SpO2: 96%   Weight: 153 lb 9.6 oz (69.7 kg)  Height: 5\' 4"  (1.626 m)     Physical Exam Vitals reviewed.  Constitutional:      Appearance: She is well-developed.  HENT:     Head: Normocephalic and atraumatic.     Right Ear: External ear normal.     Left Ear: External ear normal.  Eyes:     Conjunctiva/sclera: Conjunctivae normal.     Pupils: Pupils are equal, round, and reactive to light.  Neck:     Thyroid: No thyromegaly.  Cardiovascular:     Rate and Rhythm: Normal rate and regular rhythm.     Heart sounds: Normal heart sounds. No murmur heard. Pulmonary:     Effort: Pulmonary effort is normal. No respiratory distress.     Breath sounds: Normal breath sounds. No wheezing.  Abdominal:     General: Bowel sounds are normal.     Palpations: Abdomen is soft.     Tenderness: There is no abdominal tenderness.  Musculoskeletal:        General: No tenderness. Normal range of motion.     Cervical back: Normal range of motion and neck supple.  Lymphadenopathy:     Cervical: No cervical adenopathy.  Skin:    General: Skin is warm and dry.     Findings: No rash.  Neurological:     Mental Status: She is alert and oriented to person, place, and time.  Psychiatric:        Behavior: Behavior normal.        Thought Content: Thought content normal.        Assessment & Plan:  Alexis Hicks is a 62 y.o. female . Annual physical exam - Plan: fluticasone (FLONASE) 50 MCG/ACT nasal spray - -anticipatory guidance as below in AVS, screening labs above. Health maintenance items as above in HPI discussed/recommended as applicable.   Hyperlipidemia, unspecified hyperlipidemia type - Plan: Comprehensive metabolic panel, Lipid panel  -Continue diet/exercise approach, plans improved diet and exercise as above.  Commended on her  efforts.  Low ASCVD risk score, no new meds for now.  Recheck 6 months  Prediabetes - Plan: Hemoglobin A1c  -Stable, diet/exercise approach as above.  Continue  follow-up with specialists as planned with plan as above.  Meds ordered this encounter  Medications   fluticasone (FLONASE) 50 MCG/ACT nasal spray    Sig: Place 2 sprays into both nostrils daily.    Dispense:  48 mL    Refill:  11   Patient Instructions  Keep up the good work with exercise and watching diet.  Recheck labs at follow up in 6 months. Lab visit 1 week prior to appointment. Take care!  Preventive Care 59-108 Years Old, Female Preventive care refers to lifestyle choices and visits with your health care provider that can promote health and wellness. Preventive care visits are also called wellness exams. What can I expect for my preventive care visit? Counseling Your health care provider may ask you questions about your: Medical history, including: Past medical problems. Family medical history. Pregnancy history. Current health, including: Menstrual cycle. Method of birth control. Emotional well-being. Home life and relationship well-being. Sexual activity and sexual health. Lifestyle, including: Alcohol, nicotine or tobacco, and drug use. Access to firearms. Diet, exercise, and sleep habits. Work and work Astronomer. Sunscreen use. Safety issues such as seatbelt and bike helmet use. Physical exam Your health care provider will check your: Height and weight. These may be used to calculate your BMI (body mass index). BMI is a measurement that tells if you are at a healthy weight. Waist circumference. This measures the distance around your waistline. This measurement also tells if you are at a healthy weight and may help predict your risk of certain diseases, such as type 2 diabetes and high blood pressure. Heart rate and blood pressure. Body temperature. Skin for abnormal spots. What immunizations do I need?  Vaccines are usually given at various ages, according to a schedule. Your health care provider will recommend vaccines for you based on your age, medical  history, and lifestyle or other factors, such as travel or where you work. What tests do I need? Screening Your health care provider may recommend screening tests for certain conditions. This may include: Lipid and cholesterol levels. Diabetes screening. This is done by checking your blood sugar (glucose) after you have not eaten for a while (fasting). Pelvic exam and Pap test. Hepatitis B test. Hepatitis C test. HIV (human immunodeficiency virus) test. STI (sexually transmitted infection) testing, if you are at risk. Lung cancer screening. Colorectal cancer screening. Mammogram. Talk with your health care provider about when you should start having regular mammograms. This may depend on whether you have a family history of breast cancer. BRCA-related cancer screening. This may be done if you have a family history of breast, ovarian, tubal, or peritoneal cancers. Bone density scan. This is done to screen for osteoporosis. Talk with your health care provider about your test results, treatment options, and if necessary, the need for more tests. Follow these instructions at home: Eating and drinking  Eat a diet that includes fresh fruits and vegetables, whole grains, lean protein, and low-fat dairy products. Take vitamin and mineral supplements as recommended by your health care provider. Do not drink alcohol if: Your health care provider tells you not to drink. You are pregnant, may be pregnant, or are planning to become pregnant. If you drink alcohol: Limit how much you have to 0-1 drink a day. Know how much alcohol is in your drink. In  the [place] one drink equals one 12 oz bottle of beer (355 mL), one 5 oz glass of wine (148 mL), or one 1 oz glass of hard liquor (44 mL). Lifestyle Brush your teeth every morning and night with fluoride toothpaste. Floss one time each day. Exercise for at least 30 minutes 5 or more days each week. Do not use any products that contain nicotine or tobacco.  These products include cigarettes, chewing tobacco, and vaping devices, such as e-cigarettes. If you need help quitting, ask your health care provider. Do not use drugs. If you are sexually active, practice safe sex. Use a condom or other form of protection to prevent STIs. If you do not wish to become pregnant, use a form of birth control. If you plan to become pregnant, see your health care provider for a prepregnancy visit. Take aspirin only as told by your health care provider. Make sure that you understand how much to take and what form to take. Work with your health care provider to find out whether it is safe and beneficial for you to take aspirin daily. Find healthy ways to manage stress, such as: Meditation, yoga, or listening to music. Journaling. Talking to a trusted person. Spending time with friends and family. Minimize exposure to UV radiation to reduce your risk of skin cancer. Safety Always wear your seat belt while driving or riding in a vehicle. Do not drive: If you have been drinking alcohol. Do not ride with someone who has been drinking. When you are tired or distracted. While texting. If you have been using any mind-altering substances or drugs. Wear a helmet and other protective equipment during sports activities. If you have firearms in your house, make sure you follow all gun safety procedures. Seek help if you have been physically or sexually abused. What's next? Visit your health care provider once a year for an annual wellness visit. Ask your health care provider how often you should have your eyes and teeth checked. Stay up to date on all vaccines. This information is not intended to replace advice given to you by your health care provider. Make sure you discuss any questions you have with your health care provider. Document Revised: 09/18/2020 Document Reviewed: 09/18/2020 Elsevier Patient Education  2024 [employer]vier Inc.     Signed,   Meredith Staggers,  MD [facility], [facility] 11/05/22 1:37 PM

## 2022-11-16 ENCOUNTER — Telehealth: Payer: Self-pay | Admitting: Family Medicine

## 2022-11-16 NOTE — Telephone Encounter (Signed)
Caller name: Intisar Landin  On DPR?: Yes  Call back number: [phone] (home)  Provider they see: Shade Flood, MD  Reason for call:  Dropped off Health and Wellness Program Form   Charge sheet attached and placed in front bin

## 2022-11-16 NOTE — Telephone Encounter (Signed)
Called pt as this form was an already completed form was dropped off, turns out pt just wanted [place] to fax the form to the requested number, faxed and sent to scan

## 2022-11-17 NOTE — Progress Notes (Unsigned)
Office Visit Note  Patient: Alexis Hicks             Date of Birth: September 24, 1960           MRN: [id number]             PCP: Shade Flood, MD Referring: Shade Flood, MD Visit Date: 11/26/2022 Occupation: @GUAROCC @  Subjective:  Trochanteric bursitis  History of Present Illness: Alexis Hicks is a 62 y.o. female with history of osteoarthritis and DDD.  Patient reports that this past spring she traveled to [place] and increased her exercise regimen.  Patient states that she retired on 09/04/2022 and has been able to increase her exercise regimen.  She has been trying to walk 15 to 45 minutes daily and has been weightlifting twice a week.  Patient is also planning to start yoga at least once a week.  She has noticed some increased discomfort in both hips consistent with trochanteric bursitis.  She is hoping that yoga will help to alleviate her discomfort.  She continues to go to physical therapy once every 3 to 6 weeks depending on her schedule.  At physical therapy they usually help to alleviate her neck pain and stiffness as well as improve her balance.  She plans on increasing her stretching regimen.  She denies any joint swelling at this time.  Patient continues to take turmeric, ginger, tart cherry, and omega-3.  Activities of Daily Living:  Patient reports morning stiffness for 5 minutes.   Patient Reports nocturnal pain.  Difficulty dressing/grooming: Denies Difficulty climbing stairs: Denies Difficulty getting out of chair: Denies Difficulty using hands for taps, buttons, cutlery, and/or writing: Denies  Review of Systems  Constitutional:  Positive for fatigue.  HENT:  Positive for mouth dryness. Negative for mouth sores.   Eyes:  Positive for dryness.  Respiratory:  Positive for cough and wheezing. Negative for shortness of breath.   Cardiovascular:  Negative for chest pain and palpitations.  Gastrointestinal:  Negative for blood in stool, constipation and  diarrhea.  Endocrine: Negative for increased urination.  Genitourinary:  Negative for involuntary urination.  Musculoskeletal:  Positive for morning stiffness. Negative for joint pain, gait problem, joint pain, joint swelling, myalgias, muscle weakness, muscle tenderness and myalgias.  Skin:  Positive for rash. Negative for color change, hair loss and sensitivity to sunlight.  Allergic/Immunologic: Positive for susceptible to infections.  Neurological:  Negative for dizziness and headaches.  Hematological:  Negative for swollen glands.  Psychiatric/Behavioral:  Negative for depressed mood and sleep disturbance. The patient is not nervous/anxious.     PMFS History:  Patient Active Problem List   Diagnosis Date Noted   Skin lesion 12/02/2021   Benign mole 09/23/2021   DDD (degenerative disc disease), lumbar 01/09/2021   Right foot drop 01/09/2021   Other osteoporosis without current pathological fracture 01/09/2021   Osteopenia of multiple sites 08/25/2016   Vitamin D deficiency 08/25/2016   Fatigue 02/17/2016   Primary osteoarthritis of both feet 02/17/2016   DDD Cspine 02/17/2016   Osteoarthritis of lumbar spine 02/17/2016   Weakness of right lower extremity 02/17/2016   Migraine without status migrainosus, not intractable 02/17/2016   Raynaud's  02/17/2016   Sicca syndrome (HCC) 02/17/2016   DUB (dysfunctional uterine bleeding)    Fibromyalgia syndrome 09/10/2011   Primary osteoarthritis of both hands 09/10/2011   Scoliosis 09/10/2011    Past Medical History:  Diagnosis Date   Chronically dry eyes, right    Dry mouth  DUB (dysfunctional uterine bleeding)    Fibromyalgia    Osteoporosis 04/2018   Dr. Layne Benton   Pneumonia 04/2021   Raynaud's syndrome     Family History  Problem Relation Age of Onset   Arthritis Mother    Osteoporosis Mother    Dementia Mother    Skin cancer Father    Heart disease Father    Metabolic syndrome Brother    Diabetes Brother     Hypertension Brother    High Cholesterol Brother    Stroke Brother    Uterine cancer Maternal Aunt    Breast cancer Paternal Aunt    Depression Maternal Grandmother    Hypertension Maternal Grandmother    Stroke Maternal Grandmother    Kidney disease Maternal Grandfather    Breast cancer Paternal Grandmother    Past Surgical History:  Procedure Laterality Date   MOLE REMOVAL  12/02/2021   face   SALPINGECTOMY     Social History   Social History Narrative   Not on file   Immunization History  Administered Date(s) Administered   COVID-19, mRNA, vaccine(Comirnaty)12 years and older 04/17/2022   Influenza Inj Mdck Quad Pf 12/27/2021   Influenza-Unspecified 01/11/2019, 01/16/2021   PFIZER(Purple Top)SARS-COV-2 Vaccination 06/16/2019, 07/10/2019, 03/09/2020, 08/14/2020, 01/30/2021   Tdap 10/30/2021   Zoster Recombinant(Shingrix) 10/24/2020, 01/02/2021     Objective: Vital Signs: BP 109/71 (BP Location: Left Arm, Patient Position: Sitting, Cuff Size: Normal)   Pulse 71   Resp 15   Ht 5\' 4"  (1.626 m)   Wt 153 lb 6.4 oz (69.6 kg)   BMI 26.33 kg/m    Physical Exam Vitals and nursing note reviewed.  Constitutional:      Appearance: She is well-developed.  HENT:     Head: Normocephalic and atraumatic.  Eyes:     Conjunctiva/sclera: Conjunctivae normal.  Cardiovascular:     Rate and Rhythm: Normal rate and regular rhythm.     Heart sounds: Normal heart sounds.  Pulmonary:     Effort: Pulmonary effort is normal.     Breath sounds: Normal breath sounds.  Abdominal:     General: Bowel sounds are normal.     Palpations: Abdomen is soft.  Musculoskeletal:     Cervical back: Normal range of motion.  Lymphadenopathy:     Cervical: No cervical adenopathy.  Skin:    General: Skin is warm and dry.     Capillary Refill: Capillary refill takes less than 2 seconds.  Neurological:     Mental Status: She is alert and oriented to person, place, and time.  Psychiatric:         Behavior: Behavior normal.      Musculoskeletal Exam: C-spine has slightly limited range of motion with lateral rotation.  Thoracic kyphosis noted.  Shoulder joints, elbow joints, wrist joints, MCPs, PIPs, DIPs have good range of motion with no synovitis.  Some tenderness over bilateral first PIP joints but no active inflammation noted.  CMC, PIP, DIP thickening noted.  Hip joints have good range of motion with no groin pain.  Tenderness over the trochanteric bursa bilaterally, left greater than right.  Knee joints have good range of motion with no warmth or effusion.  Right ankle remains in a brace.  Left ankle has good range of motion with no tenderness or synovitis.  CDAI Exam: CDAI Score: -- Patient Global: --; Provider Global: -- Swollen: --; Tender: -- Joint Exam 11/26/2022   No joint exam has been documented for this visit   There is  currently no information documented on the homunculus. Go to the Rheumatology activity and complete the homunculus joint exam.  Investigation: No additional findings.  Imaging: No results found.  Recent Labs: Lab Results  Component Value Date   WBC 6.0 10/13/2022   HGB 14.2 10/13/2022   PLT 234.0 10/13/2022   NA 140 10/13/2022   K 3.9 10/13/2022   CL 105 10/13/2022   CO2 27 10/13/2022   GLUCOSE 87 10/13/2022   BUN 12 10/13/2022   CREATININE 0.81 10/13/2022   BILITOT 0.4 10/13/2022   ALKPHOS 105 10/13/2022   AST 19 10/13/2022   ALT 15 10/13/2022   PROT 7.1 10/13/2022   ALBUMIN 4.1 10/13/2022   CALCIUM 9.4 10/13/2022    Speciality Comments: Boniva 1 dose 03/21-SEs actonel 04/21  Procedures:  No procedures performed Allergies: Patient has no known allergies.     Assessment / Plan:     Visit Diagnoses: Primary osteoarthritis of both hands: CMC, PIP, DIP thickening consistent with osteoarthritis of both hands.  No synovitis or dactylitis noted. Complete fist formation noted bilaterally.  Some tenderness over bilateral first PIP  joints but no inflammation noted.  Discussed the importance of joint protection and muscle strengthening.  Patient was given a list of natural anti-inflammatories to start taking.  Primary osteoarthritis of both feet: Patient wears proper fitting shoes.    Sicca syndrome (HCC) - SPEP did not reveal any abnormal protein bands, ANA 1:140 Nuclear fine speckled, ESR 11, Vitamin D 51, Ro-, La-, RF<14, complements WNL - Plan: Sjogrens syndrome-A extractable nuclear antibody, Sjogrens syndrome-B extractable nuclear antibody  Right foot drop: Patient continues to wear an ankle brace and works on balance exercises.   DDD (degenerative disc disease), cervical: C-spine has limited range of motion.  Patient continues to go to physical therapy once every 3 to 6 weeks on a maintenance basis.  Other form of scoliosis of thoracolumbar spine  Spondylosis without myelopathy or radiculopathy, lumbar region: No symptoms of radiculopathy at this time.  DDD (degenerative disc disease), lumbar: No symptoms of radiculopathy.  Fibromyalgia syndrome: Patient retired on 09/04/2022 and has been able to increase her exercise regimen since then.  She has also been working on weight loss by making dietary changes.  She has been walking 15 to 45 minutes daily and has been weightlifting twice a week.  She is also planning on starting to practice yoga at least once a week.  She goes to physical therapy once every 3 to 6 weeks which helps to improve her neck pain and stiffness as well as improves her balance.   Discussed the importance of regular exercise and good sleep hygiene for management of fibromyalgia.     Trochanteric bursitis of both hips: Patient presents today with increased discomfort in both hips consistent with trochanteric bursitis.  She has some tenderness over bilateral trochanteric bursa, left greater than right.  Patient plans on starting to practice yoga.  Encouraged patient to perform daily stretching  exercises.  Other osteoporosis without current pathological fracture - Previously on Actonel 150 mg tablets every month-started in April 2021. DEXA updated on 08/21/2020: Left femoral neck BMD 0.551 with T score -2.7. DEXA scheduled 04/16/23.  She is taking calcium and vitamin D daily.    Vitamin D deficiency: She is taking vitamin D 2000 units daily.    Positive ANA (antinuclear antibody) -02/19/22: ANA 1: 40 nuclear, fine speckled, SPEP did not reveal any abnormal protein bands, RF negative, complements within normal limits, Ro and La antibodies  negative vitamin D within normal limits, sed rate within normal limits.   Future orders for the following labs were placed today to be drawn at her next appointment as requested. Plan: CBC with Differential/Platelet, COMPLETE METABOLIC PANEL WITH GFR, Protein / creatinine ratio, urine, ANA, C3 and C4, Anti-DNA antibody, double-stranded, Sedimentation rate, Sjogrens syndrome-A extractable nuclear antibody, Sjogrens syndrome-B extractable nuclear antibody  Other fatigue: She has increased her exercise regimen and has been walking 15 to 45 minutes daily as well as performing weightlifting at least twice a week.  History of migraine  Orders: Orders Placed This Encounter  Procedures   CBC with Differential/Platelet   COMPLETE METABOLIC PANEL WITH GFR   Protein / creatinine ratio, urine   ANA   C3 and C4   Anti-DNA antibody, double-stranded   Sedimentation rate   Sjogrens syndrome-A extractable nuclear antibody   Sjogrens syndrome-B extractable nuclear antibody   No orders of the defined types were placed in this encounter.   Follow-Up Instructions: Return in about 5 months (around 04/28/2023).   Gearldine Bienenstock, PA-C  Note - This record has been created using Dragon software.  Chart creation errors have been sought, but may not always  have been located. Such creation errors do not reflect on  the standard of medical care.

## 2022-11-26 ENCOUNTER — Ambulatory Visit: Payer: 59 | Attending: Rheumatology | Admitting: Physician Assistant

## 2022-11-26 ENCOUNTER — Encounter: Payer: Self-pay | Admitting: Physician Assistant

## 2022-11-26 VITALS — BP 109/71 | HR 71 | Resp 15 | Ht 64.0 in | Wt 153.4 lb

## 2022-11-26 DIAGNOSIS — M7062 Trochanteric bursitis, left hip: Secondary | ICD-10-CM

## 2022-11-26 DIAGNOSIS — Z8669 Personal history of other diseases of the nervous system and sense organs: Secondary | ICD-10-CM

## 2022-11-26 DIAGNOSIS — M3500 Sicca syndrome, unspecified: Secondary | ICD-10-CM | POA: Diagnosis not present

## 2022-11-26 DIAGNOSIS — M47816 Spondylosis without myelopathy or radiculopathy, lumbar region: Secondary | ICD-10-CM

## 2022-11-26 DIAGNOSIS — R5383 Other fatigue: Secondary | ICD-10-CM

## 2022-11-26 DIAGNOSIS — M19072 Primary osteoarthritis, left ankle and foot: Secondary | ICD-10-CM

## 2022-11-26 DIAGNOSIS — M19041 Primary osteoarthritis, right hand: Secondary | ICD-10-CM

## 2022-11-26 DIAGNOSIS — R768 Other specified abnormal immunological findings in serum: Secondary | ICD-10-CM

## 2022-11-26 DIAGNOSIS — M19042 Primary osteoarthritis, left hand: Secondary | ICD-10-CM

## 2022-11-26 DIAGNOSIS — M7061 Trochanteric bursitis, right hip: Secondary | ICD-10-CM

## 2022-11-26 DIAGNOSIS — M19071 Primary osteoarthritis, right ankle and foot: Secondary | ICD-10-CM | POA: Diagnosis not present

## 2022-11-26 DIAGNOSIS — M818 Other osteoporosis without current pathological fracture: Secondary | ICD-10-CM

## 2022-11-26 DIAGNOSIS — M5030 Other cervical disc degeneration, unspecified cervical region: Secondary | ICD-10-CM

## 2022-11-26 DIAGNOSIS — M21371 Foot drop, right foot: Secondary | ICD-10-CM | POA: Diagnosis not present

## 2022-11-26 DIAGNOSIS — M797 Fibromyalgia: Secondary | ICD-10-CM

## 2022-11-26 DIAGNOSIS — M5136 Other intervertebral disc degeneration, lumbar region: Secondary | ICD-10-CM

## 2022-11-26 DIAGNOSIS — M4185 Other forms of scoliosis, thoracolumbar region: Secondary | ICD-10-CM

## 2022-11-26 DIAGNOSIS — E559 Vitamin D deficiency, unspecified: Secondary | ICD-10-CM

## 2023-02-25 DIAGNOSIS — M1990 Unspecified osteoarthritis, unspecified site: Secondary | ICD-10-CM | POA: Insufficient documentation

## 2023-02-25 DIAGNOSIS — M899 Disorder of bone, unspecified: Secondary | ICD-10-CM | POA: Insufficient documentation

## 2023-02-26 ENCOUNTER — Ambulatory Visit: Payer: 59 | Admitting: Family Medicine

## 2023-02-26 ENCOUNTER — Encounter: Payer: Self-pay | Admitting: Family Medicine

## 2023-02-26 VITALS — BP 102/62 | HR 60 | Temp 97.8°F | Ht 64.0 in | Wt 154.4 lb

## 2023-02-26 DIAGNOSIS — M7061 Trochanteric bursitis, right hip: Secondary | ICD-10-CM

## 2023-02-26 DIAGNOSIS — M7062 Trochanteric bursitis, left hip: Secondary | ICD-10-CM | POA: Diagnosis not present

## 2023-02-26 DIAGNOSIS — K439 Ventral hernia without obstruction or gangrene: Secondary | ICD-10-CM | POA: Diagnosis not present

## 2023-02-26 MED ORDER — MELOXICAM 15 MG PO TABS: 15.0000 mg | ORAL_TABLET | Freq: Every day | ORAL | 0 refills | Status: DC

## 2023-02-26 NOTE — Progress Notes (Signed)
   Subjective:    Patient ID: Alexis Hicks, female    DOB: March 12, 1961, 62 y.o.   MRN: [id number]  HPI Abd pain- pt reports she is exercising 'a lot more' and she is not sure if that is related to her abd pain.  Sxs started 'a few weeks ago' and seemed to correlate with increased intensity of exercise.  Pt felt a 'knot' in L upper abdomen.  Pain is localized to area surrounding the knot.  TTP.  No N/V.  No apparent pattern to discomfort.  Discomfort was relieved yesterday by having BM.  Bilateral hip pain- pt reports lateral hip areas are TTP.  Difficult to lie on L side.  Sxs started while in [place] doing a lot of walking and stair climbing.     Review of Systems For ROS see HPI     Objective:   Physical Exam Vitals reviewed.  Constitutional:      General: She is not in acute distress.    Appearance: She is well-developed. She is not ill-appearing.  HENT:     Head: Normocephalic and atraumatic.  Abdominal:     General: There is no distension.     Palpations: Abdomen is soft.     Tenderness: There is abdominal tenderness (mild TTP over 1cm area of firmness just L of umbilicus, when abd muscles are tightened there seems to be a small muscle defect). There is no guarding or rebound.  Musculoskeletal:        General: Tenderness (TTP over bilateral trochanteric bursa, L>R) present.  Skin:    General: Skin is warm and dry.     Findings: No erythema or rash.  Neurological:     General: No focal deficit present.     Mental Status: She is alert and oriented to person, place, and time.  Psychiatric:        Mood and Affect: Mood normal.        Behavior: Behavior normal.        Thought Content: Thought content normal.           Assessment & Plan:  Ventral hernia- new.  Pt's sxs and PE are consistent w/ very small ventral hernia.  Pain will come and go.  Discussed that if pain is severe and constant she must go to the ER for evaluation b/c this may mean incarceration.  Also  reviewed to lie down and try and reduce small lump if painful.  Pt is not interested in surgical referral at this time but will let us know if that changes.  Trochanteric bursitis- new.  Bilateral.  Will start daily scheduled Meloxicam x7-10 days and then prn.  Encouraged ice.  Reviewed supportive care and red flags that should prompt return.  Pt expressed understanding and is in agreement w/ plan.

## 2023-02-26 NOTE — Patient Instructions (Signed)
Follow up as needed or as scheduled START the Meloxicam once daily for pain and inflammation- take w/ food AVOID Ibuprofen, Aleve, Motrin, etc while on the Meloxicam You CAN take Tylenol (Acetaminophen) as needed for additional pain relief ICE your hips- particularly after activity IF the hernia becomes more painful or bothersome- let us know so we can refer you to surgery Call with any questions or concerns Stay Safe!  Stay Healthy! Happy Holidays!!

## 2023-03-15 ENCOUNTER — Encounter: Payer: Self-pay | Admitting: Family Medicine

## 2023-03-15 ENCOUNTER — Ambulatory Visit: Payer: 59 | Admitting: Family Medicine

## 2023-03-15 VITALS — BP 126/70 | HR 61 | Temp 98.2°F | Ht 64.0 in | Wt 153.6 lb

## 2023-03-15 DIAGNOSIS — M7061 Trochanteric bursitis, right hip: Secondary | ICD-10-CM | POA: Diagnosis not present

## 2023-03-15 DIAGNOSIS — K436 Other and unspecified ventral hernia with obstruction, without gangrene: Secondary | ICD-10-CM

## 2023-03-15 DIAGNOSIS — M7062 Trochanteric bursitis, left hip: Secondary | ICD-10-CM

## 2023-03-15 NOTE — Patient Instructions (Signed)
I will order CT scan first to verify the hernia and then if that is present we will refer you to general surgery.  See information below on hernia precautions, avoid heavy lifting and straining for now.  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  I think physical therapy is reasonable and we will place that referral.  If not improving I would consider injection or meeting with orthopedics.  Keep me posted.  Return to the clinic or go to the nearest emergency room if any of your symptoms worsen or new symptoms occur.  Hip Bursitis  Hip bursitis is the swelling of one or more of the fluid-filled sacs (bursae) in the hip joint. The hip bursae absorb shocks and prevent bones from rubbing against each other. If a bursa becomes irritated, it can fill with extra fluid and become inflamed. Hip bursitis can cause mild to moderate pain, and symptoms often come and go over time. What are the causes? This condition results from increased friction between the hip bones and the tendons around the hip joint. This condition can happen if you: Overuse your hip muscles. Injure your hip. Have weak buttocks muscles. Have bone spurs. Have an infection. In some cases, the cause may not be known. What increases the risk? You are more likely to develop this condition if: You injured your hip previously or had hip surgery. You have a medical condition, such as arthritis, gout, diabetes, or thyroid disease. You have spine problems. You have one leg that is shorter than the other. You participate in athletic activities that include repetitive motion, like running. You participate in sports where there is a risk of injury or falling, such as football, martial arts, or skiing. What are the signs or symptoms? Symptoms may come and go, and they often include: Pain in the hip or groin area. Pain may get worse with movement. Tenderness and swelling  of the hip. In rare cases, the bursa may become infected. If this happens, you may get a fever, as well as warmth and redness in the hip area. How is this diagnosed? This condition may be diagnosed based on: Your symptoms. Your medical history. A physical exam. Imaging tests, such as: X-rays to check your bones. MRI or ultrasound to check your tendons and muscles. Bone scan. How is this treated? This condition is treated by resting, icing, applying pressure (compression), and raising (elevating) the injured area. This is called RICE treatment. In some cases, RICE treatment may not be enough to make your symptoms go away. Treatment may also include: Using crutches, a cane, or a walker to decrease the strain on your hip. Taking medicine to help with swelling and pain. Getting a shot of cortisone medicine near the affected area to reduce swelling and pain. Taking antibiotic medicines if there is an infection. Draining fluid out of the bursa to help relieve swelling and pain. Having surgery to remove a damaged or infected bursa. This is rare. Long-term treatment may include: Physical therapy exercises for strength and flexibility. Identifying the cause of your bursitis to prevent future episodes. Lifestyle changes, such as weight loss, to reduce the strain on the hip. Follow these instructions at home: Managing pain, stiffness, and swelling     If directed, put ice on the affected area. To do this: Put ice in a plastic bag. Place a towel between your skin and the bag. Leave the ice on for 20  minutes, 2-3 times a day. Remove the ice if your skin turns bright red. This is very important. If you cannot feel pain, heat, or cold, you have a greater risk of damage to the area. Elevate your hip as much as you can without feeling pain. To do this, put a pillow under your hips while you lie down. If directed, apply heat to the affected area as often as told by your health care provider. Use the  heat source that your health care provider recommends, such as a moist heat pack or a heating pad. Place a towel between your skin and the heat source. Leave the heat on for 20-30 minutes. Remove the heat if your skin turns bright red. This is especially important if you are unable to feel pain, heat, or cold. You may have a greater risk of getting burned. Activity Do not use your hip to support your body weight until your health care provider says that you can. Use crutches, a cane, or a walker as told by your health care provider. If the affected leg is one that you use to drive, ask your health care provider if it is safe to drive. Rest and protect your hip as much as possible until your pain and swelling get better. Return to your normal activities as told by your health care provider. Ask your health care provider what activities are safe for you. Do exercises as told by your health care provider. General instructions Take over-the-counter and prescription medicines only as told by your health care provider. Gently massage and stretch your injured area as often as is comfortable. Wear compression wraps only as told by your health care provider. If one of your legs is shorter than the other, get fitted for a shoe insert or orthotic. Your health care provider or physical therapist can tell you where to find these items and what size you need. Maintain a healthy weight. Follow instructions from your health care provider for weight control. These may include dietary restrictions. Keep all follow-up visits. This is important. How is this prevented? Exercise regularly or as told by your health care provider. Wear supportive footwear that is appropriate for your sport and daily activities. Warm up and stretch before being active. Cool down and stretch after being active. Take breaks regularly from repetitive activity. If an activity irritates your hip or causes pain, avoid the activity as much as  possible. Avoid sitting down for long periods at a time. Where to find more information [employer]: orthoinfo.aaos.org Contact a health care provider if: You have a fever. You develop new symptoms. You have trouble walking or doing everyday activities. You have pain that gets worse or does not get better with medicine. You develop red skin or a feeling of warmth in your hip area. Get help right away if: You cannot move your hip. You have severe pain. You cannot control the muscles in your feet. Summary Hip bursitis is the swelling of one or more of the fluid-filled sacs (bursae) in the hip joint. Hip bursitis can cause hip or groin pain, and symptoms often come and go over time. This condition is often treated by resting, icing, applying pressure (compression), and raising (elevating) the injured area. Other treatments may be needed. This information is not intended to replace advice given to you by your health care provider. Make sure you discuss any questions you have with your health care provider. Document Revised: 03/18/2021 Document Reviewed: 03/18/2021 Elsevier Patient  Education  2024 [employer]   Hernia, Adult     A hernia happens when an organ or tissue inside your body pushes out through a weak spot in the muscles of your belly. This makes a bulge. The bulge may be: In a scar from surgery. This type of bulge is called an incisional hernia. Near your belly button. This type is called an umbilical hernia. In your groin, which is the area where your leg meets your lower belly. This type is called an inguinal hernia. If you're female, this type could also be in your scrotum. In your upper thigh. This type is called a femoral hernia. Inside your belly. This type is called a hiatal hernia. It happens when your stomach slides above your diaphragm, which is the muscle between your belly and your chest. What are the causes? You may get a hernia  if: You lift heavy things. You cough over a long period of time. You have trouble pooping, also called constipation. Trouble pooping can lead to straining. There's a cut from surgery in your belly. You have a problem that's present at birth. There's fluid around your belly. You're female and have a testicle that hasn't moved down into your scrotum. You may be at greater risk for hernia if: You smoke. You're very overweight. What are the signs or symptoms? The main symptom is a bulge, but the bulge may not always be seen. It may grow bigger or be easier to see when you cough or strain, such as when you lift something heavy. If you can push the bulge back into your belly, it may not cause pain. If you can't push it back into your belly, it may lose its blood supply. This can cause: Pain. Fever. Nausea and vomiting. Swelling. Trouble pooping. How is this diagnosed? A hernia may be diagnosed based on your symptoms, medical history, and an exam. Your health care provider may ask you to cough or move in a way that makes the bulge easier to see. You may also have tests done. These may include: X-rays. Ultrasound. CT scan. How is this treated? A hernia that's small and painless may not need to be treated. A hernia that's large or painful may be treated with surgery. Surgery involves pushing the bulge back into place and fixing the weak area of the muscle or belly. Follow these instructions at home: Activity Try not to strain. You may have to avoid lifting. Ask how much weight you can safely lift. If you lift something heavy, use your leg muscles. Do not use your back muscles to lift. Prevent trouble pooping You may need to take these actions to prevent or treat trouble pooping: Drink enough fluid to keep your pee (urine) pale yellow. Take medicines to help you poop. Eat foods high in fiber. These include beans, whole grains, and fresh fruits and vegetables. General instructions When you  cough, try to cough gently. Try to push the bulge back in by very gently pressing on it when you're lying down. Do not try to force it back in if it won't push in easily. If you're overweight, work with your provider to lose weight safely. Do not smoke, vape, or use products with nicotine or tobacco in them. If you need help quitting, talk with your provider. If you're going to have surgery, watch your hernia for changes in shape, size, or color. Tell your provider about any changes. Contact a health care provider if: You get new pain, swelling, or  redness near your hernia. You have trouble pooping. Your poop is harder or larger than normal. You have a fever or chills. You have nausea or vomiting. Your hernia can't be pushed in. Get help right away if: You have belly pain that gets worse. Your hernia: Changes in shape or size. Changes color. Feels hard or hurts when you touch it. These symptoms may be an emergency. Call 911 right away. Do not wait to see if the symptoms will go away. Do not drive yourself to the hospital. This information is not intended to replace advice given to you by your health care provider. Make sure you discuss any questions you have with your health care provider. Document Revised: 06/16/2022 Document Reviewed: 06/16/2022 Elsevier Patient Education  2024 [employer].

## 2023-03-15 NOTE — Progress Notes (Signed)
Subjective:  Patient ID: Alexis Hicks, female    DOB: 02-11-1961  Age: 62 y.o. MRN: [id number]  CC:  Chief Complaint  Patient presents with   Hernia    Pt notes has a hernia notes she would like to discuss what this repair looks like and steps.    Bursitis    Bilateral hip pt would like referral to PT for management    Health Maintenance    Is patient eligible for pneumonia     HPI Alexis Hicks presents for   Ventral Hernia: Diagnosed by my colleague November 22.  Noticed after increased exercise previous few months. Symptoms and physical were consistent with a very small internal hernia with intermittent discomfort.  Declined surgical referral at that time but ER precautions were given.  No current pain, but sore every day at some point. Limited in activities to minimize chance of soreness in area.  No bowel changes, skin changes, n/v.   Trochanteric bursitis.  Diagnosed at 11/22 visit as well. Rx mobic. Ice.  Increased exercise since the spring. Traveled to [place], more exercise in June while there. Soreness in both hips in June.  Calmed down after return home.  Increased exercise further next few months. Pain started back - same pain.  Stretches helped - would like to try PT, and avoid taking more antiinflammatories. Min change with mobic. 1 advil per day worked better. Has been helping with ice as well. Has been seen by [facility] prior - would like ot go there. Overall better, but would like PT.   HM: pneumonia vaccine - plans at age 84. Immunization History  Administered Date(s) Administered   Influenza Inj Mdck Quad Pf 12/27/2021   Influenza, Mdck, Trivalent,PF 6+ MOS(egg free) 12/27/2022   Influenza-Unspecified 01/05/2015, 01/11/2019, 01/16/2021   PFIZER(Purple Top)SARS-COV-2 Vaccination 06/16/2019, 07/10/2019, 03/09/2020, 08/14/2020, 01/30/2021   Respiratory Syncytial Virus Vaccine,Recomb Aduvanted(Arexvy) 01/19/2023   Tdap 10/30/2021   Zoster  Recombinant(Shingrix) 10/24/2020, 01/02/2021     History Patient Active Problem List   Diagnosis Date Noted   Disorder of bone and articular cartilage 02/25/2023   Osteoarthritis 02/25/2023   Skin lesion 12/02/2021   Benign mole 09/23/2021   Right foot drop 01/09/2021   Other osteoporosis without current pathological fracture 01/09/2021   Osteopenia of multiple sites 08/25/2016   Vitamin D deficiency 08/25/2016   Fatigue 02/17/2016   Primary osteoarthritis of both feet 02/17/2016   Weakness of right lower extremity 02/17/2016   Migraine without status migrainosus, not intractable 02/17/2016   Raynaud's  02/17/2016   Sicca syndrome (HCC) 02/17/2016   DUB (dysfunctional uterine bleeding)    Fibromyalgia syndrome 09/10/2011   Primary osteoarthritis of both hands 09/10/2011   Scoliosis 09/10/2011   Past Medical History:  Diagnosis Date   Chronically dry eyes, right    Dry mouth    DUB (dysfunctional uterine bleeding)    Fibromyalgia    Osteoporosis 04/2018   Dr. Layne Benton   Pneumonia 04/2021   Raynaud's syndrome    Past Surgical History:  Procedure Laterality Date   MOLE REMOVAL  12/02/2021   face   SALPINGECTOMY     No Known Allergies Prior to Admission medications   Medication Sig Start Date End Date Taking? Authorizing Provider  ascorbic Acid (VITAMIN C) 500 MG CPCR as needed.   Yes [provider]  Biotin 1 MG CAPS Take by mouth.   Yes [provider]  Calcium Carbonate-Vitamin D (CALCIUM 600+D HIGH POTENCY PO) calcium   Yes [provider]  cholecalciferol (VITAMIN D) 1000 units tablet Take 2,000 Units by mouth daily.    Yes [provider]  Cyanocobalamin (VITAMIN B 12 PO) Take by mouth.   Yes [provider]  cycloSPORINE (RESTASIS) 0.05 % ophthalmic emulsion 1 drop daily.   Yes [provider]  fluticasone (FLONASE) 50 MCG/ACT nasal spray Place 2 sprays into both nostrils daily. 11/05/22  Yes Shade Flood,  MD  LYSINE PO Take by mouth daily.   Yes [provider]  meloxicam (MOBIC) 15 MG tablet Take 1 tablet (15 mg total) by mouth daily. 02/26/23  Yes Sheliah Hatch, MD  Omega-3 Fatty Acids (FISH OIL PO) Take by mouth daily.   Yes [provider]  risedronate (ACTONEL) 150 MG tablet Take 150 mg by mouth every 30 (thirty) days. 01/11/20  Yes [provider]  triamcinolone cream (KENALOG) 0.1 % triamcinolone acetonide 0.1 % topical cream  APPLY TO SKIN TWICE DAILY X 14 DAYS   Yes [provider]  TURMERIC PO Take by mouth daily.   Yes [provider]  Zinc 10 MG LOZG Use as directed in the mouth or throat as needed.   Yes [provider]   Social History   Socioeconomic History   Marital status: Married    Spouse name: Not on file   Number of children: Not on file   Years of education: Not on file   Highest education level: Not on file  Occupational History   Not on file  Tobacco Use   Smoking status: Never    Passive exposure: Past   Smokeless tobacco: Never  Vaping Use   Vaping status: Never Used  Substance and Sexual Activity   Alcohol use: Yes    Comment: occ   Drug use: No   Sexual activity: Not Currently  Other Topics Concern   Not on file  Social History Narrative   Not on file   Social Determinants of Health   Financial Resource Strain: Not on file  Food Insecurity: Not on file  Transportation Needs: Not on file  Physical Activity: Not on file  Stress: Not on file  Social Connections: Not on file  Intimate Partner Violence: Not on file    Review of Systems   Objective:   Vitals:   03/15/23 1346  BP: 126/70  Pulse: 61  Temp: 98.2 F (36.8 C)  TempSrc: Temporal  SpO2: 99%  Weight: 153 lb 9.6 oz (69.7 kg)  Height: 5\' 4"  (1.626 m)     Physical Exam Constitutional:      General: She is not in acute distress.    Appearance: Normal appearance. She is well-developed.  HENT:     Head:  Normocephalic and atraumatic.  Cardiovascular:     Rate and Rhythm: Normal rate.  Pulmonary:     Effort: Pulmonary effort is normal.  Abdominal:     Tenderness: There is abdominal tenderness.    Musculoskeletal:     Comments: Bilateral hip exam, pain-free internal and external rotation, negative seated straight leg raise bilaterally.  Tender to palpation over the left greater than right trochanteric bursa.  Neurological:     Mental Status: She is alert and oriented to person, place, and time.  Psychiatric:        Mood and Affect: Mood normal.        Assessment & Plan:  Alexis Hicks is a 62 y.o. female . Ventral hernia with bowel obstruction - Plan: CT ABDOMEN PELVIS  WO CONTRAST  -Suspected small ventral hernia, but slightly left of midline.  Check CT abdomen/pelvis to clarify and confirm if this is hernia and then would likely refer to general surgery given continued symptoms, hernia precautions given with handout, avoid heavy lifting/straining for now with ER precautions given.  Trochanteric bursitis of both hips - Plan: Ambulatory referral to Physical Therapy Left.  Right with some improvement.  Reproducible on exam.  I think physical therapy would be reasonable for now she has improved some with home stretching.  Defers injection at this time.  Consider injection or Ortho eval if persistent discomfort.  RTC precautions.  No orders of the defined types were placed in this encounter.  Patient Instructions  I will order CT scan first to verify the hernia and then if that is present we will refer you to general surgery.  See information below on hernia precautions, avoid heavy lifting and straining for now.  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  I think physical therapy is reasonable and we will place that referral.  If not improving I would consider injection or meeting with orthopedics.  Keep me  posted.  Return to the clinic or go to the nearest emergency room if any of your symptoms worsen or new symptoms occur.  Hip Bursitis  Hip bursitis is the swelling of one or more of the fluid-filled sacs (bursae) in the hip joint. The hip bursae absorb shocks and prevent bones from rubbing against each other. If a bursa becomes irritated, it can fill with extra fluid and become inflamed. Hip bursitis can cause mild to moderate pain, and symptoms often come and go over time. What are the causes? This condition results from increased friction between the hip bones and the tendons around the hip joint. This condition can happen if you: Overuse your hip muscles. Injure your hip. Have weak buttocks muscles. Have bone spurs. Have an infection. In some cases, the cause may not be known. What increases the risk? You are more likely to develop this condition if: You injured your hip previously or had hip surgery. You have a medical condition, such as arthritis, gout, diabetes, or thyroid disease. You have spine problems. You have one leg that is shorter than the other. You participate in athletic activities that include repetitive motion, like running. You participate in sports where there is a risk of injury or falling, such as football, martial arts, or skiing. What are the signs or symptoms? Symptoms may come and go, and they often include: Pain in the hip or groin area. Pain may get worse with movement. Tenderness and swelling of the hip. In rare cases, the bursa may become infected. If this happens, you may get a fever, as well as warmth and redness in the hip area. How is this diagnosed? This condition may be diagnosed based on: Your symptoms. Your medical history. A physical exam. Imaging tests, such as: X-rays to check your bones. MRI or ultrasound to check your tendons and muscles. Bone scan. How is this treated? This condition is treated by resting, icing, applying pressure  (compression), and raising (elevating) the injured area. This is called RICE treatment. In some cases, RICE treatment may not be enough to make your symptoms go away. Treatment may also include: Using crutches, a cane, or a walker to decrease the strain on your hip. Taking medicine to help with swelling and pain. Getting a shot  of cortisone medicine near the affected area to reduce swelling and pain. Taking antibiotic medicines if there is an infection. Draining fluid out of the bursa to help relieve swelling and pain. Having surgery to remove a damaged or infected bursa. This is rare. Long-term treatment may include: Physical therapy exercises for strength and flexibility. Identifying the cause of your bursitis to prevent future episodes. Lifestyle changes, such as weight loss, to reduce the strain on the hip. Follow these instructions at home: Managing pain, stiffness, and swelling     If directed, put ice on the affected area. To do this: Put ice in a plastic bag. Place a towel between your skin and the bag. Leave the ice on for 20 minutes, 2-3 times a day. Remove the ice if your skin turns bright red. This is very important. If you cannot feel pain, heat, or cold, you have a greater risk of damage to the area. Elevate your hip as much as you can without feeling pain. To do this, put a pillow under your hips while you lie down. If directed, apply heat to the affected area as often as told by your health care provider. Use the heat source that your health care provider recommends, such as a moist heat pack or a heating pad. Place a towel between your skin and the heat source. Leave the heat on for 20-30 minutes. Remove the heat if your skin turns bright red. This is especially important if you are unable to feel pain, heat, or cold. You may have a greater risk of getting burned. Activity Do not use your hip to support your body weight until your health care provider says that you can.  Use crutches, a cane, or a walker as told by your health care provider. If the affected leg is one that you use to drive, ask your health care provider if it is safe to drive. Rest and protect your hip as much as possible until your pain and swelling get better. Return to your normal activities as told by your health care provider. Ask your health care provider what activities are safe for you. Do exercises as told by your health care provider. General instructions Take over-the-counter and prescription medicines only as told by your health care provider. Gently massage and stretch your injured area as often as is comfortable. Wear compression wraps only as told by your health care provider. If one of your legs is shorter than the other, get fitted for a shoe insert or orthotic. Your health care provider or physical therapist can tell you where to find these items and what size you need. Maintain a healthy weight. Follow instructions from your health care provider for weight control. These may include dietary restrictions. Keep all follow-up visits. This is important. How is this prevented? Exercise regularly or as told by your health care provider. Wear supportive footwear that is appropriate for your sport and daily activities. Warm up and stretch before being active. Cool down and stretch after being active. Take breaks regularly from repetitive activity. If an activity irritates your hip or causes pain, avoid the activity as much as possible. Avoid sitting down for long periods at a time. Where to find more information American Academy [employer]: orthoinfo.aaos.org Contact a health care provider if: You have a fever. You develop new symptoms. You have trouble walking or doing everyday activities. You have pain that gets worse or does not get better with medicine. You develop red skin or a  feeling of warmth in your hip area. Get help right away if: You cannot move your  hip. You have severe pain. You cannot control the muscles in your feet. Summary Hip bursitis is the swelling of one or more of the fluid-filled sacs (bursae) in the hip joint. Hip bursitis can cause hip or groin pain, and symptoms often come and go over time. This condition is often treated by resting, icing, applying pressure (compression), and raising (elevating) the injured area. Other treatments may be needed. This information is not intended to replace advice given to you by your health care provider. Make sure you discuss any questions you have with your health care provider. Document Revised: 03/18/2021 Document Reviewed: 03/18/2021 Elsevier Patient Education  2024 Elsevier Inc.   Hernia, Adult     A hernia happens when an organ or tissue inside your body pushes out through a weak spot in the muscles of your belly. This makes a bulge. The bulge may be: In a scar from surgery. This type of bulge is called an incisional hernia. Near your belly button. This type is called an umbilical hernia. In your groin, which is the area where your leg meets your lower belly. This type is called an inguinal hernia. If you're female, this type could also be in your scrotum. In your upper thigh. This type is called a femoral hernia. Inside your belly. This type is called a hiatal hernia. It happens when your stomach slides above your diaphragm, which is the muscle between your belly and your chest. What are the causes? You may get a hernia if: You lift heavy things. You cough over a long period of time. You have trouble pooping, also called constipation. Trouble pooping can lead to straining. There's a cut from surgery in your belly. You have a problem that's present at birth. There's fluid around your belly. You're female and have a testicle that hasn't moved down into your scrotum. You may be at greater risk for hernia if: You smoke. You're very overweight. What are the signs or symptoms? The  main symptom is a bulge, but the bulge may not always be seen. It may grow bigger or be easier to see when you cough or strain, such as when you lift something heavy. If you can push the bulge back into your belly, it may not cause pain. If you can't push it back into your belly, it may lose its blood supply. This can cause: Pain. Fever. Nausea and vomiting. Swelling. Trouble pooping. How is this diagnosed? A hernia may be diagnosed based on your symptoms, medical history, and an exam. Your health care provider may ask you to cough or move in a way that makes the bulge easier to see. You may also have tests done. These may include: X-rays. Ultrasound. CT scan. How is this treated? A hernia that's small and painless may not need to be treated. A hernia that's large or painful may be treated with surgery. Surgery involves pushing the bulge back into place and fixing the weak area of the muscle or belly. Follow these instructions at home: Activity Try not to strain. You may have to avoid lifting. Ask how much weight you can safely lift. If you lift something heavy, use your leg muscles. Do not use your back muscles to lift. Prevent trouble pooping You may need to take these actions to prevent or treat trouble pooping: Drink enough fluid to keep your pee (urine) pale yellow. Take medicines to help you  poop. Eat foods high in fiber. These include beans, whole grains, and fresh fruits and vegetables. General instructions When you cough, try to cough gently. Try to push the bulge back in by very gently pressing on it when you're lying down. Do not try to force it back in if it won't push in easily. If you're overweight, work with your provider to lose weight safely. Do not smoke, vape, or use products with nicotine or tobacco in them. If you need help quitting, talk with your provider. If you're going to have surgery, watch your hernia for changes in shape, size, or color. Tell your provider  about any changes. Contact a health care provider if: You get new pain, swelling, or redness near your hernia. You have trouble pooping. Your poop is harder or larger than normal. You have a fever or chills. You have nausea or vomiting. Your hernia can't be pushed in. Get help right away if: You have belly pain that gets worse. Your hernia: Changes in shape or size. Changes color. Feels hard or hurts when you touch it. These symptoms may be an emergency. Call 911 right away. Do not wait to see if the symptoms will go away. Do not drive yourself to the hospital. This information is not intended to replace advice given to you by your health care provider. Make sure you discuss any questions you have with your health care provider. Document Revised: 06/16/2022 Document Reviewed: 06/16/2022 Elsevier Patient Education  2024 [employer] Inc.     Signed,   Meredith Staggers, MD [facility], [facility] 03/15/23 2:46 PM

## 2023-03-26 ENCOUNTER — Other Ambulatory Visit: Payer: 59

## 2023-04-06 ENCOUNTER — Ambulatory Visit
Admission: RE | Admit: 2023-04-06 | Discharge: 2023-04-06 | Disposition: A | Discharge status: Home / Self Care | Payer: 59 | Source: Ambulatory Visit | Attending: Family Medicine | Admitting: Family Medicine

## 2023-04-06 ENCOUNTER — Other Ambulatory Visit: Payer: 59

## 2023-04-06 DIAGNOSIS — K436 Other and unspecified ventral hernia with obstruction, without gangrene: Secondary | ICD-10-CM

## 2023-04-15 ENCOUNTER — Ambulatory Visit: Payer: 59 | Admitting: Family Medicine

## 2023-04-15 ENCOUNTER — Encounter: Payer: Self-pay | Admitting: Family Medicine

## 2023-04-15 VITALS — BP 122/74 | HR 66 | Temp 98.8°F | Ht 64.0 in | Wt 153.6 lb

## 2023-04-15 DIAGNOSIS — K4090 Unilateral inguinal hernia, without obstruction or gangrene, not specified as recurrent: Secondary | ICD-10-CM

## 2023-04-15 DIAGNOSIS — R932 Abnormal findings on diagnostic imaging of liver and biliary tract: Secondary | ICD-10-CM

## 2023-04-15 DIAGNOSIS — K429 Umbilical hernia without obstruction or gangrene: Secondary | ICD-10-CM

## 2023-04-15 DIAGNOSIS — R911 Solitary pulmonary nodule: Secondary | ICD-10-CM

## 2023-04-15 NOTE — Progress Notes (Signed)
 Subjective:  Patient ID: Alexis Hicks, female    DOB: 1961/03/03  Age: 63 y.o. MRN: [id number]  CC:  Chief Complaint  Patient presents with   Results    Pt wants to know if hernia repair can wait, request to discuss liver tests and see if these are the same, wants to know if her next lung scan should get scheduled or if she should just go see pulmonary, initially was here for pain on the Lt side of the abdomen and nothing was seen there she wants to know how to proceed with that concern     HPI Alexis Hicks presents for   Follow-up labs, and studies for previous left abdominal pain. Last visit December 9.  Had been diagnosed with a ventral hernia by my colleague previously.  CT scan ordered to clarify and confirm hernia, then to decide on general surgery eval.  She was having significant continued soreness at that area daily at some point, and had adjusted her activities to minimize chance of soreness in that area.  However no bowel changes, skin changes or nausea/vomiting.   CT abdomen/pelvis completed on 04/06/2023.  No acute abnormality.  Small fat containing umbilical hernia. Small fat containing right inguinal hernia. No abnormality of the left upper quadrant. No hernia/mass was identified in the anterior abdominal wall to explain symptoms.  Some nodular hepatic contour suspicious for cirrhosis, and 12 mm nodular opacity in the right medial lung base with planned 73-month recheck with either repeat chest CT, PET/CT or tissue sampling. Last LFTs in July 2024 normal.  Has been doing some exercises for bursitis. Abdominal pain has improved. No n/v/fever/scleral icterus or jaundice. Normal BM's, normal urination. Upcoming labs at end of month.  Min alcohol - 1 per week or two.  No new cough. Some daily cough at times - no recent changes, on flonase  for allergies.  No unexplained weight loss, no hemoptysis.  No recent tobacco, 2-5 cigs per day for few years - 20 yrs ago. Some  second hand smoke at times.    History Patient Active Problem List   Diagnosis Date Noted   Disorder of bone and articular cartilage 02/25/2023   Osteoarthritis 02/25/2023   Skin lesion 12/02/2021   Benign mole 09/23/2021   Right foot drop 01/09/2021   Other osteoporosis without current pathological fracture 01/09/2021   Osteopenia of multiple sites 08/25/2016   Vitamin D  deficiency 08/25/2016   Fatigue 02/17/2016   Primary osteoarthritis of both feet 02/17/2016   Weakness of right lower extremity 02/17/2016   Migraine without status migrainosus, not intractable 02/17/2016   Raynaud's  02/17/2016   Sicca syndrome (HCC) 02/17/2016   DUB (dysfunctional uterine bleeding)    Fibromyalgia syndrome 09/10/2011   Primary osteoarthritis of both hands 09/10/2011   Scoliosis 09/10/2011   Past Medical History:  Diagnosis Date   Chronically dry eyes, right    Dry mouth    DUB (dysfunctional uterine bleeding)    Fibromyalgia    Osteoporosis 04/2018   Dr. Norine   Pneumonia 04/2021   Raynaud's syndrome    Past Surgical History:  Procedure Laterality Date   MOLE REMOVAL  12/02/2021   face   SALPINGECTOMY     No Known Allergies Prior to Admission medications   Medication Sig Start Date End Date Taking? Authorizing Provider  ascorbic Acid (VITAMIN C) 500 MG CPCR as needed.   Yes [provider]  Biotin 1 MG CAPS Take by mouth.   Yes [provider]  Calcium Carbonate-Vitamin D  (CALCIUM 600+D HIGH POTENCY PO) calcium   Yes [provider]  cholecalciferol (VITAMIN D ) 1000 units tablet Take 2,000 Units by mouth daily.    Yes [provider]  Cyanocobalamin (VITAMIN B 12 PO) Take by mouth.   Yes [provider]  cycloSPORINE  (RESTASIS ) 0.05 % ophthalmic emulsion 1 drop daily.   Yes [provider]  fluticasone  (FLONASE ) 50 MCG/ACT nasal spray Place 2 sprays into both nostrils daily. 11/05/22  Yes Levora Reyes SAUNDERS, MD  LYSINE PO Take  by mouth daily.   Yes [provider]  meloxicam  (MOBIC ) 15 MG tablet Take 1 tablet (15 mg total) by mouth daily. 02/26/23  Yes Tabori, Katherine E, MD  Omega-3 Fatty Acids (FISH OIL PO) Take by mouth daily.   Yes [provider]  risedronate (ACTONEL) 150 MG tablet Take 150 mg by mouth every 30 (thirty) days. 01/11/20  Yes [provider]  triamcinolone cream (KENALOG) 0.1 % triamcinolone acetonide 0.1 % topical cream  APPLY TO SKIN TWICE DAILY X 14 DAYS   Yes [provider]  TURMERIC PO Take by mouth daily.   Yes [provider]  Zinc 10 MG LOZG Use as directed in the mouth or throat as needed.   Yes [provider]   Social History   Socioeconomic History   Marital status: Married    Spouse name: Not on file   Number of children: Not on file   Years of education: Not on file   Highest education level: Not on file  Occupational History   Not on file  Tobacco Use   Smoking status: Never    Passive exposure: Past   Smokeless tobacco: Never  Vaping Use   Vaping status: Never Used  Substance and Sexual Activity   Alcohol use: Yes    Comment: occ   Drug use: No   Sexual activity: Not Currently  Other Topics Concern   Not on file  Social History Narrative   Not on file   Social Drivers of Health   Financial Resource Strain: Not on file  Food Insecurity: Not on file  Transportation Needs: Not on file  Physical Activity: Not on file  Stress: Not on file  Social Connections: Not on file  Intimate Partner Violence: Not on file    Review of Systems   Objective:   Vitals:   04/15/23 1140  BP: 122/74  Pulse: 66  Temp: 98.8 F (37.1 C)  TempSrc: Temporal  SpO2: 96%  Weight: 153 lb 9.6 oz (69.7 kg)  Height: 5' 4 (1.626 m)     Physical Exam Vitals reviewed.  Constitutional:      Appearance: Normal appearance. She is well-developed.  HENT:     Head: Normocephalic and atraumatic.  Eyes:      Conjunctiva/sclera: Conjunctivae normal.     Pupils: Pupils are equal, round, and reactive to light.  Neck:     Vascular: No carotid bruit.  Cardiovascular:     Rate and Rhythm: Normal rate and regular rhythm.     Heart sounds: Normal heart sounds.  Pulmonary:     Effort: Pulmonary effort is normal.     Breath sounds: Normal breath sounds. No stridor. No wheezing or rhonchi.  Abdominal:     General: There is no distension.     Palpations: Abdomen is soft. There is no mass or pulsatile mass.     Tenderness: There is no abdominal tenderness. There is no guarding.  Musculoskeletal:  Right lower leg: No edema.     Left lower leg: No edema.  Skin:    General: Skin is warm and dry.  Neurological:     Mental Status: She is alert and oriented to person, place, and time.  Psychiatric:        Mood and Affect: Mood normal.        Behavior: Behavior normal.        Assessment & Plan:  Giavonni Cizek is a 63 y.o. female . Unilateral inguinal hernia without obstruction or gangrene, recurrence not specified - Plan: Ambulatory referral to General Surgery Umbilical hernia without obstruction and without gangrene - Plan: Ambulatory referral to General Surgery  -Previous left sided abdominal pain has improved, possible muscular cause, continue to monitor.  She did have 2 small hernias, umbilical and right inguinal, and would like to meet with general surgery to discuss continued monitoring or treatment options.  Referral placed.  Asymptomatic at this time.  Lung nodule - Plan: Ambulatory referral to Pulmonology  -12 mm nodule noted, including recommended follow-up testing or tissue sampling.  Will refer to pulmonary to discuss these options with patient and plan accordingly.  Abnormal finding on imaging of liver  -Possible cirrhosis on imaging, has LFTs pending with upcoming lab work.  Will review those first and discuss this further at her follow-up with me next month.  Consider  hepatology eval.  No orders of the defined types were placed in this encounter.  Patient Instructions  Glad to hear that the abdominal symptoms have improved, that suggest you could have had a muscle strain.  I will refer you to a general surgeon to discuss the umbilical hernia and a right sided inguinal hernia.  For the possible cirrhosis lets look at the liver tests first and depending on what those show then can consider meeting with a hepatologist or liver specialist to decide if other testing needed.    I will refer you to lung specialist to discuss options for the nodule seen on imaging and further testing needed.   Let me know if there are questions and hang in there.     Signed,   Reyes Pines, MD [facility] 04/15/23 12:41 PM

## 2023-04-15 NOTE — Patient Instructions (Addendum)
 Glad to hear that the abdominal symptoms have improved, that suggest you could have had a muscle strain.  I will refer you to a general surgeon to discuss the umbilical hernia and a right sided inguinal hernia.  For the possible cirrhosis lets look at the liver tests first and depending on what those show then can consider meeting with a hepatologist or liver specialist to decide if other testing needed.    I will refer you to lung specialist to discuss options for the nodule seen on imaging and further testing needed.   Let me know if there are questions and hang in there.

## 2023-04-16 ENCOUNTER — Ambulatory Visit
Admission: RE | Admit: 2023-04-16 | Discharge: 2023-04-16 | Disposition: A | Discharge status: Home / Self Care | Payer: 59 | Source: Ambulatory Visit | Attending: Obstetrics and Gynecology | Admitting: Obstetrics and Gynecology

## 2023-04-16 DIAGNOSIS — M810 Age-related osteoporosis without current pathological fracture: Secondary | ICD-10-CM

## 2023-04-28 ENCOUNTER — Ambulatory Visit: Payer: 59 | Admitting: Rheumatology

## 2023-05-04 ENCOUNTER — Other Ambulatory Visit (INDEPENDENT_AMBULATORY_CARE_PROVIDER_SITE_OTHER): Payer: 59

## 2023-05-04 DIAGNOSIS — R768 Other specified abnormal immunological findings in serum: Secondary | ICD-10-CM

## 2023-05-04 DIAGNOSIS — R7303 Prediabetes: Secondary | ICD-10-CM | POA: Diagnosis not present

## 2023-05-04 DIAGNOSIS — E785 Hyperlipidemia, unspecified: Secondary | ICD-10-CM

## 2023-05-04 DIAGNOSIS — M3500 Sicca syndrome, unspecified: Secondary | ICD-10-CM

## 2023-05-04 LAB — COMPREHENSIVE METABOLIC PANEL
ALT: 17 U/L (ref 0–35)
AST: 19 U/L (ref 0–37)
Albumin: 4 g/dL (ref 3.5–5.2)
Alkaline Phosphatase: 109 U/L (ref 39–117)
BUN: 14 mg/dL (ref 6–23)
CO2: 30 meq/L (ref 19–32)
Calcium: 8.9 mg/dL (ref 8.4–10.5)
Chloride: 103 meq/L (ref 96–112)
Creatinine, Ser: 0.72 mg/dL (ref 0.40–1.20)
GFR: 88.32 mL/min (ref >60.00)
Glucose, Bld: 74 mg/dL (ref 70–99)
Potassium: 3.8 meq/L (ref 3.5–5.1)
Sodium: 139 meq/L (ref 135–145)
Total Bilirubin: 0.4 mg/dL (ref 0.2–1.2)
Total Protein: 6.2 g/dL (ref 6.0–8.3)

## 2023-05-04 LAB — LIPID PANEL
Cholesterol: 191 mg/dL (ref 0–200)
HDL: 55.2 mg/dL (ref >39.00)
LDL Cholesterol: 116 mg/dL — ABNORMAL HIGH (ref 0–99)
NonHDL: 135.47
Total CHOL/HDL Ratio: 3
Triglycerides: 95 mg/dL (ref 0.0–149.0)
VLDL: 19 mg/dL (ref 0.0–40.0)

## 2023-05-04 LAB — HEMOGLOBIN A1C: Hgb A1c MFr Bld: 6 % (ref 4.6–6.5)

## 2023-05-07 ENCOUNTER — Encounter: Payer: Self-pay | Admitting: Family Medicine

## 2023-05-07 ENCOUNTER — Ambulatory Visit: Payer: 59 | Admitting: Acute Care

## 2023-05-07 VITALS — BP 116/64 | HR 68 | Ht 64.0 in | Wt 153.0 lb

## 2023-05-07 DIAGNOSIS — R911 Solitary pulmonary nodule: Secondary | ICD-10-CM | POA: Diagnosis not present

## 2023-05-07 DIAGNOSIS — Z889 Allergy status to unspecified drugs, medicaments and biological substances status: Secondary | ICD-10-CM

## 2023-05-07 DIAGNOSIS — Z87898 Personal history of other specified conditions: Secondary | ICD-10-CM

## 2023-05-07 DIAGNOSIS — R0609 Other forms of dyspnea: Secondary | ICD-10-CM

## 2023-05-07 DIAGNOSIS — Z872 Personal history of diseases of the skin and subcutaneous tissue: Secondary | ICD-10-CM

## 2023-05-07 NOTE — Progress Notes (Unsigned)
History of Present Illness Alexis Hicks is a 63 y.o. female never smoker with positive second hand smoke exposure as a child and an adult.. ***  Osteoarthritis   05/07/2023 Pt. Presents for lung nodule consult. The lung nodule was discovered incidentally on a CT Abdomen and pelvis.  Test Results:     Latest Ref Rng & Units 10/13/2022    8:29 AM 02/19/2022    3:45 PM  CBC  WBC 4.0 - 10.5 K/uL 6.0  5.6   Hemoglobin 12.0 - 15.0 g/dL 30.8  65.7   Hematocrit 36.0 - 46.0 % 43.1  45.1   Platelets 150.0 - 400.0 K/uL 234.0  230        Latest Ref Rng & Units 05/04/2023    8:06 AM 10/13/2022    8:29 AM 02/19/2022    3:45 PM  BMP  Glucose 70 - 99 mg/dL 74  87  84   BUN 6 - 23 mg/dL 14  12  18    Creatinine 0.40 - 1.20 mg/dL 8.46  9.62  9.52   BUN/Creat Ratio 6 - 22 (calc)   SEE NOTE:   Sodium 135 - 145 mEq/L 139  140  141   Potassium 3.5 - 5.1 mEq/L 3.8  3.9  4.4   Chloride 96 - 112 mEq/L 103  105  104   CO2 19 - 32 mEq/L 30  27  31    Calcium 8.4 - 10.5 mg/dL 8.9  9.4  9.7     BNP No results found for: "BNP"  ProBNP No results found for: "PROBNP"  PFT No results found for: "FEV1PRE", "FEV1POST", "FVCPRE", "FVCPOST", "TLC", "DLCOUNC", "PREFEV1FVCRT", "PSTFEV1FVCRT"  DG BONE DENSITY (DXA) Result Date: 04/16/2023 EXAM: DUAL X-RAY ABSORPTIOMETRY (DXA) FOR BONE MINERAL DENSITY IMPRESSION: Referring Physician:  Nigel Bridgeman Your patient completed a bone mineral density test using GE Lunar iDXA system (analysis version: 16). Technologist: BEC PATIENT: Name: Alexis Hicks, Alexis Hicks Patient ID: [id number] Birth Date: [birth year] Height: 64.0 in. Sex: Female Measured: 04/16/2023 Weight: 154.8 lbs. Indications: Caucasian, Estrogen Deficient, Family Hist. (Parent hip fracture), Height Loss (781.91), Postmenopausal, Scoliosis, Vitamin D Deficient Fractures: NONE Treatments: Calcium (E943.0), Vitamin D (E933.5) ASSESSMENT: The BMD measured at Femur Neck Right is 0.693 g/cm2 with a T-score of  -2.5. This patient is considered osteoporotic according to World Health Organization [facility]) criteria. The quality of the exam is good. Site Region Measured Date Measured Age YA BMD Significant CHANGE T-score DualFemur Neck Right 04/16/2023    64.4         -2.5    0.693 g/cm2 AP Spine  L1-L4      2/16/2023    64.4         -0.8    1.087 g/cm2 DualFemur Total Mean 2/16/2023    64.4         -1.9    0.772 g/cm2 World Health Organization [facility]) criteria for post-menopausal, Caucasian Women: Normal       T-score at or above -1 SD Osteopenia   T-score between -1 and -2.5 SD Osteoporosis T-score at or below -2.5 SD RECOMMENDATION: 1. All patients should optimize calcium and vitamin D intake. 2. Consider FDA-approved medical therapies in postmenopausal women and men aged 26 years and older, based on the following: a. A hip or vertebral (clinical or morphometric) fracture. b. T-score = -2.5 at the femoral neck or spine after appropriate evaluation to exclude secondary causes. c. Low bone mass (T-score between -1.0 and -2.5 at the femoral neck or spine) and  a 10-year probability of a hip fracture = 3% or a 10-year probability of a major osteoporosis-related fracture = 20% based on the [place]-adapted WHO algorithm. d. Clinician judgment and/or patient preferences may indicate treatment for people with 10-year fracture probabilities above or below these levels. FOLLOW-UP: Patients with diagnosis of osteoporosis or at high risk for fracture should have regular bone mineral density tests.? Patients eligible for Medicare are allowed routine testing every 2 years.? The testing frequency can be increased to one year for patients who have rapidly progressing disease, are receiving or discontinuing medical therapy to restore bone mass, or have additional risk factors. I have reviewed this study and agree with the findings. [facility] Electronically Signed   By: Baird Lyons M.D.   On: 04/16/2023 12:03     Past medical  hx Past Medical History:  Diagnosis Date   Chronically dry eyes, right    Dry mouth    DUB (dysfunctional uterine bleeding)    Fibromyalgia    Osteoporosis 04/2018   Dr. Layne Benton   Pneumonia 04/2021   Raynaud's syndrome      Social History   Tobacco Use   Smoking status: Never    Passive exposure: Past   Smokeless tobacco: Never  Vaping Use   Vaping status: Never Used  Substance Use Topics   Alcohol use: Yes    Comment: occ   Drug use: No    Ms.Wilson reports that she has never smoked. She has been exposed to tobacco smoke. She has never used smokeless tobacco. She reports current alcohol use. She reports that she does not use drugs.  Tobacco Cessation: Counseling given: Not Answered   Past surgical hx, Family hx, Social hx all reviewed.  Current Outpatient Medications on File Prior to Visit  Medication Sig   ascorbic Acid (VITAMIN C) 500 MG CPCR as needed.   Biotin 1 MG CAPS Take by mouth.   Calcium Carbonate-Vitamin D (CALCIUM 600+D HIGH POTENCY PO) calcium   cholecalciferol (VITAMIN D) 1000 units tablet Take 2,000 Units by mouth daily.    Cyanocobalamin (VITAMIN B 12 PO) Take by mouth.   cycloSPORINE (RESTASIS) 0.05 % ophthalmic emulsion 1 drop daily.   fluticasone (FLONASE) 50 MCG/ACT nasal spray Place 2 sprays into both nostrils daily.   LYSINE PO Take by mouth daily.   meloxicam (MOBIC) 15 MG tablet Take 1 tablet (15 mg total) by mouth daily.   Omega-3 Fatty Acids (FISH OIL PO) Take by mouth daily.   risedronate (ACTONEL) 150 MG tablet Take 150 mg by mouth every 30 (thirty) days.   triamcinolone cream (KENALOG) 0.1 % triamcinolone acetonide 0.1 % topical cream  APPLY TO SKIN TWICE DAILY X 14 DAYS   TURMERIC PO Take by mouth daily.   Zinc 10 MG LOZG Use as directed in the mouth or throat as needed.   No current facility-administered medications on file prior to visit.     No Known Allergies  Review Of Systems:  Constitutional:   No  weight loss,  night sweats,  Fevers, chills, fatigue, or  lassitude.  HEENT:   No headaches,  Difficulty swallowing,  Tooth/dental problems, or  Sore throat,                No sneezing, itching, ear ache, nasal congestion, post nasal drip,   CV:  No chest pain,  Orthopnea, PND, swelling in lower extremities, anasarca, dizziness, palpitations, syncope.   GI  No heartburn, indigestion, abdominal pain, nausea, vomiting, diarrhea, change  in bowel habits, loss of appetite, bloody stools.   Resp: No shortness of breath with exertion or at rest.  No excess mucus, no productive cough,  No non-productive cough,  No coughing up of blood.  No change in color of mucus.  No wheezing.  No chest wall deformity  Skin: no rash or lesions.  GU: no dysuria, change in color of urine, no urgency or frequency.  No flank pain, no hematuria   MS:  No joint pain or swelling.  No decreased range of motion.  No back pain.  Psych:  No change in mood or affect. No depression or anxiety.  No memory loss.   Vital Signs BP 116/64 (BP Location: Right Arm, Patient Position: Sitting, Cuff Size: Normal)   Pulse 68   Ht 5\' 4"  (1.626 m)   Wt 153 lb (69.4 kg)   SpO2 98%   BMI 26.26 kg/m    Physical Exam:  General- No distress,  A&Ox3 ENT: No sinus tenderness, TM clear, pale nasal mucosa, no oral exudate,no post nasal drip, no LAN Cardiac: S1, S2, regular rate and rhythm, no murmur Chest: No wheeze/ rales/ dullness; no accessory muscle use, no nasal flaring, no sternal retractions Abd.: Soft Non-tender Ext: No clubbing cyanosis, edema Neuro:  normal strength Skin: No rashes, warm and dry Psych: normal mood and behavior   Assessment/Plan  No problem-specific Assessment & Plan notes found for this encounter.    Bevelyn Ngo, NP 05/07/2023  9:09 AM

## 2023-05-07 NOTE — Patient Instructions (Addendum)
It is good to see you today. We will do a dedicated  CT chest to take a look at at your entire chest. You will get a call to get this ordered. Follow up within 1 week of getting scan done so we can go over the results. I will order PFT's  You will get a call to get this scheduled. Follow up after PFT's to go over results.  Call if you need [place] for anything before we see you again.  Please contact office for sooner follow up if symptoms do not improve or worsen or seek emergency care

## 2023-05-08 ENCOUNTER — Encounter: Payer: Self-pay | Admitting: Acute Care

## 2023-05-10 NOTE — Progress Notes (Addendum)
Office Visit Note  Patient: Alexis Hicks             Date of Birth: Jun 30, 1960           MRN: [id number]             PCP: Shade Flood, MD Referring: Shade Flood, MD Visit Date: 05/24/2023 Occupation: @GUAROCC @  Subjective:  Dry mouth, dry eyes and joint pain  History of Present Illness: Alexis Hicks is a 63 y.o. female with osteoarthritis and sicca symptoms.  She states she was having some abdominal pain and was evaluated by her PCP.  She had an abdominal CT which showed nodular hepatic contour suspicious of cirrhosis.  She has an appointment coming up with the gastroenterologist.  She was also found to have a nodular opacity in the right medial lung base for which she has an appointment coming up with the pulmonologist.  She had a CT scan of the chest and the results are pending.  She also had a small abdominal and inguinal hernia.  She was advised only observation.  She has been going to physical therapy for bilateral trochanteric bursitis.  He occasionally has discomfort in her CMC joints.  She denies discomfort in any other joints.  She continues to have dry mouth and dry eye symptoms.  She has been using over-the-counter products and Restasis.     Activities of Daily Living:  Patient reports morning stiffness for 1 minute.   Patient Reports nocturnal pain.  Difficulty dressing/grooming: Denies Difficulty climbing stairs: Denies Difficulty getting out of chair: Denies Difficulty using hands for taps, buttons, cutlery, and/or writing: Denies  Review of Systems  Constitutional:  Negative for fatigue.  HENT:  Positive for mouth sores, mouth dryness and nose dryness.   Eyes:  Positive for dryness.  Respiratory:  Negative for shortness of breath.   Cardiovascular:  Negative for chest pain and palpitations.  Gastrointestinal:  Negative for blood in stool, constipation and diarrhea.  Endocrine: Negative for increased urination.  Genitourinary:  Negative  for involuntary urination.  Musculoskeletal:  Positive for joint pain, joint pain, myalgias, muscle weakness, morning stiffness and myalgias. Negative for gait problem, joint swelling and muscle tenderness.  Skin:  Negative for color change, rash, hair loss and sensitivity to sunlight.  Allergic/Immunologic: Negative for susceptible to infections.  Neurological:  Negative for dizziness and headaches.  Hematological:  Negative for swollen glands.  Psychiatric/Behavioral:  Negative for depressed mood and sleep disturbance. The patient is not nervous/anxious.     PMFS History:  Patient Active Problem List   Diagnosis Date Noted   Disorder of bone and articular cartilage 02/25/2023   Osteoarthritis 02/25/2023   Skin lesion 12/02/2021   Benign mole 09/23/2021   Right foot drop 01/09/2021   Other osteoporosis without current pathological fracture 01/09/2021   Osteopenia of multiple sites 08/25/2016   Vitamin D deficiency 08/25/2016   Fatigue 02/17/2016   Primary osteoarthritis of both feet 02/17/2016   Weakness of right lower extremity 02/17/2016   Migraine without status migrainosus, not intractable 02/17/2016   Raynaud's  02/17/2016   Sicca syndrome (HCC) 02/17/2016   DUB (dysfunctional uterine bleeding)    Fibromyalgia syndrome 09/10/2011   Primary osteoarthritis of both hands 09/10/2011   Scoliosis 09/10/2011    Past Medical History:  Diagnosis Date   Chronically dry eyes, right    Dry mouth    DUB (dysfunctional uterine bleeding)    Fibromyalgia    Osteoporosis 04/2018  Dr. Layne Benton   Pneumonia 04/2021   Raynaud's syndrome     Family History  Problem Relation Age of Onset   Arthritis Mother    Osteoporosis Mother    Dementia Mother    Skin cancer Father    Heart disease Father    Metabolic syndrome Brother    Diabetes Brother    Hypertension Brother    High Cholesterol Brother    Stroke Brother    Uterine cancer Maternal Aunt    Breast cancer Paternal Aunt     Depression Maternal Grandmother    Hypertension Maternal Grandmother    Stroke Maternal Grandmother    Kidney disease Maternal Grandfather    Breast cancer Paternal Grandmother    Gallstones Daughter    Past Surgical History:  Procedure Laterality Date   MOLE REMOVAL  12/02/2021   face   SALPINGECTOMY     Social History   Social History Narrative   Not on file   Immunization History  Administered Date(s) Administered   Influenza Inj Mdck Quad Pf 12/27/2021   Influenza, Mdck, Trivalent,PF 6+ MOS(egg free) 12/27/2022   Influenza-Unspecified 01/05/2015, 01/11/2019, 01/16/2021   PFIZER(Purple Top)SARS-COV-2 Vaccination 06/16/2019, 07/10/2019, 03/09/2020, 08/14/2020, 01/30/2021   Pfizer(Comirnaty)Fall Seasonal Vaccine 12 years and older 02/04/2023   Respiratory Syncytial Virus Vaccine,Recomb Aduvanted(Arexvy) 01/19/2023   Tdap 10/30/2021   Zoster Recombinant(Shingrix) 10/24/2020, 01/02/2021     Objective: Vital Signs: BP 101/66 (BP Location: Left Arm, Patient Position: Sitting, Cuff Size: Normal)   Pulse 65   Resp 14   Ht 5\' 4"  (1.626 m)   Wt 152 lb (68.9 kg)   BMI 26.09 kg/m    Physical Exam Vitals and nursing note reviewed.  Constitutional:      Appearance: She is well-developed.  HENT:     Head: Normocephalic and atraumatic.  Eyes:     Conjunctiva/sclera: Conjunctivae normal.  Cardiovascular:     Rate and Rhythm: Normal rate and regular rhythm.     Heart sounds: Normal heart sounds.  Pulmonary:     Effort: Pulmonary effort is normal.     Breath sounds: Normal breath sounds.  Abdominal:     General: Bowel sounds are normal.     Palpations: Abdomen is soft.  Musculoskeletal:     Cervical back: Normal range of motion.  Lymphadenopathy:     Cervical: No cervical adenopathy.  Skin:    General: Skin is warm and dry.     Capillary Refill: Capillary refill takes less than 2 seconds.  Neurological:     Mental Status: She is alert and oriented to person, place, and  time.  Psychiatric:        Behavior: Behavior normal.      Musculoskeletal Exam: She had limited lateral rotation of the cervical spine.  Thoracic kyphosis was noted.  There was no tenderness over thoracic or lumbar spine.  Shoulders, elbows, wrists, MCPs PIPs and DIPs been good range of motion with no synovitis.  Hip joints and knee joints in good range of motion without any warmth swelling or effusion.  There was no tenderness over ankles or MTPs.  Right ankle brace was noted.  CDAI Exam: CDAI Score: -- Patient Global: --; Provider Global: -- Swollen: --; Tender: -- Joint Exam 05/24/2023   No joint exam has been documented for this visit   There is currently no information documented on the homunculus. Go to the Rheumatology activity and complete the homunculus joint exam.  Investigation: No additional findings.  Imaging: No results found.  Recent Labs: Lab Results  Component Value Date   WBC 6.0 10/13/2022   HGB 14.2 10/13/2022   PLT 234.0 10/13/2022   NA 139 05/04/2023   K 3.8 05/04/2023   CL 103 05/04/2023   CO2 30 05/04/2023   GLUCOSE 74 05/04/2023   BUN 14 05/04/2023   CREATININE 0.72 05/04/2023   BILITOT 0.4 05/04/2023   ALKPHOS 109 05/04/2023   AST 19 05/04/2023   ALT 17 05/04/2023   PROT 6.2 05/04/2023   ALBUMIN 4.0 05/04/2023   CALCIUM 8.9 05/04/2023    Speciality Comments: Boniva 1 dose 03/21-SEs actonel 04/21  Procedures:  No procedures performed Allergies: Patient has no known allergies.   Assessment / Plan:     Visit Diagnoses: Sicca syndrome (HCC) -ANA 1: 40, SSA negative, SSB negative, RF negative: She continues to have sicca symptoms.  She has been using Restasis and over-the-counter products.  Her symptoms are manageable.  Labs obtained in July were reviewed.  She was advised to continue with the over-the-counter products.  I would hold off all getting serology at this point as she is not having any symptoms besides sicca symptoms.  Primary  osteoarthritis of both hands-she has mild PIP and DIP thickening noted.  She continues to have some discomfort in her CMC joints intermittently.  Joint protection muscle strengthening was discussed.  Trochanteric bursitis of both hips-she has been going to physical therapy which has been helpful.  Primary osteoarthritis of both feet-she denies any discomfort in her feet.  Right foot drop-she is a brace.  DDD (degenerative disc disease), cervical-she continues to have some stiffness in her cervical spine.  She denies any radiculopathy.  Other form of scoliosis of thoracolumbar spine  Spondylosis without myelopathy or radiculopathy, lumbar region-she has intermittent pain in her lower back.  She denies any radiculopathy.  History of abdominal pain-patient states she had further workup which showed a small abdominal hernia and an inguinal hernia.  No surgery was advised.  CT of the abdomen showed possible cirrhosis for which she has been seeing gastroenterologist.  She also was found to have a nodule in her lung for which she had a CT scan of the chest and has an appointment coming up with a pulmonologist.  Fibromyalgia syndrome-she continues to have some generalized pain and discomfort from fibromyalgia.  The pain is manageable.  She has been doing the stretching exercises which has been helpful.  Other osteoporosis without current pathological fracture - Previously on Actonel 150 mg tablets every month-started in April 2021.DEXA 04/16/2023: BMD measured at Femur Neck Right is 0.693 g/cm2 with a T-score of -2.5.  She is on calcium and vitamin D.  I discussed DEXA scan results with the patient.  She is in the osteoporosis range.  Her previous T-score was -2.7 in 2022.  Patient has an appointment coming up with her GYN.  I discussed with her that she has to be off Actonel for 2 years prior to starting it.  She will discuss it further with her GYN.  She can resume Actonel after 2 years of.  Vitamin D  deficiency  Other fatigue  History of migraine  Orders: No orders of the defined types were placed in this encounter.  No orders of the defined types were placed in this encounter.    Follow-Up Instructions: Return for Osteoarthritis.   Pollyann Savoy, MD  Note - This record has been created using Animal nutritionist.  Chart creation errors have been sought, but may not always  have been located. Such creation errors do not reflect on  the standard of medical care.

## 2023-05-12 ENCOUNTER — Ambulatory Visit: Payer: 59 | Admitting: Family Medicine

## 2023-05-12 VITALS — BP 108/70 | HR 61 | Temp 97.9°F | Ht 64.0 in | Wt 153.4 lb

## 2023-05-12 DIAGNOSIS — M810 Age-related osteoporosis without current pathological fracture: Secondary | ICD-10-CM

## 2023-05-12 DIAGNOSIS — M7062 Trochanteric bursitis, left hip: Secondary | ICD-10-CM

## 2023-05-12 DIAGNOSIS — R932 Abnormal findings on diagnostic imaging of liver and biliary tract: Secondary | ICD-10-CM | POA: Diagnosis not present

## 2023-05-12 DIAGNOSIS — R7303 Prediabetes: Secondary | ICD-10-CM | POA: Diagnosis not present

## 2023-05-12 DIAGNOSIS — J309 Allergic rhinitis, unspecified: Secondary | ICD-10-CM

## 2023-05-12 DIAGNOSIS — R911 Solitary pulmonary nodule: Secondary | ICD-10-CM | POA: Diagnosis not present

## 2023-05-12 DIAGNOSIS — E785 Hyperlipidemia, unspecified: Secondary | ICD-10-CM | POA: Diagnosis not present

## 2023-05-12 DIAGNOSIS — M7061 Trochanteric bursitis, right hip: Secondary | ICD-10-CM

## 2023-05-12 MED ORDER — FLUTICASONE PROPIONATE 50 MCG/ACT NA SUSP: 2.0000 | Freq: Every day | NASAL | 11 refills | Status: AC

## 2023-05-12 NOTE — Progress Notes (Signed)
 Subjective:  Patient ID: Alexis Hicks, female    DOB: 03-03-1961  Age: 63 y.o. MRN: [id number]  CC:  Chief Complaint  Patient presents with   Medical Management of Chronic Issues    Pt here for 6 month check in and discuss lab results, no questions     HPI Alexis Hicks presents for   Right inguinal hernia, umbilical hernia Discussed January 9.  Referred to general surgery.  Evaluated January 15.  Plan for continued monitoring and consider surgical repair if symptomatic or activity limiting.  Diastasis recti also discussed.   Lung nodule Also discussed last visit.  Noted on imaging, 12 mm nodule, referred to pulmonary to decide on further testing or repeat imaging options. Pulm eval 1/31 - planned dedicated CT chest and PFT's scheduled.   Abnormal liver finding On recent CT possible cirrhosis noted.  Normal LFTs.  Option to meet with hepatologist.  Plan for travel to [place] in May to June. Visiting grandchildren.  Alcohol - none since December. 1-2 drinks per week prior.  No n/v/abd pain.  Troch bursitis: Has been seeing PT and HEP. Helping. Better.   Osteoporosis: Bone density testing 04/16/2023 with T-score -2.5 at right neck femur. Compared to 2022, T-score of -2.7 at the femoral neck, some improvement.  Followed by rheumatology.  Plans to discuss medication with Dr. Dolphus. taking calcium and vitamin D . prior use of bisphosphonate Actonel.    Lab Results  Component Value Date   ALT 17 05/04/2023   AST 19 05/04/2023   ALKPHOS 109 05/04/2023   BILITOT 0.4 05/04/2023    Hyperlipidemia: With low ASCVD risk score 3.5% when checked at her last physical.  Improved on most recent testing.  LDL decreased from 139-116. No early FH of heart disease in family. Parents lived into 48's.  The 10-year ASCVD risk score (Arnett DK, et al., 2019) is: 3.5%   Values used to calculate the score:     Age: 49 years     Sex: Female     Is Non-Hispanic African American:  No     Diabetic: No     Tobacco smoker: No     Systolic Blood Pressure: 108 mmHg     Is BP treated: No     HDL Cholesterol: 55.2 mg/dL     Total Cholesterol: 191 mg/dL  Lab Results  Component Value Date   CHOL 191 05/04/2023   HDL 55.20 05/04/2023   LDLCALC 116 (H) 05/04/2023   TRIG 95.0 05/04/2023   CHOLHDL 3 05/04/2023   Lab Results  Component Value Date   ALT 17 05/04/2023   AST 19 05/04/2023   ALKPHOS 109 05/04/2023   BILITOT 0.4 05/04/2023   Prediabetes: Weight has been stable, A1c stable on recent testing.  Continued diet/exercise approach. Lab Results  Component Value Date   HGBA1C 6.0 05/04/2023   Wt Readings from Last 3 Encounters:  05/12/23 153 lb 6.4 oz (69.6 kg)  05/07/23 153 lb (69.4 kg)  04/15/23 153 lb 9.6 oz (69.7 kg)   Allergic rhinitis Treated with Flonase . Using flonase  otc. Effective.   History Patient Active Problem List   Diagnosis Date Noted   Disorder of bone and articular cartilage 02/25/2023   Osteoarthritis 02/25/2023   Skin lesion 12/02/2021   Benign mole 09/23/2021   Right foot drop 01/09/2021   Other osteoporosis without current pathological fracture 01/09/2021   Osteopenia of multiple sites 08/25/2016   Vitamin D  deficiency 08/25/2016   Fatigue 02/17/2016   Primary osteoarthritis  of both feet 02/17/2016   Weakness of right lower extremity 02/17/2016   Migraine without status migrainosus, not intractable 02/17/2016   Raynaud's  02/17/2016   Sicca syndrome (HCC) 02/17/2016   DUB (dysfunctional uterine bleeding)    Fibromyalgia syndrome 09/10/2011   Primary osteoarthritis of both hands 09/10/2011   Scoliosis 09/10/2011   Past Medical History:  Diagnosis Date   Chronically dry eyes, right    Dry mouth    DUB (dysfunctional uterine bleeding)    Fibromyalgia    Osteoporosis 04/2018   Dr. Norine   Pneumonia 04/2021   Raynaud's syndrome    Past Surgical History:  Procedure Laterality Date   MOLE REMOVAL  12/02/2021    face   SALPINGECTOMY     No Known Allergies Prior to Admission medications   Medication Sig Start Date End Date Taking? Authorizing Provider  ascorbic Acid (VITAMIN C) 500 MG CPCR as needed.   Yes [provider]  Biotin 1 MG CAPS Take by mouth.   Yes [provider]  Calcium Carbonate-Vitamin D  (CALCIUM 600+D HIGH POTENCY PO) calcium   Yes [provider]  cholecalciferol (VITAMIN D ) 1000 units tablet Take 2,000 Units by mouth daily.    Yes [provider]  Cyanocobalamin (VITAMIN B 12 PO) Take by mouth.   Yes [provider]  cycloSPORINE  (RESTASIS ) 0.05 % ophthalmic emulsion 1 drop daily.   Yes [provider]  fluticasone  (FLONASE ) 50 MCG/ACT nasal spray Place 2 sprays into both nostrils daily. 11/05/22  Yes Levora Alexis SAUNDERS, MD  LYSINE PO Take by mouth daily.   Yes [provider]  meloxicam  (MOBIC ) 15 MG tablet Take 1 tablet (15 mg total) by mouth daily. 02/26/23  Yes Tabori, Katherine E, MD  Omega-3 Fatty Acids (FISH OIL PO) Take by mouth daily.   Yes [provider]  risedronate (ACTONEL) 150 MG tablet Take 150 mg by mouth every 30 (thirty) days. 01/11/20  Yes [provider]  triamcinolone cream (KENALOG) 0.1 % triamcinolone acetonide 0.1 % topical cream  APPLY TO SKIN TWICE DAILY X 14 DAYS   Yes [provider]  TURMERIC PO Take by mouth daily.   Yes [provider]  Zinc 10 MG LOZG Use as directed in the mouth or throat as needed.   Yes [provider]   Social History   Socioeconomic History   Marital status: Married    Spouse name: Not on file   Number of children: Not on file   Years of education: Not on file   Highest education level: Not on file  Occupational History   Not on file  Tobacco Use   Smoking status: Never    Passive exposure: Past   Smokeless tobacco: Never  Vaping Use   Vaping status: Never Used  Substance and Sexual Activity   Alcohol use: Yes     Comment: occ   Drug use: No   Sexual activity: Not Currently  Other Topics Concern   Not on file  Social History Narrative   Not on file   Social Drivers of Health   Financial Resource Strain: Not on file  Food Insecurity: Not on file  Transportation Needs: Not on file  Physical Activity: Not on file  Stress: Not on file  Social Connections: Not on file  Intimate Partner Violence: Not on file    Review of Systems Per HPI.   Objective:   Vitals:   05/12/23 0932  BP: 108/70  Pulse: 61  Temp: 97.9 F (36.6 C)  TempSrc: Temporal  SpO2: 97%  Weight: 153 lb 6.4 oz (69.6 kg)  Height: 5' 4 (1.626 m)     Physical Exam Vitals reviewed.  Constitutional:      Appearance: Normal appearance. She is well-developed.  HENT:     Head: Normocephalic and atraumatic.  Eyes:     Conjunctiva/sclera: Conjunctivae normal.     Pupils: Pupils are equal, round, and reactive to light.  Neck:     Vascular: No carotid bruit.  Cardiovascular:     Rate and Rhythm: Normal rate and regular rhythm.     Heart sounds: Normal heart sounds.  Pulmonary:     Effort: Pulmonary effort is normal.     Breath sounds: Normal breath sounds.  Abdominal:     General: Abdomen is flat. There is no distension.     Palpations: Abdomen is soft. There is no pulsatile mass.     Tenderness: There is no abdominal tenderness. There is no guarding.  Musculoskeletal:     Right lower leg: No edema.     Left lower leg: No edema.  Skin:    General: Skin is warm and dry.  Neurological:     Mental Status: She is alert and oriented to person, place, and time.  Psychiatric:        Mood and Affect: Mood normal.        Behavior: Behavior normal.        Assessment & Plan:  Alexis Hicks is a 63 y.o. female . Prediabetes  -Stable on recent testing, continue diet/exercise approach.  Hyperlipidemia, unspecified hyperlipidemia type  -Holding meds for now given low ASCVD risk score, improved readings,  no family history of early heart disease.  Abnormal finding on imaging of liver - Plan: Ambulatory referral to Gastroenterology  -Reassuring labs but recommended discussion with GI to decide if other testing needed or monitoring intervals for changes.  Lung nodule  -Has seen pulmonary with dedicated CT planned.  Trochanteric bursitis of both hips  -Improved with PT.  Continue same.  Allergic rhinitis, unspecified seasonality, unspecified trigger - Plan: fluticasone  (FLONASE ) 50 MCG/ACT nasal spray  -Stable Flonase , refilled.  Osteoporosis without current pathological fracture, unspecified osteoporosis type  -Prior bisphosphonate.  Somewhat improved T-score on most recent testing.  She is on calcium and vitamin D .  As she is followed by rheumatology, advised discussion of treatment with her rheumatologist.  Hold on new meds for now.  Meds ordered this encounter  Medications   fluticasone  (FLONASE ) 50 MCG/ACT nasal spray    Sig: Place 2 sprays into both nostrils daily.    Dispense:  48 mL    Refill:  11   Patient Instructions  Use either flonase  prescribed or sensimist over the counter if better tolerated.  Recent labs looked ok.  Keep follow-up with pulmonary as planned.  If any worsening symptoms with the hernias, follow-up with general surgery. I did place a referral to gastroenterology to look at the imaging and question of possible cirrhosis but recent labs were reassuring.  They can decide if any other testing needed. Follow-up with rheumatology to discuss the most recent bone density testing and need for medications.  No new meds for now for me.  Continue calcium vitamin D  supplementation and stay active.  Follow-up in 6 months but let me know if there are any questions sooner.  Take care and have a great trip to [place]!    Signed,   Alexis Pines, MD [place]  [facility], [facility] 05/12/23 10:15 AM

## 2023-05-12 NOTE — Patient Instructions (Addendum)
 Use either flonase  prescribed or sensimist over the counter if better tolerated.  Recent labs looked ok.  Keep follow-up with pulmonary as planned.  If any worsening symptoms with the hernias, follow-up with general surgery. I did place a referral to gastroenterology to look at the imaging and question of possible cirrhosis but recent labs were reassuring.  They can decide if any other testing needed. Follow-up with rheumatology to discuss the most recent bone density testing and need for medications.  No new meds for now for me.  Continue calcium vitamin D  supplementation and stay active.  Follow-up in 6 months but let me know if there are any questions sooner.  Take care and have a great trip to [place]!

## 2023-05-16 ENCOUNTER — Encounter: Payer: Self-pay | Admitting: Family Medicine

## 2023-05-20 ENCOUNTER — Ambulatory Visit
Admission: RE | Admit: 2023-05-20 | Discharge: 2023-05-20 | Disposition: A | Discharge status: Home / Self Care | Payer: 59 | Source: Ambulatory Visit | Attending: Acute Care | Admitting: Acute Care

## 2023-05-20 DIAGNOSIS — R911 Solitary pulmonary nodule: Secondary | ICD-10-CM

## 2023-05-24 ENCOUNTER — Ambulatory Visit: Payer: 59 | Attending: Rheumatology | Admitting: Rheumatology

## 2023-05-24 ENCOUNTER — Telehealth: Payer: Self-pay | Admitting: Rheumatology

## 2023-05-24 ENCOUNTER — Encounter: Payer: Self-pay | Admitting: Rheumatology

## 2023-05-24 VITALS — BP 101/66 | HR 65 | Resp 14 | Ht 64.0 in | Wt 152.0 lb

## 2023-05-24 DIAGNOSIS — M7062 Trochanteric bursitis, left hip: Secondary | ICD-10-CM

## 2023-05-24 DIAGNOSIS — M4185 Other forms of scoliosis, thoracolumbar region: Secondary | ICD-10-CM

## 2023-05-24 DIAGNOSIS — M797 Fibromyalgia: Secondary | ICD-10-CM

## 2023-05-24 DIAGNOSIS — Z87898 Personal history of other specified conditions: Secondary | ICD-10-CM

## 2023-05-24 DIAGNOSIS — M19072 Primary osteoarthritis, left ankle and foot: Secondary | ICD-10-CM

## 2023-05-24 DIAGNOSIS — Z8669 Personal history of other diseases of the nervous system and sense organs: Secondary | ICD-10-CM

## 2023-05-24 DIAGNOSIS — R768 Other specified abnormal immunological findings in serum: Secondary | ICD-10-CM

## 2023-05-24 DIAGNOSIS — R5383 Other fatigue: Secondary | ICD-10-CM

## 2023-05-24 DIAGNOSIS — M19071 Primary osteoarthritis, right ankle and foot: Secondary | ICD-10-CM | POA: Diagnosis not present

## 2023-05-24 DIAGNOSIS — M19042 Primary osteoarthritis, left hand: Secondary | ICD-10-CM

## 2023-05-24 DIAGNOSIS — M5030 Other cervical disc degeneration, unspecified cervical region: Secondary | ICD-10-CM

## 2023-05-24 DIAGNOSIS — M3500 Sicca syndrome, unspecified: Secondary | ICD-10-CM

## 2023-05-24 DIAGNOSIS — E559 Vitamin D deficiency, unspecified: Secondary | ICD-10-CM

## 2023-05-24 DIAGNOSIS — M7061 Trochanteric bursitis, right hip: Secondary | ICD-10-CM

## 2023-05-24 DIAGNOSIS — M47816 Spondylosis without myelopathy or radiculopathy, lumbar region: Secondary | ICD-10-CM

## 2023-05-24 DIAGNOSIS — M19041 Primary osteoarthritis, right hand: Secondary | ICD-10-CM

## 2023-05-24 DIAGNOSIS — M21371 Foot drop, right foot: Secondary | ICD-10-CM

## 2023-05-24 DIAGNOSIS — M818 Other osteoporosis without current pathological fracture: Secondary | ICD-10-CM

## 2023-05-24 NOTE — Telephone Encounter (Signed)
Patient states she had a bone density test on 04/16/23 and forgot to discuss the results with Dr. Corliss Skains at her appointment this morning.

## 2023-05-25 ENCOUNTER — Encounter: Payer: Self-pay | Admitting: Physician Assistant

## 2023-05-26 ENCOUNTER — Encounter (HOSPITAL_BASED_OUTPATIENT_CLINIC_OR_DEPARTMENT_OTHER): Payer: 59

## 2023-06-14 ENCOUNTER — Ambulatory Visit (HOSPITAL_BASED_OUTPATIENT_CLINIC_OR_DEPARTMENT_OTHER): Payer: 59 | Admitting: Emergency Medicine

## 2023-06-14 DIAGNOSIS — R0609 Other forms of dyspnea: Secondary | ICD-10-CM

## 2023-06-14 LAB — PULMONARY FUNCTION TEST
DL/VA % pred: 130 %
DL/VA: 5.45 ml/min/mmHg/L
DLCO cor % pred: 109 %
DLCO cor: 21.92 ml/min/mmHg
DLCO unc % pred: 109 %
DLCO unc: 21.92 ml/min/mmHg
FEF 25-75 Post: 3.45 L/s
FEF 25-75 Pre: 3.15 L/s
FEF2575-%Change-Post: 9 %
FEF2575-%Pred-Post: 157 %
FEF2575-%Pred-Pre: 143 %
FEV1-%Change-Post: 7 %
FEV1-%Pred-Post: 85 %
FEV1-%Pred-Pre: 79 %
FEV1-Post: 2.11 L
FEV1-Pre: 1.97 L
FEV1FVC-%Change-Post: -2 %
FEV1FVC-%Pred-Pre: 129 %
FEV6-%Change-Post: 10 %
FEV6-%Pred-Post: 70 %
FEV6-%Pred-Pre: 63 %
FEV6-Post: 2.18 L
FEV6-Pre: 1.97 L
FEV6FVC-%Pred-Post: 104 %
FEV6FVC-%Pred-Pre: 104 %
FVC-%Change-Post: 10 %
FVC-%Pred-Post: 67 %
FVC-%Pred-Pre: 61 %
FVC-Post: 2.18 L
FVC-Pre: 1.97 L
Post FEV1/FVC ratio: 97 %
Post FEV6/FVC ratio: 100 %
Pre FEV1/FVC ratio: 100 %
Pre FEV6/FVC Ratio: 100 %
RV % pred: 89 %
RV: 1.86 L
TLC % pred: 90 %
TLC: 4.6 L

## 2023-06-14 NOTE — Progress Notes (Signed)
 Full PFT Performed Today

## 2023-06-14 NOTE — Patient Instructions (Signed)
 Full PFT Performed Today

## 2023-06-18 ENCOUNTER — Encounter: Payer: Self-pay | Admitting: Acute Care

## 2023-06-18 ENCOUNTER — Ambulatory Visit: Payer: 59 | Admitting: Acute Care

## 2023-06-18 VITALS — BP 114/69 | HR 63 | Ht 64.0 in | Wt 150.8 lb

## 2023-06-18 DIAGNOSIS — Z23 Encounter for immunization: Secondary | ICD-10-CM

## 2023-06-18 DIAGNOSIS — R0609 Other forms of dyspnea: Secondary | ICD-10-CM | POA: Diagnosis not present

## 2023-06-18 DIAGNOSIS — R911 Solitary pulmonary nodule: Secondary | ICD-10-CM

## 2023-06-18 MED ORDER — ALBUTEROL SULFATE HFA 108 (90 BASE) MCG/ACT IN AERS
1.0000 | INHALATION_SPRAY | Freq: Four times a day (QID) | RESPIRATORY_TRACT | 2 refills | Status: AC | PRN
Start: 2023-06-18 — End: ?

## 2023-06-18 NOTE — Patient Instructions (Addendum)
 It is good to see you today. Your CT chest shows a stable 6 mm nodule. We will do a 6 month follow up scan in June. Follow up with me after the scan to review the results. I will prescribe an albuterol inhaler for use with your exertional dyspnea. Use 1-2 puffs as needed for shortness of breath or wheezing.  Do not use more than twice daily.  Pneumococcal vaccine today. Call if you need [place] sooner. Have fun in [place]  Please contact office for sooner follow up if symptoms do not improve or worsen or seek emergency care

## 2023-06-18 NOTE — Progress Notes (Signed)
 History of Present Illness Alexis Hicks is a 63 y.o. female never smoker with positive second hand smoke exposure as a child and an adult referred for pulmonary nodule consult January of 2025   Synopsis Pt had an incidentally found lung nodule on CT scan of the abdomen and pelvis performed for abdominal discomfort 03/2023.   She has a history of secondhand smoke exposure both as a child and as an adult, although she has never smoked herself. This exposure is relevant given the incidental finding of the lung nodule. Over the past two years, she has experienced pneumonia and multiple COVID-19 infections despite being vaccinated. These respiratory infections could potentially contribute to changes in lung tissue.   She experiences shortness of breath with activity, which has been present for years, even during her time as a runner. She also has eczema.. No prior pulmonary function testing has been done, raising the possibility of undiagnosed asthma or exercise-induced bronchospasm.   She has experienced recent weight gain , no weight loss over the past two to three years, which she is beginning to reverse. She has also started snoring recently, which she attributes to weight gain and possibly low humidity in her new home. She has not noticed any pauses in breathing during sleep.   She has osteoarthritis in multiple joints including her hands, hips, knees, ankles, feet, and neck. She is not on any immunosuppressive medications and has not been prescribed any biologics for her condition.   She is married, lives with her husband,and  has grown children.  She was seen 05/07/2023. CT Chest was done to better evaluate the lung nodule.She is here today to review the results, as well as review PFT's done to better evaluate her dyspnea.       06/18/2023 Pt. Presents for follow up. She states she has been doing well. She denies any respiratory issues, other than the dyspnea she notes with  extreme exertion.  Her husband is here with her today and he suspects part of this may be related to some anxiety .  We have reviewed the results of her surveillance 68-month CT chest done 05/20/2023.  The CT shows a stable 6 mm right lower lobe pulmonary nodule that is unchanged since previous imaging.  Per radiology they favored that this is paraspinal scarring related to frequent pneumonias and recent COVID.  Plan will be for a 66-month follow-up CT chest in June 2025 to confirm stability. Patient is in agreement with this plan.  We had also done pulmonary function testing to evaluate for her shortness of breath.  PFTs look more restrictive, FF ratio is 129% predicted.  She is in agreement with use of albuterol as needed for her dyspnea with exertion.  She will let me know if she decides she would like to start any maintenance therapy.  She and her husband are traveling to [place] in a few weeks to visit children.  Patient had never had pneumococcal vaccine.  With her history of pneumonia, and frequent COVID we did provide her with Prevnar vaccination today.  Test Results: CT Chest 05/20/2023 6 mm in the right lower lobe, along the right hemidiaphragm (4 x 8 mm, 5/117), and 2 mm lateral right lower lobe nodule (5/92), unchanged. Ground-glass and consolidation in the medial right lower lobe is seen adjacent to multiple osteophytes, indicative of scarring. No pleural fluid. Airway is unremarkable.  Favor paraspinal scarring throughout the medial right lower lobe. Pulmonary nodules measure 6 mm or less in size,  stable. Non-contrast chest CT at 3-6 months is recommended. If the nodules are stable at time of repeat CT, then future CT at 18-24 months (from today's scan) is considered optional for low-risk patients, but is recommended for high-risk patients     PFT 06/14/2023              CT Abdomen and Pelvis 03/2023 FINDINGS: Lower chest: 12 mm nodular opacity seen in the right  medial lung base (image 14 of series 9 and image 82 of series 6).   Hepatobiliary: 1.0 cm low-density lesion in the caudate consistent with a simple cyst. Nodular hepatic contours suspicious for cirrhosis. No biliary dilatation. Gallbladder is unremarkable.   Pancreas: Unremarkable. No pancreatic ductal dilatation or surrounding inflammatory changes.   Spleen: Normal in size without focal abnormality.   Adrenals/Urinary Tract: Adrenal glands are unremarkable. Kidneys are normal, without renal calculi, focal lesion, or hydronephrosis. Bladder is unremarkable.   Stomach/Bowel: Stomach is within normal limits. Appendix is not seen and may be surgically absent. No evidence of bowel wall thickening, distention, or inflammatory changes.   Vascular/Lymphatic: No significant vascular findings are present. No enlarged abdominal or pelvic lymph nodes.   Reproductive: Uterus and bilateral adnexa are unremarkable.   Other: Small fat containing umbilical hernia. Small fat containing right inguinal hernia. No abnormality of the left upper quadrant abdominal wall.   Musculoskeletal: No acute osseous abnormality. Dextroconvex curvature of the thoracolumbar spine. Moderate asymmetric right hip osteoarthrosis.   IMPRESSION: 1. No acute abnormality of the abdomen or pelvis. 2. No hernia or mass identified in the anterior abdominal wall to explain patient's symptoms. 3. Nodular hepatic contours suspicious for cirrhosis. 4. 12 mm nodular opacity in the right medial lung base. Consider one of the following in 3 months for both low-risk and high-risk individuals: (a) repeat chest CT, (b) follow-up PET-CT, or (c) tissue sampling.       Latest Ref Rng & Units 10/13/2022    8:29 AM 02/19/2022    3:45 PM  CBC  WBC 4.0 - 10.5 K/uL 6.0  5.6   Hemoglobin 12.0 - 15.0 g/dL 16.1  09.6   Hematocrit 36.0 - 46.0 % 43.1  45.1   Platelets 150.0 - 400.0 K/uL 234.0  230        Latest Ref Rng & Units  05/04/2023    8:06 AM 10/13/2022    8:29 AM 02/19/2022    3:45 PM  BMP  Glucose 70 - 99 mg/dL 74  87  84   BUN 6 - 23 mg/dL 14  12  18    Creatinine 0.40 - 1.20 mg/dL 0.45  4.09  8.11   BUN/Creat Ratio 6 - 22 (calc)   SEE NOTE:   Sodium 135 - 145 mEq/L 139  140  141   Potassium 3.5 - 5.1 mEq/L 3.8  3.9  4.4   Chloride 96 - 112 mEq/L 103  105  104   CO2 19 - 32 mEq/L 30  27  31    Calcium 8.4 - 10.5 mg/dL 8.9  9.4  9.7     BNP No results found for: "BNP"  ProBNP No results found for: "PROBNP"  PFT    Component Value Date/Time   FEV1PRE 1.97 06/14/2023 1450   FEV1POST 2.11 06/14/2023 1450   FVCPRE 1.97 06/14/2023 1450   FVCPOST 2.18 06/14/2023 1450   TLC 4.60 06/14/2023 1450   DLCOUNC 21.92 06/14/2023 1450   PREFEV1FVCRT 100 06/14/2023 1450   PSTFEV1FVCRT 97 06/14/2023 1450  CT CHEST WO CONTRAST Result Date: 06/07/2023 CLINICAL DATA:  Lung nodule. EXAM: CT CHEST WITHOUT CONTRAST TECHNIQUE: Multidetector CT imaging of the chest was performed following the standard protocol without IV contrast. RADIATION DOSE REDUCTION: This exam was performed according to the departmental dose-optimization program which includes automated exposure control, adjustment of the mA and/or kV according to patient size and/or use of iterative reconstruction technique. COMPARISON:  CT abdomen pelvis 04/06/2023. FINDINGS: Cardiovascular: Ascending aorta measures up to 3.8 cm (coronal image 83). Heart is at the upper limits of normal in size to mildly enlarged. No pericardial effusion. Mediastinum/Nodes: No pathologically enlarged mediastinal or axillary lymph nodes. Hilar regions are difficult to definitively evaluate without IV contrast. Esophagus is grossly unremarkable. Lungs/Pleura: 6 mm in the right lower lobe, along the right hemidiaphragm (4 x 8 mm, 5/117), and 2 mm lateral right lower lobe nodule (5/92), unchanged. Ground-glass and consolidation in the medial right lower lobe is seen adjacent to multiple  osteophytes, indicative of scarring. No pleural fluid. Airway is unremarkable. Upper Abdomen: Small hepatic cysts. No specific follow-up necessary. Visualized portions of the liver, adrenal glands, kidneys, spleen, pancreas, stomach and bowel are otherwise grossly unremarkable. No upper abdominal adenopathy. Musculoskeletal: Degenerative changes in the spine. IMPRESSION: 1. Favor paraspinal scarring throughout the medial right lower lobe. 2. Pulmonary nodules measure 6 mm or less in size, stable. Non-contrast chest CT at 3-6 months is recommended. If the nodules are stable at time of repeat CT, then future CT at 18-24 months (from today's scan) is considered optional for low-risk patients, but is recommended for high-risk patients. This recommendation follows the consensus statement: Guidelines for Management of Incidental Pulmonary Nodules Detected on CT Images: From the Fleischner Society 2017; Radiology 2017; 284:228-243. Electronically Signed   By: Leanna Battles M.D.   On: 06/07/2023 08:44     Past medical hx Past Medical History:  Diagnosis Date   Chronically dry eyes, right    Dry mouth    DUB (dysfunctional uterine bleeding)    Fibromyalgia    Osteoporosis 04/2018   Dr. Layne Benton   Pneumonia 04/2021   Raynaud's syndrome      Social History   Tobacco Use   Smoking status: Never    Passive exposure: Past   Smokeless tobacco: Never  Vaping Use   Vaping status: Never Used  Substance Use Topics   Alcohol use: Yes    Comment: rarely   Drug use: No    Ms.Razavi reports that she has never smoked. She has been exposed to tobacco smoke. She has never used smokeless tobacco. She reports current alcohol use. She reports that she does not use drugs.  Tobacco Cessation: Counseling given: Not Answered   Past surgical hx, Family hx, Social hx all reviewed.  Current Outpatient Medications on File Prior to Visit  Medication Sig   ascorbic Acid (VITAMIN C) 500 MG CPCR as needed. Pt  takes half of the dosage listed   Biotin 1 MG CAPS Take by mouth.   CALCIUM PO Take 350 mg by mouth daily.   cholecalciferol (VITAMIN D) 1000 units tablet Take 2,000 Units by mouth daily.    Cyanocobalamin (VITAMIN B 12 PO) Take by mouth.   cycloSPORINE (RESTASIS) 0.05 % ophthalmic emulsion 1 drop daily.   fluticasone (FLONASE) 50 MCG/ACT nasal spray Place 2 sprays into both nostrils daily.   LYSINE PO Take by mouth daily.   triamcinolone cream (KENALOG) 0.1 % triamcinolone acetonide 0.1 % topical cream  APPLY TO SKIN TWICE  DAILY X 14 DAYS   Zinc 10 MG LOZG Use as directed in the mouth or throat as needed.   No current facility-administered medications on file prior to visit.     No Known Allergies  Review Of Systems:  Constitutional:   No  weight loss, night sweats,  Fevers, chills, fatigue, or  lassitude.  HEENT:   No headaches,  Difficulty swallowing,  Tooth/dental problems, or  Sore throat,                No sneezing, itching, ear ache, nasal congestion, post nasal drip,   CV:  No chest pain,  Orthopnea, PND, swelling in lower extremities, anasarca, dizziness, palpitations, syncope.   GI  No heartburn, indigestion, abdominal pain, nausea, vomiting, diarrhea, change in bowel habits, loss of appetite, bloody stools.   Resp: + shortness of breath with exertion not  at rest.  No excess mucus, no productive cough,  No non-productive cough,  No coughing up of blood.  No change in color of mucus.  No wheezing.  No chest wall deformity, she does endorse allergies.   Skin: no rash or lesions.  GU: no dysuria, change in color of urine, no urgency or frequency.  No flank pain, no hematuria   MS:  No joint pain or swelling.  No decreased range of motion.  No back pain.  Psych:  No change in mood or affect. No depression or anxiety.  No memory loss.   Vital Signs BP 114/69 (BP Location: Left Arm, Patient Position: Sitting, Cuff Size: Normal)   Pulse 63   Ht 5\' 4"  (1.626 m)   Wt 150  lb 12.8 oz (68.4 kg)   SpO2 100%   BMI 25.88 kg/m    Physical Exam:  General- No distress,  A&Ox3, pleasant ENT: No sinus tenderness, TM clear, pale nasal mucosa, no oral exudate,no post nasal drip, no LAN Cardiac: S1, S2, regular rate and rhythm, no murmur Chest: No wheeze/ rales/ dullness; no accessory muscle use, no nasal flaring, no sternal retractions Abd.: Soft Non-tender, nondistended, bowel sounds positive, Body mass index is 25.88 kg/m.  Ext: No clubbing cyanosis, edema Neuro:  normal strength, moving all extremities x 4, alert and oriented x 3, appropriate Skin: No rashes, warm and dry, no obvious lesions Psych: normal mood and behavior   Assessment/Plan Stable 6 mm in the right lower lobe and a never smoker Dyspnea with exertion Healthcare maintenance need for vaccination Plan Your CT chest shows a stable 6 mm nodule. We will do a 6 month follow up scan in June. Follow up with me after the scan to review the results. I will prescribe an albuterol inhaler for use with your exertional dyspnea. Use 1-2 puffs as needed for shortness of breath or wheezing.  Do not use more than twice daily.  Pneumococcal vaccine today. Call if you need [place] sooner. Have fun in [place]  Please contact office for sooner follow up if symptoms do not improve or worsen or seek emergency care   I spent 35 minutes dedicated to the care of this patient on the date of this encounter to include pre-visit review of records, face-to-face time with the patient discussing conditions above, post visit ordering of testing, clinical documentation with the electronic health record, making appropriate referrals as documented, and communicating necessary information to the patient's healthcare team.     Bevelyn Ngo, NP 06/18/2023  9:12 AM

## 2023-07-01 ENCOUNTER — Encounter: Payer: Self-pay | Admitting: Physician Assistant

## 2023-07-01 ENCOUNTER — Other Ambulatory Visit (INDEPENDENT_AMBULATORY_CARE_PROVIDER_SITE_OTHER)

## 2023-07-01 ENCOUNTER — Ambulatory Visit: Payer: 59 | Admitting: Physician Assistant

## 2023-07-01 VITALS — BP 114/68 | HR 60 | Ht 64.0 in | Wt 151.4 lb

## 2023-07-01 DIAGNOSIS — R772 Abnormality of alphafetoprotein: Secondary | ICD-10-CM | POA: Diagnosis not present

## 2023-07-01 DIAGNOSIS — K7460 Unspecified cirrhosis of liver: Secondary | ICD-10-CM

## 2023-07-01 DIAGNOSIS — Z860100 Personal history of colon polyps, unspecified: Secondary | ICD-10-CM

## 2023-07-01 DIAGNOSIS — R932 Abnormal findings on diagnostic imaging of liver and biliary tract: Secondary | ICD-10-CM

## 2023-07-01 DIAGNOSIS — Z1159 Encounter for screening for other viral diseases: Secondary | ICD-10-CM

## 2023-07-01 LAB — GAMMA GT: GGT: 22 U/L (ref 7–51)

## 2023-07-01 LAB — HEPATIC FUNCTION PANEL
ALT: 17 U/L (ref 0–35)
AST: 18 U/L (ref 0–37)
Albumin: 4.2 g/dL (ref 3.5–5.2)
Alkaline Phosphatase: 108 U/L (ref 39–117)
Bilirubin, Direct: 0 mg/dL (ref 0.0–0.3)
Total Bilirubin: 0.3 mg/dL (ref 0.2–1.2)
Total Protein: 6.6 g/dL (ref 6.0–8.3)

## 2023-07-01 LAB — BASIC METABOLIC PANEL WITH GFR
BUN: 15 mg/dL (ref 6–23)
CO2: 34 meq/L — ABNORMAL HIGH (ref 19–32)
Calcium: 9.4 mg/dL (ref 8.4–10.5)
Chloride: 102 meq/L (ref 96–112)
Creatinine, Ser: 0.76 mg/dL (ref 0.40–1.20)
GFR: 82.68 mL/min (ref >60.00)
Glucose, Bld: 98 mg/dL (ref 70–99)
Potassium: 4 meq/L (ref 3.5–5.1)
Sodium: 141 meq/L (ref 135–145)

## 2023-07-01 LAB — CBC WITH DIFFERENTIAL/PLATELET
Basophils Absolute: 0.1 10*3/uL (ref 0.0–0.1)
Basophils Relative: 1 % (ref 0.0–3.0)
Eosinophils Absolute: 0.1 10*3/uL (ref 0.0–0.7)
Eosinophils Relative: 2.3 % (ref 0.0–5.0)
HCT: 42.8 % (ref 36.0–46.0)
Hemoglobin: 14.3 g/dL (ref 12.0–15.0)
Lymphocytes Relative: 21.5 % (ref 12.0–46.0)
Lymphs Abs: 1.3 10*3/uL (ref 0.7–4.0)
MCHC: 33.5 g/dL (ref 30.0–36.0)
MCV: 94.7 fl (ref 78.0–100.0)
Monocytes Absolute: 0.5 10*3/uL (ref 0.1–1.0)
Monocytes Relative: 7.4 % (ref 3.0–12.0)
Neutro Abs: 4.1 10*3/uL (ref 1.4–7.7)
Neutrophils Relative %: 67.8 % (ref 43.0–77.0)
Platelets: 207 10*3/uL (ref 150.0–400.0)
RBC: 4.52 Mil/uL (ref 3.87–5.11)
RDW: 12.5 % (ref 11.5–15.5)
WBC: 6.1 10*3/uL (ref 4.0–10.5)

## 2023-07-01 LAB — PROTIME-INR
INR: 1 ratio (ref 0.8–1.0)
Prothrombin Time: 10.7 s (ref 9.6–13.1)

## 2023-07-01 NOTE — Patient Instructions (Addendum)
 Your provider has requested that you go to the basement level for lab work before leaving today. Press "B" on the elevator. The lab is located at the first door on the left as you exit the elevator.  Need labs of CBC/LFTs every 6 months  You have been scheduled for an abdominal ultrasound at [facility] (1st floor of hospital) on 07/08/23 at 8:00am. Please arrive 30 minutes prior to your appointment for registration. Make certain not to have anything to eat or drink after midnight prior to your appointment. Should you need to reschedule your appointment, please contact radiology at [phone]. This test typically takes about 30 minutes to perform.  Metabolic dysfunction associated seatohepatitis  Now the leading cause of liver failure in the [place].  It is normally from such risk factors as obesity, diabetes, insulin resistance, high cholesterol, or metabolic syndrome.  The only definitive therapy is weight loss and exercise.   Suggest walking 20-30 mins daily.  Decreasing carbohydrates, increasing veggies.    Fatty Liver Fatty liver is the accumulation of fat in liver cells. It is also called hepatosteatosis or steatohepatitis. It is normal for your liver to contain some fat. If fat is more than 5 to 10% of your liver's weight, you have fatty liver.  There are often no symptoms (problems) for years while damage is still occurring. People often learn about their fatty liver when they have medical tests for other reasons. Fat can damage your liver for years or even decades without causing problems. When it becomes severe, it can cause fatigue, weight loss, weakness, and confusion. This makes you more likely to develop more serious liver problems. The liver is the largest organ in the body. It does a lot of work and often gives no warning signs when it is sick until late in a disease. The liver has many important jobs including: Breaking down foods. Storing vitamins, iron, and  other minerals. Making proteins. Making bile for food digestion. Breaking down many products including medications, alcohol and some poisons.  PROGNOSIS  Fatty liver may cause no damage or it can lead to an inflammation of the liver. This is, called steatohepatitis.  Over time the liver may become scarred and hardened. This condition is called cirrhosis. Cirrhosis is serious and may lead to liver failure or cancer. NASH is one of the leading causes of cirrhosis. About 10-20% of Americans have fatty liver and a smaller 2-5% has NASH.  TREATMENT  Weight loss, fat restriction, and exercise in overweight patients produces inconsistent results but is worth trying. Good control of diabetes may reduce fatty liver. Eat a balanced, healthy diet. Increase your physical activity. There are no medical or surgical treatments for a fatty liver or NASH, but improving your diet and increasing your exercise may help prevent or reverse some of the damage.

## 2023-07-01 NOTE — Progress Notes (Signed)
 07/01/2023 GRASIELA Hicks [id number] [birth year]  Referring provider: Shade Flood, MD Primary GI doctor: Dr. Leonides Schanz  ASSESSMENT AND PLAN:     Abnormal CT of liver suspicious for cirrhosis 04/06/2023 CT abdomen pelvis without contrast for left abdominal pain showed nodular hepatic contour suspicious for cirrhosis Spleen unremarkable, gallbladder unremarkable. Remote history alkaline phosphatase elevation 143 Negative ANA, anti-DNA ETOH 1-2 drink wine a week for years but no significant history, BMI is 25, no family history of liver issues, no smoking or IV drug use.  -will get INR to evaluate for function, no HE on exam - normal platelets no sign of portal HTN - will get elasography to evaluate for fibrosis -Will order  acute hepatitis panel --We will also proceed with AMA, ANA,IGG, and ASMA, ferritin and iron panel, IGA, TTG to rule out etiologies of chronic liver disease, if negative likely fatty liver - need CBC/LFTs checked every 6 months  Morbid obesity  Body mass index is 25.98 kg/m.  -Patient has been advised to make an attempt to improve diet and exercise patterns to aid in weight loss. -Recommended diet heavy in fruits and veggies and low in animal meats, cheeses, and dairy products, appropriate calorie intake  History of colonoscopy 2022  benign polyps recall 10 years with Dr. Elie Confer test -2021  Lung nodules  being followed by primary care Non smoker  Possible autoimmune disease Sjogren's/Raynaud's/FM Follows with Dr. Titus Dubin twice a day Not on any medication  Patient Care Team: Shade Flood, MD as PCP - General (Family Medicine) Silverio Lay, MD (Obstetrics and Gynecology) Pollyann Savoy, MD (Rheumatology)  HISTORY OF PRESENT ILLNESS: Discussed the use of AI scribe software for clinical note transcription with the patient, who gave verbal consent to proceed.  History of Present Illness   Alexis Hicks is a 63 year  old female with autoimmune disorders who presents with concerns about liver abnormalities found on imaging.  She underwent a CT scan of the abdomen and pelvis on April 06, 2023, due to abdominal pain. The scan revealed nodular hepatic contours suspicious for cirrhosis, although no hernias or masses were identified. The abdominal pain, initially suspected to be a hernia, has since resolved and is believed to have been a strained muscle.  She has a history of autoimmune disorders, including Sjogren's syndrome and fibromyalgia, and follows with Dr. Denton Meek for these conditions. She is not currently on any medication for her autoimmune conditions and also experiences Raynaud's phenomenon.  No current abdominal pain, dark or bloody stools, yellowing of the eyes or skin, or swelling in the legs. She mentions a past increase in cholesterol, which she is actively working to reduce.  Her social history includes minimal alcohol consumption, with one to two drinks per week in the past, and no alcohol in the past year except during a trip to [place]. She denies any history of smoking, drug use, or IV drug use. There is no family history of liver issues.      She  reports that she has never smoked. She has been exposed to tobacco smoke. She has never used smokeless tobacco. She reports current alcohol use. She reports that she does not use drugs.  RELEVANT GI HISTORY, IMAGING AND LABS: Results   LABS PLT: Normal  RADIOLOGY CT abdomen pelvis: No hernias, no masses, nodular hepatic contours suspicious for cirrhosis (04/06/2023) CT chest: Lung nodules, normal esophagus, small hepatic cyst (04/06/2023)      CBC    Component  Value Date/Time   WBC 6.0 10/13/2022 0829   RBC 4.60 10/13/2022 0829   HGB 14.2 10/13/2022 0829   HCT 43.1 10/13/2022 0829   PLT 234.0 10/13/2022 0829   MCV 93.6 10/13/2022 0829   MCH 31.9 02/19/2022 1545   MCHC 33.0 10/13/2022 0829   RDW 12.6 10/13/2022 0829   LYMPHSABS 0.9  10/13/2022 0829   MONOABS 0.5 10/13/2022 0829   EOSABS 0.2 10/13/2022 0829   BASOSABS 0.0 10/13/2022 0829   Recent Labs    10/13/22 0829  HGB 14.2    CMP     Component Value Date/Time   NA 139 05/04/2023 0806   K 3.8 05/04/2023 0806   CL 103 05/04/2023 0806   CO2 30 05/04/2023 0806   GLUCOSE 74 05/04/2023 0806   BUN 14 05/04/2023 0806   CREATININE 0.72 05/04/2023 0806   CREATININE 0.77 02/19/2022 1545   CALCIUM 8.9 05/04/2023 0806   PROT 6.2 05/04/2023 0806   ALBUMIN 4.0 05/04/2023 0806   AST 19 05/04/2023 0806   ALT 17 05/04/2023 0806   ALKPHOS 109 05/04/2023 0806   BILITOT 0.4 05/04/2023 0806      Latest Ref Rng & Units 05/04/2023    8:06 AM 10/13/2022    8:29 AM 02/19/2022    3:45 PM  Hepatic Function  Total Protein 6.0 - 8.3 g/dL 6.2  7.1  7.1    7.3   Albumin 3.5 - 5.2 g/dL 4.0  4.1    AST 0 - 37 U/L 19  19  20    ALT 0 - 35 U/L 17  15  20    Alk Phosphatase 39 - 117 U/L 109  105    Total Bilirubin 0.2 - 1.2 mg/dL 0.4  0.4  0.3       Current Medications:     Current Outpatient Medications (Respiratory):    albuterol (VENTOLIN HFA) 108 (90 Base) MCG/ACT inhaler, Inhale 1-2 puffs into the lungs every 6 (six) hours as needed for wheezing or shortness of breath.   fluticasone (FLONASE) 50 MCG/ACT nasal spray, Place 2 sprays into both nostrils daily.   Current Outpatient Medications (Hematological):    Cyanocobalamin (VITAMIN B 12 PO), Take by mouth.  Current Outpatient Medications (Other):    ascorbic Acid (VITAMIN C) 500 MG CPCR, as needed. Pt takes half of the dosage listed   Biotin 1 MG CAPS, Take by mouth.   CALCIUM PO, Take 350 mg by mouth daily.   cholecalciferol (VITAMIN D) 1000 units tablet, Take 2,000 Units by mouth daily.    cycloSPORINE (RESTASIS) 0.05 % ophthalmic emulsion, 1 drop daily.   LYSINE PO, Take by mouth daily.   triamcinolone cream (KENALOG) 0.1 %, triamcinolone acetonide 0.1 % topical cream  APPLY TO SKIN TWICE DAILY X 14 DAYS    Zinc 10 MG LOZG, Use as directed in the mouth or throat as needed.  Medical History:  Past Medical History:  Diagnosis Date   Chronically dry eyes, right    Dry mouth    DUB (dysfunctional uterine bleeding)    Fibromyalgia    Osteoporosis 04/2018   Dr. Layne Benton   Pneumonia 04/2021   Raynaud's syndrome    Allergies: No Known Allergies   Surgical History:  She  has a past surgical history that includes Salpingectomy and Mole removal (12/02/2021). Family History:  Her family history includes Arthritis in her mother; Breast cancer in her paternal aunt and paternal grandmother; Dementia in her mother; Depression in her maternal grandmother; Diabetes in  her brother; Gallstones in her daughter; Heart disease in her father; High Cholesterol in her brother; Hypertension in her brother and maternal grandmother; Kidney disease in her maternal grandfather; Metabolic syndrome in her brother; Osteoporosis in her mother; Skin cancer in her father; Stroke in her brother and maternal grandmother; Uterine cancer in her maternal aunt.  REVIEW OF SYSTEMS  : All other systems reviewed and negative except where noted in the History of Present Illness.  PHYSICAL EXAM: BP 114/68 (Cuff Size: Normal)   Pulse 60   Ht 5\' 4"  (1.626 m)   Wt 151 lb 6 oz (68.7 kg)   SpO2 98%   BMI 25.98 kg/m  Physical Exam   GENERAL APPEARANCE: Well nourished, in no apparent distress. HEENT: No cervical lymphadenopathy, unremarkable thyroid, sclerae anicteric, conjunctiva pink. RESPIRATORY: Respiratory effort normal, BS equal bilateral without rales, rhonchi, wheezing. CARDIO: RRR with no MRGs, peripheral pulses intact. ABDOMEN: Soft, non distended, active bowel sounds in all 4 quadrants, no tenderness to palpation, no rebound, no mass appreciated, spleen normal. RECTAL: Declines. MUSCULOSKELETAL: Full ROM, normal gait, without edema. SKIN: Dry, intact without rashes or lesions. No jaundice. NEURO: Alert, oriented, no focal  deficits. No astrexis PSYCH: Cooperative, normal mood and affect.      Doree Albee, PA-C 3:16 PM

## 2023-07-01 NOTE — Progress Notes (Signed)
 I agree with the assessment and plan as outlined by Ms. Steffanie Dunn.

## 2023-07-02 LAB — IBC + FERRITIN
Ferritin: 66.1 ng/mL (ref 10.0–291.0)
Iron: 66 ug/dL (ref 42–145)
Saturation Ratios: 19.6 % — ABNORMAL LOW (ref 20.0–50.0)
TIBC: 337.4 ug/dL (ref 250.0–450.0)
Transferrin: 241 mg/dL (ref 212.0–360.0)

## 2023-07-02 LAB — TSH: TSH: 2.28 u[IU]/mL (ref 0.35–5.50)

## 2023-07-05 LAB — HEPATITIS B SURFACE ANTIGEN: Hepatitis B Surface Ag: NONREACTIVE

## 2023-07-05 LAB — IGG: IgG (Immunoglobin G), Serum: 1045 mg/dL (ref 600–1540)

## 2023-07-05 LAB — TISSUE TRANSGLUTAMINASE, IGA: (tTG) Ab, IgA: <1 U/mL

## 2023-07-05 LAB — AFP TUMOR MARKER: AFP-Tumor Marker: 6.7 ng/mL — ABNORMAL HIGH

## 2023-07-05 LAB — ANTI-SMOOTH MUSCLE ANTIBODY, IGG: Actin (Smooth Muscle) Antibody (IGG): <20 U (ref <20)

## 2023-07-05 LAB — MITOCHONDRIAL ANTIBODIES: Mitochondrial M2 Ab, IgG: <=20 U (ref <=20.0)

## 2023-07-05 LAB — HEPATITIS A ANTIBODY, TOTAL: Hepatitis A AB,Total: NONREACTIVE

## 2023-07-05 LAB — IGA: Immunoglobulin A: 324 mg/dL — ABNORMAL HIGH (ref 70–320)

## 2023-07-05 LAB — HEPATITIS C ANTIBODY: Hepatitis C Ab: NONREACTIVE

## 2023-07-05 LAB — HEPATITIS B SURFACE ANTIBODY,QUALITATIVE: Hep B S Ab: NONREACTIVE

## 2023-07-06 NOTE — Addendum Note (Signed)
 Addended by: Quentin Mulling on: 07/06/2023 11:30 AM   Modules accepted: Orders

## 2023-07-07 ENCOUNTER — Telehealth: Payer: Self-pay | Admitting: Physician Assistant

## 2023-07-07 DIAGNOSIS — R772 Abnormality of alphafetoprotein: Secondary | ICD-10-CM

## 2023-07-07 DIAGNOSIS — K7460 Unspecified cirrhosis of liver: Secondary | ICD-10-CM

## 2023-07-07 NOTE — Telephone Encounter (Signed)
 Ok to delay MRI until patient is ready?

## 2023-07-07 NOTE — Telephone Encounter (Signed)
 Inbound call from patient requesting to cancel MRI for 4/9 due to insurance not covering appointment. Patient is requesting to know if she is able to post pone MRI until  August or if MRI is urgently needed. Requesting a call back. Please advise, thank you.

## 2023-07-08 ENCOUNTER — Ambulatory Visit (HOSPITAL_COMMUNITY)
Admission: RE | Admit: 2023-07-08 | Discharge: 2023-07-08 | Disposition: A | Discharge status: Home / Self Care | Source: Ambulatory Visit | Attending: Physician Assistant | Admitting: Physician Assistant

## 2023-07-08 DIAGNOSIS — K7460 Unspecified cirrhosis of liver: Secondary | ICD-10-CM | POA: Insufficient documentation

## 2023-07-08 DIAGNOSIS — R932 Abnormal findings on diagnostic imaging of liver and biliary tract: Secondary | ICD-10-CM | POA: Insufficient documentation

## 2023-07-08 NOTE — Telephone Encounter (Signed)
 CT order in epic. Secure message sent to pre authorization team to determine coverage.

## 2023-07-08 NOTE — Telephone Encounter (Signed)
 I have cancelled the MRI appt. It looks like they did not cancel the [place] elastography and patient completed it this morning. Are we still proceeding with CT liver protocol at this time? Or do we need to wait for [place] elastography results?  I called the patient and she is aware that MRI has been cancelled and we plan to see if CT scan will be better covered by her insurance.

## 2023-07-09 NOTE — Telephone Encounter (Signed)
 Secure staff message sent to radiology scheduling to contact patient to set up CT appt.

## 2023-07-09 NOTE — Telephone Encounter (Signed)
 Charyl Bigger, Pearletha Forge, [place], RN; [place], [place] I; [place], April D No auth needed WUJ#:[id number]   ----- Message ----- From: Marisa Sprinkles, RN Sent: 07/08/2023   2:16 PM EDT To: Marylynn Pearson; April D McPeak; * Subject: CT auth                                        Hi everyone, Can someone let me know the coverage for this patient's CT scan? It is not scheduled yet. We cancelled MRI because it was not covered. Thanks for any help, [place], Charity fundraiser

## 2023-07-12 ENCOUNTER — Encounter (HOSPITAL_COMMUNITY): Payer: Self-pay

## 2023-07-12 ENCOUNTER — Ambulatory Visit (HOSPITAL_COMMUNITY)
Admission: RE | Admit: 2023-07-12 | Discharge: 2023-07-12 | Disposition: A | Discharge status: Home / Self Care | Source: Ambulatory Visit | Attending: Physician Assistant | Admitting: Physician Assistant

## 2023-07-12 DIAGNOSIS — R772 Abnormality of alphafetoprotein: Secondary | ICD-10-CM | POA: Insufficient documentation

## 2023-07-12 DIAGNOSIS — K7460 Unspecified cirrhosis of liver: Secondary | ICD-10-CM | POA: Diagnosis present

## 2023-07-12 MED ORDER — SODIUM CHLORIDE (PF) 0.9 % IJ SOLN
INTRAMUSCULAR | Status: AC
  Filled 2023-07-12: qty 50

## 2023-07-12 MED ORDER — IOHEXOL 300 MG/ML  SOLN
100.0000 mL | Freq: Once | INTRAMUSCULAR | Status: AC | PRN
  Administered 2023-07-12: 100 mL via INTRAVENOUS

## 2023-07-13 NOTE — Telephone Encounter (Signed)
CT scan completed yesterday.

## 2023-07-14 ENCOUNTER — Ambulatory Visit (HOSPITAL_COMMUNITY)

## 2023-07-20 ENCOUNTER — Telehealth: Payer: Self-pay | Admitting: Gastroenterology

## 2023-07-20 ENCOUNTER — Other Ambulatory Visit: Payer: Self-pay

## 2023-07-20 DIAGNOSIS — R772 Abnormality of alphafetoprotein: Secondary | ICD-10-CM

## 2023-07-20 DIAGNOSIS — K7460 Unspecified cirrhosis of liver: Secondary | ICD-10-CM

## 2023-07-20 DIAGNOSIS — R932 Abnormal findings on diagnostic imaging of liver and biliary tract: Secondary | ICD-10-CM

## 2023-07-20 DIAGNOSIS — K769 Liver disease, unspecified: Secondary | ICD-10-CM

## 2023-07-20 NOTE — Telephone Encounter (Signed)
 Called patient to discuss CT scan showing hypervascular lesion on the liver.  Due to patient's setting of chronic liver disease and elevated AFP it is recommended patient do MRI with and without contrast.  We will try to get scheduled before patient leaves for her trip on Monday. Also discussed elastography showing K PA 6.5 indicating mild scarring F1.  Will order MRI and advised patient to refrain from alcohol.

## 2023-07-20 NOTE — Addendum Note (Signed)
 Addended by: Kemaya Dorner N on: 07/20/2023 04:29 PM   Modules accepted: Orders

## 2023-07-24 ENCOUNTER — Ambulatory Visit (HOSPITAL_COMMUNITY)
Admission: RE | Admit: 2023-07-24 | Discharge: 2023-07-24 | Disposition: A | Discharge status: Home / Self Care | Source: Ambulatory Visit | Attending: Physician Assistant | Admitting: Physician Assistant

## 2023-07-24 DIAGNOSIS — R932 Abnormal findings on diagnostic imaging of liver and biliary tract: Secondary | ICD-10-CM | POA: Diagnosis present

## 2023-07-24 DIAGNOSIS — R772 Abnormality of alphafetoprotein: Secondary | ICD-10-CM | POA: Diagnosis present

## 2023-07-24 DIAGNOSIS — K769 Liver disease, unspecified: Secondary | ICD-10-CM | POA: Insufficient documentation

## 2023-07-24 DIAGNOSIS — K7460 Unspecified cirrhosis of liver: Secondary | ICD-10-CM | POA: Insufficient documentation

## 2023-07-24 MED ORDER — GADOBUTROL 1 MMOL/ML IV SOLN
6.0000 mL | Freq: Once | INTRAVENOUS | Status: AC | PRN
  Administered 2023-07-24: 6 mL via INTRAVENOUS

## 2023-07-26 ENCOUNTER — Telehealth: Payer: Self-pay | Admitting: Family Medicine

## 2023-07-26 NOTE — Telephone Encounter (Signed)
 Paperwork completed and placed in fax bin at back nurse station

## 2023-07-26 NOTE — Telephone Encounter (Signed)
 [facility] sent over a form that needs to be reviewed, signed and sent back to its designed area. Fax [phone]. Documents are placed in the providers box. Please advise,  Thanks

## 2023-07-26 NOTE — Telephone Encounter (Signed)
 Placed in your sign folder

## 2023-07-27 NOTE — Telephone Encounter (Signed)
 Faxed back.

## 2023-07-28 ENCOUNTER — Other Ambulatory Visit: Payer: Self-pay

## 2023-07-28 DIAGNOSIS — R160 Hepatomegaly, not elsewhere classified: Secondary | ICD-10-CM

## 2023-07-28 NOTE — Telephone Encounter (Signed)
 Patient called back, was able to go over results of MRCP with her.  Patient is currently in [place] on a trip will return May 7, please call patient back to schedule liver biopsy with interventional radiology for liver mass. All other questions were answered.

## 2023-07-28 NOTE — Telephone Encounter (Signed)
 US  liver biopsy order in epic. Secure staff message sent to radiology scheduling to contact patient to schedule liver biopsy.

## 2023-07-29 NOTE — Progress Notes (Unsigned)
 Alexis Hopkins, MD  [facility] PROCEDURE / BIOPSY REVIEW Date: 07/29/23  Requested Biopsy site: Right lobe liver mass, characterized as LIRADS 4 Reason for request: as above Imaging review: Best seen on CT 07/12/23 and MR 07/24/23  Decision: Approved Imaging modality to perform: Ultrasound (consider CEUS) Schedule with: Moderate Sedation Schedule for: Any VIR  Additional comments: none  Please contact me with questions, concerns, or if issue pertaining to this request arise.  Alexis Hopkins, MD [facility]       Previous Messages    ----- Message ----- From: Derrell Flight Sent: 07/28/2023   1:17 PM EDT To: Derrell Flight; Ir Procedure Requests  Procedure :US  BIOPSY (LIVER)  Reason:liver mass  History :MR ABDOMEN W WO CONTRAST,CT ABDOMEN PELVIS W CONTRAST ,US  ELASTOGRAPHY LIVER,CT CHEST WO CONTRAST,DG BONE DENSITY (DXA),CT ABDOMEN PELVIS WO CONTRAST  Provider:Collier, Birdia Buhl, PA-C  Provider [phone]

## 2023-08-31 ENCOUNTER — Encounter: Payer: Self-pay | Admitting: Physician Assistant

## 2023-08-31 ENCOUNTER — Ambulatory Visit: Admitting: Physician Assistant

## 2023-08-31 VITALS — BP 120/62 | HR 59 | Ht 64.0 in | Wt 150.0 lb

## 2023-08-31 DIAGNOSIS — R935 Abnormal findings on diagnostic imaging of other abdominal regions, including retroperitoneum: Secondary | ICD-10-CM | POA: Diagnosis not present

## 2023-08-31 DIAGNOSIS — R160 Hepatomegaly, not elsewhere classified: Secondary | ICD-10-CM | POA: Diagnosis not present

## 2023-08-31 DIAGNOSIS — R932 Abnormal findings on diagnostic imaging of liver and biliary tract: Secondary | ICD-10-CM

## 2023-08-31 DIAGNOSIS — R772 Abnormality of alphafetoprotein: Secondary | ICD-10-CM | POA: Diagnosis not present

## 2023-08-31 DIAGNOSIS — K7460 Unspecified cirrhosis of liver: Secondary | ICD-10-CM

## 2023-08-31 NOTE — Progress Notes (Signed)
 08/31/2023 HAYDEN MABIN [id number] Aug 27, 1960  Referring provider: Benjiman Bras, MD Primary GI doctor: Dr. 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 07/08/2023 US  elastography kPA 6.5 07/24/2023 MR abdomen with and without contrast shows arterially hyperenhancing lesion 2.7 x 2.2 cm no evidence of washout or capsular enhancement category 4 lesion, mildly coarse contour liver no ascites or splenomegaly Remote history alkaline phosphatase elevation 143 MELD 3.0: 7 at 07/01/2023   No evidence of ascites Normal platelets no history of EGD no signs of portal hypertension, no need for EGD at this time No signs of hepatic encephalopathy - uncertain if this is severe fatty liver versus early cirrhosis with kPA only 6.5 -Needs repeat INR and HCC screening in Sept pending liver biopsy, will message IR to see if they can also get liver biopsy in addition to the lesion -Pending liver biopsy 09/14/2023 - discussed diet, consider referral to dietician  Liver mass with elevated AFP Pending liver biopsy 09/14/2023  History of colonoscopy 2022  benign polyps recall 10 years with Dr. Tova Fresh Recall 2032  Lung nodules  being followed by primary care Non smoker  Possible autoimmune disease Sjogren's/Raynaud's/FM Follows with Dr. Georgiann Kirsch  Not on any medication, unable to see notes.  Patient Care Team: Benjiman Bras, MD as PCP - General (Family Medicine) Ona Bidding, MD (Obstetrics and Gynecology) Nicholas Bari, MD (Rheumatology)  HISTORY OF PRESENT ILLNESS: Discussed the use of AI scribe software for clinical note transcription with the patient, who gave verbal consent to  proceed.  History of Present Illness   ADREA SHERPA is a 63 year old female who presents for follow-up evaluation of liver lesions and potential cirrhosis.  She returns for follow-up regarding liver abnormalities initially identified on a CT scan. A comprehensive hepatocellular workup was negative for autoimmune causes of liver disease. She has a history of minimal alcohol consumption, primarily during college, and denies current heavy drinking.  A CT scan and MRI indicated suspicion of coarse liver contour, and an elastography showed a low probability for advanced liver disease with a stiffness measurement of 6.5. An MRI revealed a 2.7 by 2.2 cm lesion on the liver, with an elevated alpha-fetoprotein (AFP) level of 6.8, slightly above the normal range.  She experiences occasional leg swelling after travel. No abdominal swelling, significant leg swelling, or symptoms of portal hypertension.  She is currently not taking aspirin or fish oil. She follows a diet that includes olive oil, fish, nuts, and avocados, aligning with a Mediterranean diet, and avoids processed foods and breads.      She  reports that she has never smoked. She has been exposed to tobacco smoke. She has never used smokeless tobacco. She reports current alcohol use. She reports that she does not use drugs.  RELEVANT GI HISTORY, IMAGING AND LABS: Results   LABS AFP: 6.8 ng/mL  RADIOLOGY CT Abdomen: Suspicion for coarse contour of the liver (07/01/2023) MRI Abdomen: 2.7 x 2.2 cm lesion on the liver; suspicion for coarse contour of the liver  DIAGNOSTIC Elastography: 6.5 kPa     MRI liver 07/24/23 IMPRESSION: 1. In the posterior liver dome, hepatic segment VII, arterially hyperenhancing lesion measuring 2.7 x 2.2 cm. No evidence of washout or capsular  enhancement by MR. Imaging characteristics are suggestive of a focal nodular hyperplasia, however in the high risk setting of cirrhosis this is characterized as  a LI-RADS category 4 lesion, and hepatocellular carcinoma is the diagnosis of exclusion. 2. Mildly coarse contour of the liver, in keeping with stated diagnosis of cirrhosis. No ascites or splenomegaly.   Aortic Atherosclerosis (ICD10-I70.0). CBC    Component Value Date/Time   WBC 6.1 07/01/2023 1432   RBC 4.52 07/01/2023 1432   HGB 14.3 07/01/2023 1432   HCT 42.8 07/01/2023 1432   PLT 207.0 07/01/2023 1432   MCV 94.7 07/01/2023 1432   MCH 31.9 02/19/2022 1545   MCHC 33.5 07/01/2023 1432   RDW 12.5 07/01/2023 1432   LYMPHSABS 1.3 07/01/2023 1432   MONOABS 0.5 07/01/2023 1432   EOSABS 0.1 07/01/2023 1432   BASOSABS 0.1 07/01/2023 1432   Recent Labs    10/13/22 0829 07/01/23 1432  HGB 14.2 14.3    CMP     Component Value Date/Time   NA 141 07/01/2023 1432   K 4.0 07/01/2023 1432   CL 102 07/01/2023 1432   CO2 34 (H) 07/01/2023 1432   GLUCOSE 98 07/01/2023 1432   BUN 15 07/01/2023 1432   CREATININE 0.76 07/01/2023 1432   CREATININE 0.77 02/19/2022 1545   CALCIUM 9.4 07/01/2023 1432   PROT 6.6 07/01/2023 1432   ALBUMIN 4.2 07/01/2023 1432   AST 18 07/01/2023 1432   ALT 17 07/01/2023 1432   ALKPHOS 108 07/01/2023 1432   BILITOT 0.3 07/01/2023 1432      Latest Ref Rng & Units 07/01/2023    2:32 PM 05/04/2023    8:06 AM 10/13/2022    8:29 AM  Hepatic Function  Total Protein 6.0 - 8.3 g/dL 6.6  6.2  7.1   Albumin 3.5 - 5.2 g/dL 4.2  4.0  4.1   AST 0 - 37 U/L 18  19  19    ALT 0 - 35 U/L 17  17  15    Alk Phosphatase 39 - 117 U/L 108  109  105   Total Bilirubin 0.2 - 1.2 mg/dL 0.3  0.4  0.4   Bilirubin, Direct 0.0 - 0.3 mg/dL 0.0         Latest Ref Rng & Units 07/01/2023    2:32 PM  Hepatitis C  AFP ng/mL 6.7     Current Medications:     Current Outpatient Medications (Respiratory):    albuterol  (VENTOLIN  HFA) 108 (90 Base) MCG/ACT inhaler, Inhale 1-2 puffs into the lungs every 6 (six) hours as needed for wheezing or shortness of breath.   fluticasone   (FLONASE ) 50 MCG/ACT nasal spray, Place 2 sprays into both nostrils daily.   Current Outpatient Medications (Hematological):    Cyanocobalamin (VITAMIN B 12 PO), Take by mouth.  Current Outpatient Medications (Other):    ascorbic Acid (VITAMIN C) 500 MG CPCR, as needed. Pt takes half of the dosage listed   Biotin 1 MG CAPS, Take by mouth.   CALCIUM PO, Take 350 mg by mouth daily.   cholecalciferol (VITAMIN D ) 1000 units tablet, Take 2,000 Units by mouth daily.    cycloSPORINE (RESTASIS) 0.05 % ophthalmic emulsion, 1 drop daily.   LYSINE PO, Take by mouth daily.   triamcinolone cream (KENALOG) 0.1 %, triamcinolone acetonide 0.1 % topical cream  APPLY TO SKIN TWICE DAILY X 14 DAYS   Zinc 10 MG LOZG, Use as directed in the mouth or throat as needed.  Medical History:  Past Medical  History:  Diagnosis Date   Chronically dry eyes, right    Dry mouth    DUB (dysfunctional uterine bleeding)    Fibromyalgia    Osteoporosis 04/2018   Dr. Monty App   Pneumonia 04/2021   Raynaud's syndrome    Allergies: No Known Allergies   Surgical History:  She  has a past surgical history that includes Salpingectomy and Mole removal (12/02/2021). Family History:  Her family history includes Arthritis in her mother; Breast cancer in her paternal aunt and paternal grandmother; Dementia in her mother; Depression in her maternal grandmother; Diabetes in her brother; Gallstones in her daughter; Heart disease in her father; High Cholesterol in her brother; Hypertension in her brother and maternal grandmother; Kidney disease in her maternal grandfather; Metabolic syndrome in her brother; Osteoporosis in her mother; Skin cancer in her father; Stroke in her brother and maternal grandmother; Uterine cancer in her maternal aunt.  REVIEW OF SYSTEMS  : All other systems reviewed and negative except where noted in the History of Present Illness.  PHYSICAL EXAM: BP 120/62   Pulse (!) 59   Ht 5\' 4"  (1.626 m)   Wt 150  lb (68 kg)   BMI 25.75 kg/m  Physical Exam   GENERAL APPEARANCE: Well nourished, in no apparent distress. HEENT: No cervical lymphadenopathy, unremarkable thyroid, sclerae anicteric, conjunctiva pink. RESPIRATORY: Respiratory effort normal, BS equal bilateral without rales, rhonchi, wheezing. CARDIO: RRR with no MRGs, peripheral pulses intact. ABDOMEN: Soft, non distended, active bowel sounds in all 4 quadrants, no tenderness to palpation, no rebound, no mass appreciated, spleen normal. RECTAL: Declines. MUSCULOSKELETAL: Full ROM, normal gait, without edema. SKIN: Dry, intact without rashes or lesions. No jaundice. NEURO: Alert, oriented, no focal deficits. No astrexis PSYCH: Cooperative, normal mood and affect.      Edmonia Gottron, PA-C 3:44 PM

## 2023-08-31 NOTE — Progress Notes (Signed)
 I agree with the assessment and plan as outlined by [name]. Agree with asking to see if IR can biopsy both the liver lesion and get a separate liver biopsy for fibrosis assessment

## 2023-08-31 NOTE — Patient Instructions (Signed)
 If your blood pressure at your visit was 140/90 or greater, please contact your primary care physician to follow up on this. ______________________________________________________  If you are age 65 or older, your body mass index should be between 23-30. Your Body mass index is 25.75 kg/m. If this is out of the aforementioned range listed, please consider follow up with your Primary Care Provider.  If you are age 29 or younger, your body mass index should be between 19-25. Your Body mass index is 25.75 kg/m. If this is out of the aformentioned range listed, please consider follow up with your Primary Care Provider.  ________________________________________________________  The [facility] providers would like to encourage you to use MYCHART to communicate with providers for non-urgent requests or questions.  Due to long hold times on the telephone, sending your provider a message by [facility] may be a faster and more efficient way to get a response.  Please allow 48 business hours for a response.  Please remember that this is for non-urgent requests.  _______________________________________________________  Due to recent changes in healthcare laws, you may see the results of your imaging and laboratory studies on MyChart before your provider has had a chance to review them.  We understand that in some cases there may be results that are confusing or concerning to you. Not all laboratory results come back in the same time frame and the provider may be waiting for multiple results in order to interpret others.  Please give us  48 hours in order for your provider to thoroughly review all the results before contacting the office for clarification of your results.  You have been scheduled for a follow up appointment on Thursday, 11-04-23 with Dr. Rosaline Coma at 2:30pm. Please arrive 10 minutes early for registration. If you need to reschedule or cancel this appointment please call [phone] as soon as possible.  Thank you.    Thank you for entrusting me with your care and for choosing  Gastroenterology, Santina Cull, P.A.-C

## 2023-09-01 ENCOUNTER — Telehealth: Payer: Self-pay | Admitting: Radiology

## 2023-09-01 ENCOUNTER — Telehealth: Payer: Self-pay | Admitting: Physician Assistant

## 2023-09-01 NOTE — Telephone Encounter (Signed)
 Received a call from Santina Cull (GI) regarding adding on core liver biopsy to patient's already scheduled liver lesion biopsy on 6/10. This was discussed with Dr. Harrison Lin who had approved her liver lesion biopsy. He is agreeable to adding on core liver biopsy.  Santina Cull informed.   Will plan for right lobe liver mass biopsy and core liver biopsy on 6/10 at [facility].   Electronically Signed: Cantrell Martus M Louis Gaw, PA-C 09/01/2023, 12:15 PM

## 2023-09-01 NOTE — Telephone Encounter (Signed)
 Discussed with radiology about adding on core liver biopsy to the liver lesion biopsy on 06/10. They will reach back out to me if this is able to be done and if the order needs to be altered.

## 2023-09-07 ENCOUNTER — Other Ambulatory Visit (HOSPITAL_COMMUNITY)

## 2023-09-09 LAB — HM MAMMOGRAPHY

## 2023-09-13 ENCOUNTER — Other Ambulatory Visit: Payer: Self-pay | Admitting: Diagnostic Radiology

## 2023-09-13 ENCOUNTER — Other Ambulatory Visit: Payer: Self-pay

## 2023-09-13 DIAGNOSIS — Z01818 Encounter for other preprocedural examination: Secondary | ICD-10-CM

## 2023-09-14 ENCOUNTER — Other Ambulatory Visit: Payer: Self-pay | Admitting: Physician Assistant

## 2023-09-14 ENCOUNTER — Other Ambulatory Visit: Payer: Self-pay

## 2023-09-14 ENCOUNTER — Ambulatory Visit (HOSPITAL_COMMUNITY)
Admission: RE | Admit: 2023-09-14 | Discharge: 2023-09-14 | Disposition: A | Discharge status: Home / Self Care | Source: Ambulatory Visit | Attending: Physician Assistant | Admitting: Physician Assistant

## 2023-09-14 DIAGNOSIS — R160 Hepatomegaly, not elsewhere classified: Secondary | ICD-10-CM | POA: Insufficient documentation

## 2023-09-14 DIAGNOSIS — Z01818 Encounter for other preprocedural examination: Secondary | ICD-10-CM | POA: Insufficient documentation

## 2023-09-14 LAB — CBC
HCT: 43.2 % (ref 36.0–46.0)
Hemoglobin: 14.2 g/dL (ref 12.0–15.0)
MCH: 31.5 pg (ref 26.0–34.0)
MCHC: 32.9 g/dL (ref 30.0–36.0)
MCV: 95.8 fL (ref 80.0–100.0)
Platelets: 203 10*3/uL (ref 150–400)
RBC: 4.51 MIL/uL (ref 3.87–5.11)
RDW: 11.9 % (ref 11.5–15.5)
WBC: 6.3 10*3/uL (ref 4.0–10.5)
nRBC: 0 % (ref 0.0–0.2)

## 2023-09-14 LAB — PROTIME-INR
INR: 1 (ref 0.8–1.2)
Prothrombin Time: 13.1 s (ref 11.4–15.2)

## 2023-09-14 MED ORDER — SULFUR HEXAFLUORIDE MICROSPH 60.7-25 MG IJ SUSR
INTRAMUSCULAR | Status: AC
  Filled 2023-09-14: qty 5

## 2023-09-14 MED ORDER — SULFUR HEXAFLUORIDE MICROSPH 60.7-25 MG IJ SUSR
INTRAMUSCULAR | Status: AC | PRN
  Administered 2023-09-14 (×2): 2.4 mL via INTRAVENOUS

## 2023-09-14 MED ORDER — FENTANYL CITRATE (PF) 100 MCG/2ML IJ SOLN
INTRAMUSCULAR | Status: AC
Start: 2023-09-14 — End: ?
  Filled 2023-09-14: qty 2

## 2023-09-14 MED ORDER — MIDAZOLAM HCL 2 MG/2ML IJ SOLN
INTRAMUSCULAR | Status: AC
Start: 2023-09-14 — End: ?
  Filled 2023-09-14: qty 2

## 2023-09-14 NOTE — Sedation Documentation (Signed)
 Removed patient IV and discharged with patient's husband. Nothing further needed at this time.

## 2023-09-14 NOTE — H&P (Signed)
 Chief Complaint:  Elevated liver enzymes concerning for MASH w/ associated lesion  Procedure: Liver biopsy  Referring Provider(s): Santina Cull, PA-C (GI)  Supervising Physician: Art Largo  Patient Status: [facility] - Out[facility]  History of Present Illness: Alexis Hicks is a 63 y.o. female with a history of likely liver cirrhosis with liver lesions. She has undergone a comprehensive hepatocellular workup that was negative for autoimmune causes of liver disease. Recent CT and MRI indicated suspicion of coarse liver contour likely due to cirrhosis. MR also identified a LI-RADS category 4 lesion. She continues to have elevated AFP levels. She was referred to IR for both liver lesion and core liver biopsy.  Patient is resting in bed. Admits to being anxious about the procedure, but is otherwise without complaints. NPO since midnight. INR pending. VSS. All questions and concerns answered at the bedside.   Patient is Full Code  Past Medical History:  Diagnosis Date   Chronically dry eyes, right    Dry mouth    DUB (dysfunctional uterine bleeding)    Fibromyalgia    Osteoporosis 04/2018   Dr. Monty App   Pneumonia 04/2021   Raynaud's syndrome     Past Surgical History:  Procedure Laterality Date   MOLE REMOVAL  12/02/2021   face   SALPINGECTOMY      Allergies: Patient has no known allergies.  Medications: Prior to Admission medications   Medication Sig Start Date End Date Taking? Authorizing Provider  albuterol  (VENTOLIN  HFA) 108 (90 Base) MCG/ACT inhaler Inhale 1-2 puffs into the lungs every 6 (six) hours as needed for wheezing or shortness of breath. 06/18/23  Yes Raejean Bullock, NP  ascorbic Acid (VITAMIN C) 500 MG CPCR as needed. Pt takes half of the dosage listed   Yes [provider]  CALCIUM PO Take 350 mg by mouth daily.   Yes [provider]  cholecalciferol (VITAMIN D ) 1000 units tablet Take 2,000 Units by mouth daily.    Yes [provider]  Cyanocobalamin (VITAMIN B 12 PO) Take by mouth.   Yes [provider]  cycloSPORINE (RESTASIS) 0.05 % ophthalmic emulsion 1 drop daily.   Yes [provider]  fluticasone  (FLONASE ) 50 MCG/ACT nasal spray Place 2 sprays into both nostrils daily. 05/12/23  Yes Benjiman Bras, MD  LYSINE PO Take by mouth daily.   Yes [provider]  Biotin 1 MG CAPS Take by mouth.    [provider]  triamcinolone cream (KENALOG) 0.1 % triamcinolone acetonide 0.1 % topical cream  APPLY TO SKIN TWICE DAILY X 14 DAYS    [provider]  Zinc 10 MG LOZG Use as directed in the mouth or throat as needed.    [provider]     Family History  Problem Relation Age of Onset   Arthritis Mother    Osteoporosis Mother    Dementia Mother    Skin cancer Father    Heart disease Father    Metabolic syndrome Brother    Diabetes Brother    Hypertension Brother    High Cholesterol Brother    Stroke Brother    Uterine cancer Maternal Aunt    Breast cancer Paternal Aunt    Depression Maternal Grandmother    Hypertension Maternal Grandmother    Stroke Maternal Grandmother    Kidney disease Maternal Grandfather    Breast cancer Paternal Grandmother    Gallstones Daughter     Social History   Socioeconomic History  Marital status: Married    Spouse name: Not on file   Number of children: Not on file   Years of education: Not on file   Highest education level: Not on file  Occupational History   Not on file  Tobacco Use   Smoking status: Never    Passive exposure: Past   Smokeless tobacco: Never  Vaping Use   Vaping status: Never Used  Substance and Sexual Activity   Alcohol use: Yes    Comment: rarely   Drug use: No   Sexual activity: Not Currently  Other Topics Concern   Not on file  Social History Narrative   Not on file   Social Drivers of Health   Financial Resource Strain: Not on file  Food Insecurity: Not on file   Transportation Needs: Not on file  Physical Activity: Not on file  Stress: Not on file  Social Connections: Not on file     Review of Systems Patient denies any headache, chest pain, shortness of breath, abdominal pain, N/V, or fever/chills. All other systems are negative.   Vital Signs: BP 133/77   Pulse 60   Temp 97.6 F (36.4 C) (Oral)   Resp 16   Ht 5\' 4"  (1.626 m)   Wt 145 lb (65.8 kg)   SpO2 96%   BMI 24.89 kg/m     Physical Exam Vitals reviewed.  Constitutional:      Appearance: Normal appearance.  HENT:     Head: Normocephalic and atraumatic.     Mouth/Throat:     Mouth: Mucous membranes are moist.     Pharynx: Oropharynx is clear.  Cardiovascular:     Rate and Rhythm: Normal rate and regular rhythm.     Heart sounds: Normal heart sounds.  Pulmonary:     Effort: Pulmonary effort is normal.     Breath sounds: Normal breath sounds.  Abdominal:     General: Abdomen is flat.     Palpations: Abdomen is soft.     Tenderness: There is no abdominal tenderness.  Musculoskeletal:        General: Normal range of motion.     Cervical back: Normal range of motion.  Skin:    General: Skin is warm and dry.  Neurological:     General: No focal deficit present.     Mental Status: She is alert and oriented to person, place, and time. Mental status is at baseline.  Psychiatric:        Mood and Affect: Mood normal.        Behavior: Behavior normal.        Judgment: Judgment normal.     Imaging: No results found.  Labs:  CBC: Recent Labs    10/13/22 0829 07/01/23 1432 09/14/23 1119  WBC 6.0 6.1 6.3  HGB 14.2 14.3 14.2  HCT 43.1 42.8 43.2  PLT 234.0 207.0 203    COAGS: Recent Labs    07/01/23 1432  INR 1.0    BMP: Recent Labs    10/13/22 0829 05/04/23 0806 07/01/23 1432  NA 140 139 141  K 3.9 3.8 4.0  CL 105 103 102  CO2 27 30 34*  GLUCOSE 87 74 98  BUN 12 14 15   CALCIUM 9.4 8.9 9.4  CREATININE 0.81 0.72 0.76    LIVER FUNCTION  TESTS: Recent Labs    10/13/22 0829 05/04/23 0806 07/01/23 1432  BILITOT 0.4 0.4 0.3  AST 19 19 18   ALT 15 17 17   ALKPHOS 105  109 108  PROT 7.1 6.2 6.6  ALBUMIN 4.1 4.0 4.2    TUMOR MARKERS: Recent Labs    07/01/23 1432  AFPTM 6.7*    Assessment and Plan:  Cirrhosis with negative hepatocellular workup, elevated AFP, and liver lesions: DEONDREA AGUADO is a 63 y.o. female with a history of cirrhosis with negative hepatocellular workup and elevated AFP who presents to [facility] Interventional Radiology department for an image-guided liver biopsy with Dr. Enos Harts on 09/14/23. Procedure to be performed under moderate sedation.  Risks and benefits of liver biopsy was discussed with the patient and/or patient's family including, but not limited to bleeding, infection, damage to adjacent structures or low yield requiring additional tests.  All of the questions were answered and there is agreement to proceed.  Consent signed and in chart.   Thank you for allowing our service to participate in AKEYLAH HENDEL 's care.    Electronically Signed: Rhea Kaelin M Kathi Dohn, PA-C   09/14/2023, 12:35 PM     I spent a total of  40 Minutes in face to face in clinical consultation, greater than 50% of which was counseling/coordinating care for liver biopsy.

## 2023-09-14 NOTE — Procedures (Signed)
 Vascular and Interventional Radiology Procedure Note  Patient: Alexis Hicks DOB: 1960-10-16 Medical Record Number: [id number] Note Date/Time: 09/14/23 1:02 PM   Performing Physician: Art Largo, MD Assistant(s): None  Diagnosis: R liver mass, Q FNH vs HCC   Procedure:  CONTRAST-ENHANCED ULTRASOUND OF LIVER *Aborted* LIVER MASS BIOPSY  Anesthesia: None Complications: None Estimated Blood Loss: Minimal Specimens: Sent for None  Findings:  CE-US  with enhancing liver mass, and no washout. Liver mass at posterior superior R hepatic lobe, no safe window for biopsy. Discussed w the Pt and she deferred biopsy at this time.   See detailed procedure note with images in PACS. The patient tolerated the procedure well without incident or complication and was returned to Recovery in stable condition.    Art Largo, MD Vascular and Interventional Radiology [facility] Radiology   Pager. [phone] Clinic. [phone]

## 2023-09-14 NOTE — Sedation Documentation (Signed)
 Procedure aborted. Dr. Darylene Epley and patient decided not to proceed with biopsy.

## 2023-09-15 ENCOUNTER — Ambulatory Visit: Payer: Self-pay | Admitting: Physician Assistant

## 2023-09-15 DIAGNOSIS — R932 Abnormal findings on diagnostic imaging of liver and biliary tract: Secondary | ICD-10-CM

## 2023-09-15 DIAGNOSIS — R772 Abnormality of alphafetoprotein: Secondary | ICD-10-CM

## 2023-09-15 DIAGNOSIS — R160 Hepatomegaly, not elsewhere classified: Secondary | ICD-10-CM

## 2023-09-23 ENCOUNTER — Ambulatory Visit (HOSPITAL_BASED_OUTPATIENT_CLINIC_OR_DEPARTMENT_OTHER)

## 2023-09-27 ENCOUNTER — Ambulatory Visit (HOSPITAL_BASED_OUTPATIENT_CLINIC_OR_DEPARTMENT_OTHER)
Admission: RE | Admit: 2023-09-27 | Discharge: 2023-09-27 | Disposition: A | Discharge status: Home / Self Care | Source: Ambulatory Visit | Attending: Acute Care | Admitting: Acute Care

## 2023-09-27 DIAGNOSIS — R911 Solitary pulmonary nodule: Secondary | ICD-10-CM | POA: Insufficient documentation

## 2023-10-11 ENCOUNTER — Other Ambulatory Visit (HOSPITAL_BASED_OUTPATIENT_CLINIC_OR_DEPARTMENT_OTHER): Payer: Self-pay

## 2023-10-11 MED ORDER — COMIRNATY 30 MCG/0.3ML IM SUSY
0.3000 mL | PREFILLED_SYRINGE | Freq: Once | INTRAMUSCULAR | 0 refills | Status: AC
  Filled 2023-10-11: qty 0.3, 1d supply, fill #0

## 2023-10-22 ENCOUNTER — Telehealth: Payer: Self-pay

## 2023-10-22 NOTE — Telephone Encounter (Signed)
 Pt is active on mychart, reminder message sent to patient & radiology schedulers notified.

## 2023-10-22 NOTE — Telephone Encounter (Signed)
-----   Message from Nurse Sheffield B sent at 09/15/2023 12:02 PM EDT ----- Regarding: MR liver and labs MR liver and AFP due - orders are in epic Notify patient and radiology scheduling

## 2023-10-25 ENCOUNTER — Encounter: Payer: Self-pay | Admitting: Student in an Organized Health Care Education/Training Program

## 2023-10-25 ENCOUNTER — Ambulatory Visit: Admitting: Student in an Organized Health Care Education/Training Program

## 2023-10-25 VITALS — BP 120/72 | HR 60 | Wt 150.0 lb

## 2023-10-25 DIAGNOSIS — W57XXXA Bitten or stung by nonvenomous insect and other nonvenomous arthropods, initial encounter: Secondary | ICD-10-CM | POA: Diagnosis not present

## 2023-10-25 DIAGNOSIS — S70362A Insect bite (nonvenomous), left thigh, initial encounter: Secondary | ICD-10-CM | POA: Diagnosis not present

## 2023-10-25 NOTE — Progress Notes (Signed)
   Acute Office Visit  Subjective:     Patient ID: Alexis Hicks, female    DOB: 01/05/1961, 63 y.o.   MRN: [id number]  Chief Complaint  Patient presents with   Insect Bite    Insect bite 6-8 days ago that is located on upper left thigh and has been swelling. Has applied ice and benadryl and the swelling started to decrease. Patient states she seen an AD on tv about Lime disease and is wondering if this is wat is could be. Bite was itchy and different colors as well patient states.      HPI  Discussed the use of AI scribe software for clinical note transcription with the patient, who gave verbal consent to proceed.  History of Present Illness Alexis Hicks is a 63 year old female who presents with concerns about an insect bite and possible Lyme disease.  Approximately seven to eight days ago, she experienced an insect bite on her leg that was unusual compared to previous bites, presenting with significant swelling and itching. Her husband suggested it might be a spider bite. She managed the symptoms with Benadryl and ice, which reduced the swelling and itching over the week.  Despite the improvement, she became concerned about Lyme disease after seeing information on the internet and TV. She was worried about Lyme disease due to the unusual appearance of the bite and her recent onset of hip pain. No fever or chills were reported.  Her hips started hurting more than usual in the past two days. She has a history of arthritis and bursitis, which may contribute to her hip pain.  She has a known allergy to spider bites, having experienced a severe reaction with blood poisoning from a spider bite about twenty years ago. She applied Neosporin to the current bite as a precaution.  She has been gardening frequently, which has led to multiple insect bites. She is aware of the need to use insect repellent and wear protective clothing while gardening.     Objective:    BP 120/72    Pulse 60   Wt 150 lb (68 kg)   SpO2 97%   BMI 25.75 kg/m    Physical Exam  Gen: Well-appearing woman Skin: On her lateral left thigh there is a 1 cm erythematous raised patch, a very small central erosion.  No local skin breakdown or necrotic tissue.  No blanching erythema.  No lymphatic streaking, no purulence or tenderness to touch.      Assessment & Plan:    Problem List Items Addressed This Visit       Unprioritized   Arthropod bite - Primary   The bite is healing well with no signs of infection or cellulitis. There is a low risk of Lyme disease given the location and symptoms, suggesting a likely arthropod bite, possibly from a spider. Apply topical hydrocortisone for pruritus if needed. Use diphenhydramine or cetirizine for non-drowsy relief. Apply DEET-based insect repellent before gardening.       Return if symptoms worsen or fail to improve.  Cleatus Debby Specking, MD

## 2023-10-25 NOTE — Assessment & Plan Note (Signed)
 The bite is healing well with no signs of infection or cellulitis. There is a low risk of Lyme disease given the location and symptoms, suggesting a likely arthropod bite, possibly from a spider. Apply topical hydrocortisone for pruritus if needed. Use diphenhydramine or cetirizine for non-drowsy relief. Apply DEET-based insect repellent before gardening.

## 2023-10-25 NOTE — Patient Instructions (Signed)
  VISIT SUMMARY: Today, you were seen for concerns about an insect bite and the possibility of Lyme disease. You also discussed your hip pain, which has worsened recently, and your ongoing issues with allergic rhinitis.  YOUR PLAN: -INSECT BITE WITH INFLAMMATORY REACTION: The bite on your leg is healing well and shows no signs of infection. The risk of Lyme disease is low. Continue to use topical hydrocortisone for itching if needed, and you can take diphenhydramine or cetirizine for relief. Remember to apply DEET-based insect repellent before gardening to prevent future bites.  -ARTHRITIS AND BURSITIS: Your hip pain has worsened recently, but it is not related to the insect bite or Lyme disease. This pain is likely due to your existing arthritis and bursitis.  -ALLERGIC RHINITIS: You have ongoing issues with allergic rhinitis and have difficulty with fluticasone  propionate. You may consider purchasing fluticasone  furoate over the counter if it works better for you.  INSTRUCTIONS: Please follow up with Dr. Landy in August for blood tests and your six-month checkup.

## 2023-10-28 NOTE — Telephone Encounter (Signed)
 MRI scheduled for 11/05/23.

## 2023-11-03 ENCOUNTER — Other Ambulatory Visit (INDEPENDENT_AMBULATORY_CARE_PROVIDER_SITE_OTHER)

## 2023-11-03 ENCOUNTER — Telehealth: Payer: Self-pay

## 2023-11-03 DIAGNOSIS — M3500 Sicca syndrome, unspecified: Secondary | ICD-10-CM | POA: Diagnosis not present

## 2023-11-03 DIAGNOSIS — R772 Abnormality of alphafetoprotein: Secondary | ICD-10-CM

## 2023-11-03 DIAGNOSIS — R160 Hepatomegaly, not elsewhere classified: Secondary | ICD-10-CM

## 2023-11-03 DIAGNOSIS — R932 Abnormal findings on diagnostic imaging of liver and biliary tract: Secondary | ICD-10-CM

## 2023-11-03 DIAGNOSIS — R768 Other specified abnormal immunological findings in serum: Secondary | ICD-10-CM

## 2023-11-03 NOTE — Telephone Encounter (Signed)
 Patient called the office to have labs released, advised that labs were already released by another provider.

## 2023-11-04 ENCOUNTER — Ambulatory Visit: Admitting: Internal Medicine

## 2023-11-04 ENCOUNTER — Encounter: Payer: Self-pay | Admitting: Internal Medicine

## 2023-11-04 VITALS — BP 90/70 | HR 64 | Ht 64.0 in | Wt 147.4 lb

## 2023-11-04 DIAGNOSIS — R160 Hepatomegaly, not elsewhere classified: Secondary | ICD-10-CM

## 2023-11-04 DIAGNOSIS — R772 Abnormality of alphafetoprotein: Secondary | ICD-10-CM

## 2023-11-04 DIAGNOSIS — K769 Liver disease, unspecified: Secondary | ICD-10-CM | POA: Diagnosis not present

## 2023-11-04 DIAGNOSIS — K7460 Unspecified cirrhosis of liver: Secondary | ICD-10-CM

## 2023-11-04 NOTE — Patient Instructions (Addendum)
 Okay to drink 2 cups of coffee for liver health   Continue dietary changes and exercise regimen  Follow up in 6 months  If your blood pressure at your visit was 140/90 or greater, please contact your primary care physician to follow up on this.  _______________________________________________________  If you are age 65 or older, your body mass index should be between 23-30. Your Body mass index is 25.3 kg/m. If this is out of the aforementioned range listed, please consider follow up with your Primary Care Provider.  If you are age 63 or younger, your body mass index should be between 19-25. Your Body mass index is 25.3 kg/m. If this is out of the aformentioned range listed, please consider follow up with your Primary Care Provider.   ________________________________________________________  The [facility] providers would like to encourage you to use MYCHART to communicate with providers for non-urgent requests or questions.  Due to long hold times on the telephone, sending your provider a message by [facility] may be a faster and more efficient way to get a response.  Please allow 48 business hours for a response.  Please remember that this is for non-urgent requests.  _______________________________________________________  [facility] is using a team-based approach to care.  Your team is made up of your doctor and two to three APPS. Our APPS (Nurse Practitioners and Physician Assistants) work with your physician to ensure care continuity for you. They are fully qualified to address your health concerns and develop a treatment plan. They communicate directly with your gastroenterologist to care for you. Seeing the Advanced Practice Practitioners on your physician's team can help you by facilitating care more promptly, often allowing for earlier appointments, access to diagnostic testing, procedures, and other specialty referrals.     Thank you for entrusting me with your care and  for choosing [facility], Dr. Estefana Kidney

## 2023-11-04 NOTE — Progress Notes (Signed)
 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  Subsequent ultrasound elastography did not show an elevated kPa.  However she was noted to have a liver lesion that may be consistent with FNH.  An attempt was made to biopsy this lesion in 09/2023 but the lesion was located in the difficult location and after discussion between IR and the patient, they opted to hold off on liver biopsy at this time.  Thus we will continue surveillance of his liver lesion by performing surveillance MRIs as well as AFP measurements. It is not clear if the patient has underlying cirrhosis but we will continue with preventative measures and patient will continue to pursue healthy dietary habits to aid with weight loss. - Follow up results of recently submitted AFP  - MRI abdomen w/contrast planned for 11/2023 - Continue dietary changes and exercise regimen - Drink 2 cups of coffee per day - Avoid alcohol - RTC 6 months  History of colonoscopy 2022  benign polyps recall 10 years with Dr. Kristie Recall 2032  Lung nodules  being followed by primary care Non smoker  Possible autoimmune disease Sjogren's/Raynaud's/FM Follows with Dr. Monna   Patient Care Team: Levora Reyes SAUNDERS, MD as PCP - General (Family Medicine) Darcel Pool, MD (Obstetrics and Gynecology) Dolphus Reiter, MD (Rheumatology)  HISTORY OF PRESENT ILLNESS: 63 year old female with history of fibromyalgia, osteoporosis, Raynaud's syndrome presents for follow up of a liver lesion and possible cirrhosis  Interval History: Denies any ab pain. She changed her diet by adding apple cider vinegar, which has helped immensely with her ab pain. She used to be vegan, then  she started eating a small bit of meat. She eats fish a couple of times a week with plenty of vegetables. She does have constipation on occasion. She has been walking and doing her physical exercises. Weight has been fairly stable. Denies swelling in abdomen and legs. Denies confusion. Denies blood in the stools. Denies yellowing of the eyes.  Wt Readings from Last 3 Encounters:  11/04/23 147 lb 6 oz (66.8 kg)  10/25/23 150 lb (68 kg)  09/14/23 145 lb (65.8 kg)   She  reports that she has never smoked. She has been exposed to tobacco smoke. She has never used smokeless tobacco. She reports current alcohol use. She reports that she does not use drugs.  RELEVANT GI HISTORY, IMAGING AND LABS:  CT A/P w/o contrast 04/06/23: IMPRESSION: 1. No acute abnormality of the abdomen or pelvis. 2. No hernia or mass identified in the anterior abdominal wall to explain patient's symptoms. 3. Nodular hepatic contours suspicious for cirrhosis. 4. 12 mm nodular opacity in the right medial lung base. Consider one of the following in 3 months for both low-risk and high-risk individuals: (a) repeat chest CT, (b) follow-up PET-CT, or (c) tissue sampling. This recommendation follows the consensus statement: Guidelines for Management of Incidental Pulmonary Nodules Detected on CT Images: From the Fleischner Society 2017; Radiology 2017; 284:228-243.  Liver elastography 07/08/23: IMPRESSION: ULTRASOUND LIVER: Small cystic area along the posterior aspect of the liver. ULTRASOUND HEPATIC ELASTOGRAPHY: Median kPa:  6.5 Diagnostic category: < or = 9 kPa: in the absence of other known clinical signs, rules out cACLD In the setting of  elevated liver function tests, non-fasting state, or vascular congestion, the stage of liver fibrosis may be overestimated. In some patients with NAFLD, the cut-off values for cACLD may be lower (7-9 kPa). In causes other than viral hepatitis and NAFLD, the cut-off values are not  well established.  CT A/P w/contrast 07/12/23: IMPRESSION: Evidence of chronic liver disease. Nodular fatty liver infiltration. No splenomegaly or ascites. Patent portal vein. No significant varices. There is a hypervascular lesion identified peripherally in segment 7 which has washout and is slightly lower in density than adjacent liver on both delay and portal venous phase. There is also a dominant feeding artery to this lesion. Although there is be differential based on the appearance such as FNH, in the setting of chronic liver disease and elevated alpha fetoprotein, would recommend additional workup such as MRI with and without contrast to further characterize this lesion and assess for a potential hepatocellular carcinoma. In addition please correlate for any more remote prior examination to assess for change.  MRI liver 07/24/23 IMPRESSION: 1. In the posterior liver dome, hepatic segment VII, arterially hyperenhancing lesion measuring 2.7 x 2.2 cm. No evidence of washout or capsular enhancement by MR. 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 2. Mildly coarse contour of the liver, in keeping with stated diagnosis of cirrhosis. No ascites or splenomegaly. Aortic Atherosclerosis (ICD10-I70.0).  RUQ U/S 09/14/23: FINDINGS: *Echogenic liver with smooth contour. *2.6 cm arterially enhancing mass without washout, within a background of non-cirrhotic liver. *High, posterosuperior RIGHT hepatic lobe mass indistinguishable on regular ultrasound to background liver parenchyma. Lung presenting into view on respiration IMPRESSION: Contrast-enhanced ultrasound of the liver demonstrating a 2.6 cm early-enhancing posterosuperior liver mass, without evidence of washout.  CBC    Component Value  Date/Time   WBC 6.3 09/14/2023 1119   RBC 4.51 09/14/2023 1119   HGB 14.2 09/14/2023 1119   HCT 43.2 09/14/2023 1119   PLT 203 09/14/2023 1119   MCV 95.8 09/14/2023 1119   MCH 31.5 09/14/2023 1119   MCHC 32.9 09/14/2023 1119   RDW 11.9 09/14/2023 1119   LYMPHSABS 1.3 07/01/2023 1432   MONOABS 0.5 07/01/2023 1432   EOSABS 0.1 07/01/2023 1432   BASOSABS 0.1 07/01/2023 1432   Recent Labs    07/01/23 1432 09/14/23 1119  HGB 14.3 14.2    CMP     Component Value Date/Time   NA 141 07/01/2023 1432   K 4.0 07/01/2023 1432   CL 102 07/01/2023 1432   CO2 34 (H) 07/01/2023 1432   GLUCOSE 98 07/01/2023 1432   BUN 15 07/01/2023 1432   CREATININE 0.76 07/01/2023 1432   CREATININE 0.77 02/19/2022 1545   CALCIUM 9.4 07/01/2023 1432   PROT 6.6 07/01/2023 1432   ALBUMIN 4.2 07/01/2023 1432   AST 18 07/01/2023 1432   ALT 17 07/01/2023 1432   ALKPHOS 108 07/01/2023 1432   BILITOT 0.3 07/01/2023 1432      Latest Ref Rng & Units 07/01/2023    2:32 PM 05/04/2023    8:06 AM 10/13/2022    8:29 AM  Hepatic Function  Total Protein 6.0 - 8.3 g/dL 6.6  6.2  7.1   Albumin 3.5 - 5.2 g/dL 4.2  4.0  4.1   AST 0 - 37 U/L 18  19  19    ALT 0 - 35 U/L 17  17  15   Alk Phosphatase 39 - 117 U/L 108  109  105   Total Bilirubin 0.2 - 1.2 mg/dL 0.3  0.4  0.4   Bilirubin, Direct 0.0 - 0.3 mg/dL 0.0         Latest Ref Rng & Units 07/01/2023    2:32 PM  Hepatitis C  AFP ng/mL 6.7     Current Medications:     Current Outpatient Medications (Respiratory):    albuterol  (VENTOLIN  HFA) 108 (90 Base) MCG/ACT inhaler, Inhale 1-2 puffs into the lungs every 6 (six) hours as needed for wheezing or shortness of breath.   fluticasone  (FLONASE ) 50 MCG/ACT nasal spray, Place 2 sprays into both nostrils daily.   Current Outpatient Medications (Hematological):    Cyanocobalamin (VITAMIN B 12 PO), Take by mouth.  Current Outpatient Medications (Other):    ascorbic Acid (VITAMIN C) 500 MG CPCR, as needed. Pt  takes half of the dosage listed   B Complex Vitamins (VITAMIN-B COMPLEX PO), Take 1 capsule by mouth daily.   CALCIUM PO, Take 350 mg by mouth daily.   cholecalciferol (VITAMIN D ) 1000 units tablet, Take 2,000 Units by mouth daily.    cycloSPORINE (RESTASIS) 0.05 % ophthalmic emulsion, 1 drop daily.   triamcinolone cream (KENALOG) 0.1 %, triamcinolone acetonide 0.1 % topical cream  APPLY TO SKIN TWICE DAILY X 14 DAYS   Zinc 10 MG LOZG, Use as directed in the mouth or throat as needed.  Medical History:  Past Medical History:  Diagnosis Date   Chronically dry eyes, right    Dry mouth    DUB (dysfunctional uterine bleeding)    Fibromyalgia    Osteoporosis 04/2018   Dr. Norine   Pneumonia 04/2021   Raynaud's syndrome    Allergies: No Known Allergies   Surgical History:  She  has a past surgical history that includes Salpingectomy and Mole removal (12/02/2021). Family History:  Her family history includes Arthritis in her mother; Breast cancer in her paternal aunt and paternal grandmother; Dementia in her mother; Depression in her maternal grandmother; Diabetes in her brother; Gallstones in her daughter; Heart disease in her father; High Cholesterol in her brother; Hypertension in her brother and maternal grandmother; Kidney disease in her maternal grandfather; Metabolic syndrome in her brother; Osteoporosis in her mother; Skin cancer in her father; Stroke in her brother and maternal grandmother; Uterine cancer in her maternal aunt.  PHYSICAL EXAM: BP 90/70 (BP Location: Left Arm, Patient Position: Sitting, Cuff Size: Normal)   Pulse 64   Ht 5' 4 (1.626 m) Comment: height measured without shoes  Wt 147 lb 6 oz (66.8 kg)   BMI 25.30 kg/m  Physical Exam   GENERAL APPEARANCE: Well nourished, in no apparent distress. HEENT: No cervical lymphadenopathy, unremarkable thyroid, sclerae anicteric, conjunctiva pink. RESPIRATORY: Respiratory effort normal, BS equal bilateral without rales,  rhonchi, wheezing. CARDIO: RRR with no MRGs, peripheral pulses intact. ABDOMEN: Soft, non distended, active bowel sounds in all 4 quadrants, no tenderness to palpation, no rebound, no mass appreciated, spleen normal. MUSCULOSKELETAL: Full ROM, normal gait, without edema. SKIN: Dry, intact without rashes or lesions. No jaundice. NEURO: No asterixis PSYCH: Cooperative, normal mood and affect.      Rosario JAYSON Kidney, MD 8:42 AM  I spent 40 minutes of time, including in depth chart review, independent review of results as outlined above, communicating results with the patient directly, face-to-face time with the patient, coordinating care, ordering studies and medications as appropriate, and documentation.

## 2023-11-05 ENCOUNTER — Ambulatory Visit (HOSPITAL_COMMUNITY)

## 2023-11-05 LAB — AFP TUMOR MARKER: AFP-Tumor Marker: 6.5 ng/mL — ABNORMAL HIGH

## 2023-11-08 ENCOUNTER — Ambulatory Visit: Payer: Self-pay | Admitting: Physician Assistant

## 2023-11-15 ENCOUNTER — Ambulatory Visit: Payer: Self-pay | Admitting: Family Medicine

## 2023-11-15 ENCOUNTER — Ambulatory Visit (INDEPENDENT_AMBULATORY_CARE_PROVIDER_SITE_OTHER): Payer: 59 | Admitting: Family Medicine

## 2023-11-15 ENCOUNTER — Telehealth: Payer: Self-pay | Admitting: *Deleted

## 2023-11-15 VITALS — BP 110/72 | HR 65 | Temp 98.0°F | Ht 64.0 in | Wt 149.2 lb

## 2023-11-15 DIAGNOSIS — M19071 Primary osteoarthritis, right ankle and foot: Secondary | ICD-10-CM

## 2023-11-15 DIAGNOSIS — M19042 Primary osteoarthritis, left hand: Secondary | ICD-10-CM

## 2023-11-15 DIAGNOSIS — M51369 Other intervertebral disc degeneration, lumbar region without mention of lumbar back pain or lower extremity pain: Secondary | ICD-10-CM

## 2023-11-15 DIAGNOSIS — M797 Fibromyalgia: Secondary | ICD-10-CM

## 2023-11-15 DIAGNOSIS — M7061 Trochanteric bursitis, right hip: Secondary | ICD-10-CM

## 2023-11-15 DIAGNOSIS — M19041 Primary osteoarthritis, right hand: Secondary | ICD-10-CM

## 2023-11-15 DIAGNOSIS — M5030 Other cervical disc degeneration, unspecified cervical region: Secondary | ICD-10-CM

## 2023-11-15 DIAGNOSIS — M3500 Sicca syndrome, unspecified: Secondary | ICD-10-CM

## 2023-11-15 DIAGNOSIS — R7303 Prediabetes: Secondary | ICD-10-CM | POA: Diagnosis not present

## 2023-11-15 DIAGNOSIS — R5383 Other fatigue: Secondary | ICD-10-CM

## 2023-11-15 DIAGNOSIS — H04129 Dry eye syndrome of unspecified lacrimal gland: Secondary | ICD-10-CM | POA: Diagnosis not present

## 2023-11-15 DIAGNOSIS — Z0000 Encounter for general adult medical examination without abnormal findings: Secondary | ICD-10-CM | POA: Diagnosis not present

## 2023-11-15 DIAGNOSIS — R768 Other specified abnormal immunological findings in serum: Secondary | ICD-10-CM

## 2023-11-15 DIAGNOSIS — E785 Hyperlipidemia, unspecified: Secondary | ICD-10-CM

## 2023-11-15 DIAGNOSIS — M47816 Spondylosis without myelopathy or radiculopathy, lumbar region: Secondary | ICD-10-CM

## 2023-11-15 LAB — POCT GLYCOSYLATED HEMOGLOBIN (HGB A1C): HbA1c, POC (controlled diabetic range): 5.4 % (ref 0.0–7.0)

## 2023-11-15 NOTE — Addendum Note (Signed)
 Addended by: CENA ALFONSO CROME on: 11/15/2023 04:25 PM   Modules accepted: Orders

## 2023-11-15 NOTE — Progress Notes (Signed)
 Office Visit Note  Patient: Alexis Hicks             Date of Birth: November 26, 1960           MRN: [id number]             PCP: Levora Reyes SAUNDERS, MD Referring: Levora Reyes SAUNDERS, MD Visit Date: 11/29/2023 Occupation: @GUAROCC @  Subjective:  Disucss lab results   History of Present Illness: Alexis Hicks is a 63 y.o. female with history of sicca symptoms and osteoarthritis.  Patient presents today to discuss lab results from 11/16/2023.  She continues to have chronic sicca symptoms which have been manageable overall.  Patient continues to remain under the care of ophthalmology and the dentist on a regular basis.  She has been using Restasis eyedrops twice daily for dry eyes and drinking water throughout the day to help alleviate symptoms of dry mouth.  Patient has also started using a eye mask at night which she has found to be helpful. She denies any oral or nasal ulcerations, rashes, Raynaud's phenomenon, or signs of alopecia. She continues to experience discomfort and stiffness in the cervical spine.  She has requested a new referral to physical therapy at [facility].  Patient states she would also like physical therapy for lower extremity strengthening.  She has started to go to [facility] for exercise starting on 10/05/2023 and has been doing some strength training.  She has also been gardening this summer and has noticed an improvement in her flexibility and strength.    Activities of Daily Living:  Patient reports morning stiffness for less than 5 minutes.   Patient Reports nocturnal pain.  Difficulty dressing/grooming: Denies Difficulty climbing stairs: Denies Difficulty getting out of chair: Denies Difficulty using hands for taps, buttons, cutlery, and/or writing: Reports  Review of Systems  Constitutional:  Negative for fatigue.  HENT:  Positive for mouth dryness. Negative for mouth sores.   Eyes:  Positive for dryness.  Respiratory:  Negative for shortness of  breath.   Cardiovascular:  Negative for chest pain and palpitations.  Gastrointestinal:  Negative for blood in stool, constipation and diarrhea.  Endocrine: Negative for increased urination.  Genitourinary:  Negative for involuntary urination.  Musculoskeletal:  Positive for joint pain, joint pain, morning stiffness and muscle tenderness. Negative for gait problem, joint swelling, myalgias, muscle weakness and myalgias.  Skin:  Positive for sensitivity to sunlight. Negative for color change, rash and hair loss.  Allergic/Immunologic: Negative for susceptible to infections.  Neurological:  Negative for dizziness and headaches.  Hematological:  Negative for swollen glands.  Psychiatric/Behavioral:  Positive for sleep disturbance. Negative for depressed mood. The patient is not nervous/anxious.     PMFS History:  Patient Active Problem List   Diagnosis Date Noted   Arthropod bite 10/25/2023   Disorder of bone and articular cartilage 02/25/2023   Osteoarthritis 02/25/2023   Skin lesion 12/02/2021   Benign mole 09/23/2021   Right foot drop 01/09/2021   Other osteoporosis without current pathological fracture 01/09/2021   Osteopenia of multiple sites 08/25/2016   Vitamin D  deficiency 08/25/2016   Fatigue 02/17/2016   Primary osteoarthritis of both feet 02/17/2016   Weakness of right lower extremity 02/17/2016   Migraine without status migrainosus, not intractable 02/17/2016   Raynaud's  02/17/2016   Sicca syndrome (HCC) 02/17/2016   DUB (dysfunctional uterine bleeding)    Fibromyalgia syndrome 09/10/2011   Primary osteoarthritis of both hands 09/10/2011   Scoliosis 09/10/2011  Past Medical History:  Diagnosis Date   Chronically dry eyes, right    Dry mouth    DUB (dysfunctional uterine bleeding)    Fibromyalgia    Osteoporosis 04/2018   Dr. Norine   Pneumonia 04/2021   Raynaud's syndrome     Family History  Problem Relation Age of Onset   Arthritis Mother     Osteoporosis Mother    Dementia Mother    Skin cancer Father    Heart disease Father    Metabolic syndrome Brother    Diabetes Brother    Hypertension Brother    High Cholesterol Brother    Stroke Brother    Uterine cancer Maternal Aunt    Breast cancer Paternal Aunt    Depression Maternal Grandmother    Hypertension Maternal Grandmother    Stroke Maternal Grandmother    Kidney disease Maternal Grandfather    Breast cancer Paternal Grandmother    Gallstones Daughter    Past Surgical History:  Procedure Laterality Date   MOLE REMOVAL  12/02/2021   face   SALPINGECTOMY     Social History   Social History Narrative   Not on file   Immunization History  Administered Date(s) Administered   Influenza Inj Mdck Quad Pf 12/27/2021   Influenza, Mdck, Trivalent,PF 6+ MOS(egg free) 12/27/2022   Influenza-Unspecified 01/05/2015, 01/11/2019, 01/16/2021   PFIZER(Purple Top)SARS-COV-2 Vaccination 06/16/2019, 07/10/2019, 03/09/2020, 08/14/2020, 01/30/2021   PNEUMOCOCCAL CONJUGATE-20 06/18/2023   Pfizer(Comirnaty )Fall Seasonal Vaccine 12 years and older 02/04/2023, 10/11/2023   Respiratory Syncytial Virus Vaccine,Recomb Aduvanted(Arexvy) 01/19/2023   Tdap 10/30/2021   Zoster Recombinant(Shingrix) 10/24/2020, 01/02/2021     Objective: Vital Signs: BP 99/64 (BP Location: Left Arm, Patient Position: Sitting, Cuff Size: Normal)   Pulse (!) 59   Resp 14   Ht 5' 4 (1.626 m)   Wt 148 lb (67.1 kg)   BMI 25.40 kg/m    Physical Exam Vitals and nursing note reviewed.  Constitutional:      Appearance: She is well-developed.  HENT:     Head: Normocephalic and atraumatic.  Eyes:     Conjunctiva/sclera: Conjunctivae normal.  Cardiovascular:     Rate and Rhythm: Normal rate and regular rhythm.     Heart sounds: Normal heart sounds.  Pulmonary:     Effort: Pulmonary effort is normal.     Breath sounds: Normal breath sounds.  Abdominal:     General: Bowel sounds are normal.      Palpations: Abdomen is soft.  Musculoskeletal:     Cervical back: Normal range of motion.  Lymphadenopathy:     Cervical: No cervical adenopathy.  Skin:    General: Skin is warm and dry.     Capillary Refill: Capillary refill takes less than 2 seconds.  Neurological:     Mental Status: She is alert and oriented to person, place, and time.  Psychiatric:        Behavior: Behavior normal.      Musculoskeletal Exam: C-spine has limited range of motion lateral rotation.  Thoracic kyphosis noted.  Shoulder joints, elbow joints, wrist joints, MCPs, PIPs, DIPs have good range of motion with no synovitis.  Complete fist formation bilaterally.  Hip joints have good range of motion with no groin pain.  Mild tenderness of bilateral trochanteric bursa.  Knee joints have good range of motion with no warmth or effusion.  Right ankle in a brace. Left ankle has good ROM with no tenderness or joint swelling.   CDAI Exam: CDAI Score: -- Patient  Global: --; Provider Global: -- Swollen: --; Tender: -- Joint Exam 11/29/2023   No joint exam has been documented for this visit   There is currently no information documented on the homunculus. Go to the Rheumatology activity and complete the homunculus joint exam.  Investigation: No additional findings.  Imaging: MR LIVER W WO CONTRAST Result Date: 11/25/2023 CLINICAL DATA:  liver mass question FNH, rule out HCC. EXAM: MRI ABDOMEN WITHOUT AND WITH CONTRAST TECHNIQUE: Multiplanar multisequence MR imaging of the abdomen was performed both before and after the administration of intravenous contrast. CONTRAST:  7 mL of Eovist . COMPARISON:  MRI abdomen from 07/24/2023. FINDINGS: Lower chest: Unremarkable MR appearance to the lung bases. No pleural effusion. No pericardial effusion. Normal heart size. Hepatobiliary: The liver is top-normal in size. There are scattered several simple cysts with largest measuring up to 8 x 9 mm in the right hepatic lobe, segment seven.  No frank cirrhotic liver configuration noted. Redemonstration of an approximately 1.8 x 3.1 cm subcapsular, T1 and T2 faintly hyperintense lesion in the segment 7. The lesion exhibits arterial hyperenhancement. The lesion retains contrast on the delayed (20 minute), images. 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 No intrahepatic or extrahepatic bile duct dilatation. No choledocholithiasis. Unremarkable gallbladder. Pancreas: No mass, inflammatory changes or other parenchymal abnormality identified. No main pancreatic duct dilation. Spleen:  Within normal limits in size and appearance. No focal mass. Adrenals/Urinary Tract: Unremarkable adrenal glands. No hydroureteronephrosis. No suspicious renal mass. Stomach/Bowel: Visualized portions within the abdomen are unremarkable. No disproportionate dilation of bowel loops. Vascular/Lymphatic: No pathologically enlarged lymph nodes identified. No abdominal aortic aneurysm demonstrated. No ascites. Other:  None. Musculoskeletal: No suspicious bone lesions identified. IMPRESSION: 1. 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 2. Multiple other nonacute observations, as described above. Electronically Signed   By: Ree Molt M.D.   On: 11/25/2023 16:33    Recent Labs: Lab Results  Component Value Date   WBC 6.7 11/16/2023   HGB 14.4 11/16/2023   PLT 205 11/16/2023   NA 140 11/16/2023   K 4.5 11/16/2023   CL 103 11/16/2023   CO2 31 11/16/2023   GLUCOSE 93 11/16/2023   BUN 17 11/16/2023   CREATININE 0.83 11/16/2023   BILITOT 0.3 11/16/2023   ALKPHOS 108 07/01/2023   AST 23 11/16/2023   ALT 20 11/16/2023   PROT 6.9 11/16/2023   ALBUMIN 4.2 07/01/2023   CALCIUM 9.7 11/16/2023    Speciality Comments: Boniva 1 dose 03/21-SEs actonel 04/21  Procedures:  No procedures performed Allergies: Patient has no known  allergies.     Assessment / Plan:     Visit Diagnoses: Sicca syndrome (HCC) - 2024: ANA 1: 40, SSA negative, SSB negative, RF negative: Lab work from 11/16/23 was reviewed today in the office: ANA remains positive 1:80NS, Ro antibody negative, dsDNA negative, complements WNL, ESR WNL, no proteinuria.  All questions were addressed. Discussed the use of Restasis twice daily and over-the-counter products for symptomatic relief.  Discussed the importance of continuing to see ophthalmology on a regular basis and the dentist at least every 6 months. Discussed that she can continue to use an eye mask and warm compresses as needed. She is not exhibiting any other signs or symptoms of autoimmune disease and does not currently meet clinical criteria for systemic lupus or Sjogren syndrome.  Discussed signs and symptoms to monitor for.  She will notify us  if she develops any new or worsening symptoms.  She will follow-up in the office in 6 months or sooner if needed.  Primary osteoarthritis of both hands: Mild PIP and DIP thickening noted.  She has intermittent discomfort in both CMC joints.  She is able to make a complete fist bilaterally.  No synovitis noted.    Trochanteric bursitis of both hips: Improved with physical therapy.  She has mild tenderness upon palpation today.  She has good range of motion of both hips with no groin pain currently. She requested a new referral to physical therapy for lower extremity strengthening.   Primary osteoarthritis of both feet: She wears a brace on her right foot.  Right foot drop: Using a brace for support.   DDD (degenerative disc disease), cervical: She continues to have chronic pain and stiffness of the cervical spine.  She has requested a new referral to [facility] which will be placed today.  Other form of scoliosis of thoracolumbar spine: Chronic pain   Spondylosis without myelopathy or radiculopathy, lumbar region: Chronic pain.  She requested a  referral to physical therapy today for lower extremity strengthening.  Fibromyalgia syndrome: Patient continues to experience intermittent myalgias and muscle tenderness due to fibromyalgia.  She has increased her exercise regimen by starting weight lifting at [employer] an gardening this summer.  Discussed the importance of regular exercise and good sleep hygiene.   Other osteoporosis without current pathological fracture: Previously on Actonel 150 mg tablets every month-started in April 2021.DEXA 04/16/2023: BMD measured at [facility] Right is 0.693 g/cm2 with a T-score of -2.5.  She is on calcium and vitamin D .  Dr. Dolphus discussed DEXA results and treatment options at the last office visit on 05/24/2023.  She is currently taking calcium vitamin D  supplements.  She has been going to [employer] and has been weightlifting for exercise.  Vitamin D  deficiency: She is taking vitamin D  2000 units daily.   Other medical conditions are listed as follows:   Other fatigue  History of migraine  History of abdominal pain  Orders: No orders of the defined types were placed in this encounter.  No orders of the defined types were placed in this encounter.   Follow-Up Instructions: Return in about 6 months (around 05/31/2024).   Waddell CHRISTELLA Craze, PA-C  Note - This record has been created using Dragon software.  Chart creation errors have been sought, but may not always  have been located. Such creation errors do not reflect on  the standard of medical care.

## 2023-11-15 NOTE — Progress Notes (Signed)
 Subjective:  Patient ID: Alexis Hicks, female    DOB: 1960-06-13  Age: 63 y.o. MRN: [id number]  CC:  Chief Complaint  Patient presents with   Annual Exam    HPI Alexis Hicks presents for Annual Exam  PCP, me [facility], Dr. Federico, Alan Coombs, PA.  Possible cirrhosis with negative hepatocellular workup, likely NASH.  Elevated AFP and liver lesion seen on MRI abdomen with without contrast in April, potentially FNH.  Difficult location for biopsy.  Decided to hold off on biopsy in June.  Continued surveillance planned with MRIs and AFP measurements - AFP stable at 6.5, 12 days ago.  MRI liver scheduled August 21. Dermatology, Dr. Tricia Rheumatology, Dr. Dolphus, sicca syndrome, osteoarthritis, history of foot drop, DDD cervical, fibromyalgia, osteoporosis, vitamin D  deficiency. Ongoing labs and plan for physcial therapy - from Dr. Verdel.  Pulmonary, Dr. Shelah, Lauraine Lites with prior lung nodule.  Oral antihistamines have been avoided due to side effects. daily Flonase ,  Flovent  for asthma as needed previously - no recent need. Albuterol  if needed - using for walks only. No increased need.  Ophthalmology, Dr. Elma prior - plans to switch to new optho - would like new referral,  dry eye syndrome.  Treated with Restasis. [facility] - [facility]. Plans to transition back to Dr. Darcel at her practice. Last gyn visit in May. Mammogram at that time. No concerns.   She was seen by my colleague July 21 after likely arthropod bite, possibly spider..  No sign of infection or cellulitis, low risk of Lyme disease, topical hydrocortisone for pruritus if needed at that time.  No residual symptoms.   Hyperlipidemia: With low 10-year ASCVD risk score previously.  Diet/exercise approach. The 10-year ASCVD risk score (Arnett DK, et al., 2019) is: 4.1%   Values used to calculate the score:     Age: 76 years     Clincally relevant sex: Female     Is  Non-Hispanic African American: No     Diabetic: No     Tobacco smoker: No     Systolic Blood Pressure: 110 mmHg     Is BP treated: No     HDL Cholesterol: 55.2 mg/dL     Total Cholesterol: 191 mg/dL  Lab Results  Component Value Date   CHOL 191 05/04/2023   HDL 55.20 05/04/2023   LDLCALC 116 (H) 05/04/2023   TRIG 95.0 05/04/2023   CHOLHDL 3 05/04/2023   Lab Results  Component Value Date   ALT 17 07/01/2023   AST 18 07/01/2023   ALKPHOS 108 07/01/2023   BILITOT 0.3 07/01/2023   Prediabetes: Diet/exercise approach.  Weight is overall stable. No meds.multiple labs and some bruising from blood draws - prefers to defer labs if possible - fingerstick A1c today.  Lab Results  Component Value Date   HGBA1C 6.0 05/04/2023   Wt Readings from Last 3 Encounters:  11/15/23 149 lb 3.2 oz (67.7 kg)  11/04/23 147 lb 6 oz (66.8 kg)  10/25/23 150 lb (68 kg)       02/26/2023    9:40 AM 11/05/2022    1:01 PM 09/28/2022    1:24 PM 05/22/2022    9:21 AM 09/18/2021    9:13 AM  Depression screen PHQ 2/9  Decreased Interest 0 0 0 0 0  Down, Depressed, Hopeless 0 0 0 0 0  PHQ - 2 Score 0 0 0 0 0  Altered sleeping 0 0 0 0   Tired, decreased energy  0 0 0 0   Change in appetite 0 0 0 0   Feeling bad or failure about yourself  0 0 0 0   Trouble concentrating 0 0 0 0   Moving slowly or fidgety/restless 0 0 0 0   Suicidal thoughts 0 0 0 0   PHQ-9 Score 0 0 0 0   Difficult doing work/chores Not difficult at all   Not difficult at all     Health Maintenance  Topic Date Due   Medicare Annual Wellness (AWV)  Never done   HIV Screening  Never done   Hepatitis B Vaccines (1 of 3 - Risk 3-dose series) Never done   INFLUENZA VACCINE  11/05/2023   COVID-19 Vaccine (8 - 2024-25 season) 12/06/2023   MAMMOGRAM  09/08/2025   Cervical Cancer Screening (HPV/Pap Cotest)  09/22/2027   Colonoscopy  05/20/2030   DTaP/Tdap/Td (2 - Td or Tdap) 10/31/2031   Pneumococcal Vaccine: 50+ Years  Completed   DEXA  SCAN  Completed   Hepatitis C Screening  Completed   Zoster Vaccines- Shingrix  Completed   HPV VACCINES  Aged Out   Meningococcal B Vaccine  Aged Out  Colonoscopy 2022, benign polyps, recall in 10 years.  Mammogram, pap at GYN D[facility] yearly screening.    Immunization History  Administered Date(s) Administered   Influenza Inj Mdck Quad Pf 12/27/2021   Influenza, Mdck, Trivalent,PF 6+ MOS(egg free) 12/27/2022   Influenza-Unspecified 01/05/2015, 01/11/2019, 01/16/2021   PFIZER(Purple Top)SARS-COV-2 Vaccination 06/16/2019, 07/10/2019, 03/09/2020, 08/14/2020, 01/30/2021   PNEUMOCOCCAL CONJUGATE-20 06/18/2023   Pfizer(Comirnaty )Fall Seasonal Vaccine 12 years and older 02/04/2023, 10/11/2023   Respiratory Syncytial Virus Vaccine,Recomb Aduvanted(Arexvy) 01/19/2023   Tdap 10/30/2021   Zoster Recombinant(Shingrix) 10/24/2020, 01/02/2021  Flu vaccine in fall, recent covid booster in July.   No results found. Optho as above.   Dental: every 4 months.   Alcohol:none  Tobacco: none  Exercise: at [employer] since July. Weights 2 times per week, walking 5-7 d/week, daily stretching.     History Patient Active Problem List   Diagnosis Date Noted   Arthropod bite 10/25/2023   Disorder of bone and articular cartilage 02/25/2023   Osteoarthritis 02/25/2023   Skin lesion 12/02/2021   Benign mole 09/23/2021   Right foot drop 01/09/2021   Other osteoporosis without current pathological fracture 01/09/2021   Osteopenia of multiple sites 08/25/2016   Vitamin D  deficiency 08/25/2016   Fatigue 02/17/2016   Primary osteoarthritis of both feet 02/17/2016   Weakness of right lower extremity 02/17/2016   Migraine without status migrainosus, not intractable 02/17/2016   Raynaud's  02/17/2016   Sicca syndrome (HCC) 02/17/2016   DUB (dysfunctional uterine bleeding)    Fibromyalgia syndrome 09/10/2011   Primary osteoarthritis of both hands 09/10/2011   Scoliosis 09/10/2011   Past Medical  History:  Diagnosis Date   Chronically dry eyes, right    Dry mouth    DUB (dysfunctional uterine bleeding)    Fibromyalgia    Osteoporosis 04/2018   Dr. Norine   Pneumonia 04/2021   Raynaud's syndrome    Past Surgical History:  Procedure Laterality Date   MOLE REMOVAL  12/02/2021   face   SALPINGECTOMY     No Known Allergies Prior to Admission medications   Medication Sig Start Date End Date Taking? Authorizing Provider  albuterol  (VENTOLIN  HFA) 108 (90 Base) MCG/ACT inhaler Inhale 1-2 puffs into the lungs every 6 (six) hours as needed for wheezing or shortness of breath. 06/18/23  Yes Ruthell Domino  F, NP  ascorbic Acid (VITAMIN C) 500 MG CPCR as needed. Pt takes half of the dosage listed   Yes [provider]  B Complex Vitamins (VITAMIN-B COMPLEX PO) Take 1 capsule by mouth daily.   Yes [provider]  CALCIUM PO Take 350 mg by mouth daily.   Yes [provider]  cholecalciferol (VITAMIN D ) 1000 units tablet Take 2,000 Units by mouth daily.    Yes [provider]  Cyanocobalamin (VITAMIN B 12 PO) Take by mouth.   Yes [provider]  cycloSPORINE (RESTASIS) 0.05 % ophthalmic emulsion 1 drop daily.   Yes [provider]  fluticasone  (FLONASE ) 50 MCG/ACT nasal spray Place 2 sprays into both nostrils daily. 05/12/23  Yes Levora Reyes SAUNDERS, MD  triamcinolone cream (KENALOG) 0.1 % triamcinolone acetonide 0.1 % topical cream  APPLY TO SKIN TWICE DAILY X 14 DAYS   Yes [provider]  Zinc 10 MG LOZG Use as directed in the mouth or throat as needed.   Yes [provider]   Social History   Socioeconomic History   Marital status: Married    Spouse name: Not on file   Number of children: Not on file   Years of education: Not on file   Highest education level: Not on file  Occupational History   Not on file  Tobacco Use   Smoking status: Never    Passive exposure: Past   Smokeless tobacco: Never  Vaping Use    Vaping status: Never Used  Substance and Sexual Activity   Alcohol use: Yes    Comment: rarely   Drug use: No   Sexual activity: Not Currently  Other Topics Concern   Not on file  Social History Narrative   Not on file   Social Drivers of Health   Financial Resource Strain: Low Risk  (11/15/2023)   Overall Financial Resource Strain (CARDIA)    Difficulty of Paying Living Expenses: Not hard at all  Food Insecurity: No Food Insecurity (11/15/2023)   Hunger Vital Sign    Worried About Running Out of Food in the Last Year: Never true    Ran Out of Food in the Last Year: Never true  Transportation Needs: No Transportation Needs (11/15/2023)   PRAPARE - Administrator, Civil Service (Medical): No    Lack of Transportation (Non-Medical): No  Physical Activity: Not on file  Stress: Not on file  Social Connections: Not on file  Intimate Partner Violence: Not At Risk (11/15/2023)   Humiliation, Afraid, Rape, and Kick questionnaire    Fear of Current or Ex-Partner: No    Emotionally Abused: No    Physically Abused: No    Sexually Abused: No    Review of Systems 13 point review of systems per patient health survey noted.  Negative other than as indicated above or in HPI.    Objective:   Vitals:   11/15/23 0942  BP: 110/72  Pulse: 65  Temp: 98 F (36.7 C)  TempSrc: Oral  SpO2: 95%  Weight: 149 lb 3.2 oz (67.7 kg)  Height: 5' 4 (1.626 m)     Physical Exam Constitutional:      Appearance: She is well-developed.  HENT:     Head: Normocephalic and atraumatic.     Right Ear: External ear normal.     Left Ear: External ear normal.  Eyes:     Conjunctiva/sclera: Conjunctivae normal.     Pupils: Pupils are equal, round, and reactive  to light.  Neck:     Thyroid: No thyromegaly.  Cardiovascular:     Rate and Rhythm: Normal rate and regular rhythm.     Heart sounds: Normal heart sounds. No murmur heard. Pulmonary:     Effort: Pulmonary effort is normal. No  respiratory distress.     Breath sounds: Normal breath sounds. No wheezing.  Abdominal:     General: Bowel sounds are normal.     Palpations: Abdomen is soft.     Tenderness: There is no abdominal tenderness.  Musculoskeletal:        General: No tenderness. Normal range of motion.     Cervical back: Normal range of motion and neck supple.  Lymphadenopathy:     Cervical: No cervical adenopathy.  Skin:    General: Skin is warm and dry.     Findings: No rash.  Neurological:     Mental Status: She is alert and oriented to person, place, and time.  Psychiatric:        Behavior: Behavior normal.        Thought Content: Thought content normal.    Results for orders placed or performed in visit on 11/15/23  POCT glycosylated hemoglobin (Hb A1C)   Collection Time: 11/15/23 11:13 AM  Result Value Ref Range   Hemoglobin A1C     HbA1c POC (<> result, manual entry)     HbA1c, POC (prediabetic range)     HbA1c, POC (controlled diabetic range) 5.4 0.0 - 7.0 %       Assessment & Plan:  Alexis Hicks is a 63 y.o. female . Annual physical exam  - -anticipatory guidance as below in AVS, screening labs above -some labs have been checked by her rheumatologist.  Concerns with some bruising with venipunctures.  Ultimately decided on A1c by fingerstick as above, lipid panel either with her next blood draw at rheumatology or can be checked at her follow-up visit in 6 months.  Low ASCVD risk score previously.  Health maintenance items as above in HPI discussed/recommended as applicable.   Dry eye - Plan: Ambulatory referral to Ophthalmology  - Refer to Optho for continued care.  History of chronic dry eye, sicca syndrome as above.  Prediabetes - Plan: POCT glycosylated hemoglobin (Hb A1C)  - Normal range on current A1c, continue diet/exercise approach.  Hyperlipidemia, unspecified hyperlipidemia type  - As above, low ASCVD risk score previously.  Continue diet/exercise approach, repeat  labs rheumatology or with me in 6 months.  No orders of the defined types were placed in this encounter.  Patient Instructions  At some point I would recommend repeat lipid panel. That can be added to other office labs or I can check at your next visit.   Other labs as planned with your rheumatologist.   Take care!  Health Maintenance After Age 63 After age 59, you are at a higher risk for certain long-term diseases and infections as well as injuries from falls. Falls are a major cause of broken bones and head injuries in people who are older than age 53. Getting regular preventive care can help to keep you healthy and well. Preventive care includes getting regular testing and making lifestyle changes as recommended by your health care provider. Talk with your health care provider about: Which screenings and tests you should have. A screening is a test that checks for a disease when you have no symptoms. A diet and exercise plan that is right for you. What should I know about  screenings and tests to prevent falls? Screening and testing are the best ways to find a health problem early. Early diagnosis and treatment give you the best chance of managing medical conditions that are common after age 54. Certain conditions and lifestyle choices may make you more likely to have a fall. Your health care provider may recommend: Regular vision checks. Poor vision and conditions such as cataracts can make you more likely to have a fall. If you wear glasses, make sure to get your prescription updated if your vision changes. Medicine review. Work with your health care provider to regularly review all of the medicines you are taking, including over-the-counter medicines. Ask your health care provider about any side effects that may make you more likely to have a fall. Tell your health care provider if any medicines that you take make you feel dizzy or sleepy. Strength and balance checks. Your health care  provider may recommend certain tests to check your strength and balance while standing, walking, or changing positions. Foot health exam. Foot pain and numbness, as well as not wearing proper footwear, can make you more likely to have a fall. Screenings, including: Osteoporosis screening. Osteoporosis is a condition that causes the bones to get weaker and break more easily. Blood pressure screening. Blood pressure changes and medicines to control blood pressure can make you feel dizzy. Depression screening. You may be more likely to have a fall if you have a fear of falling, feel depressed, or feel unable to do activities that you used to do. Alcohol use screening. Using too much alcohol can affect your balance and may make you more likely to have a fall. Follow these instructions at home: Lifestyle Do not drink alcohol if: Your health care provider tells you not to drink. If you drink alcohol: Limit how much you have to: 0-1 drink a day for women. 0-2 drinks a day for men. Know how much alcohol is in your drink. In the [place] one drink equals one 12 oz bottle of beer (355 mL), one 5 oz glass of wine (148 mL), or one 1 oz glass of hard liquor (44 mL). Do not use any products that contain nicotine or tobacco. These products include cigarettes, chewing tobacco, and vaping devices, such as e-cigarettes. If you need help quitting, ask your health care provider. Activity  Follow a regular exercise program to stay fit. This will help you maintain your balance. Ask your health care provider what types of exercise are appropriate for you. If you need a cane or walker, use it as recommended by your health care provider. Wear supportive shoes that have nonskid soles. Safety  Remove any tripping hazards, such as rugs, cords, and clutter. Install safety equipment such as grab bars in bathrooms and safety rails on stairs. Keep rooms and walkways well-lit. General instructions Talk with your health  care provider about your risks for falling. Tell your health care provider if: You fall. Be sure to tell your health care provider about all falls, even ones that seem minor. You feel dizzy, tiredness (fatigue), or off-balance. Take over-the-counter and prescription medicines only as told by your health care provider. These include supplements. Eat a healthy diet and maintain a healthy weight. A healthy diet includes low-fat dairy products, low-fat (lean) meats, and fiber from whole grains, beans, and lots of fruits and vegetables. Stay current with your vaccines. Schedule regular health, dental, and eye exams. Summary Having a healthy lifestyle and getting preventive care can help to protect  your health and wellness after age 68. Screening and testing are the best way to find a health problem early and help you avoid having a fall. Early diagnosis and treatment give you the best chance for managing medical conditions that are more common for people who are older than age 52. Falls are a major cause of broken bones and head injuries in people who are older than age 50. Take precautions to prevent a fall at home. Work with your health care provider to learn what changes you can make to improve your health and wellness and to prevent falls. This information is not intended to replace advice given to you by your health care provider. Make sure you discuss any questions you have with your health care provider. Document Revised: 08/12/2020 Document Reviewed: 08/12/2020 Elsevier Patient Education  2024 Elsevier Inc.      Signed,   Reyes Pines, MD [facility], [facility] 11/15/23 10:35 AM

## 2023-11-15 NOTE — Patient Instructions (Addendum)
 At some point I would recommend repeat lipid panel. That can be added to other office labs or I can check at your next visit.   Other labs as planned with your rheumatologist.   Take care!  Health Maintenance After Age 65 After age 20, you are at a higher risk for certain long-term diseases and infections as well as injuries from falls. Falls are a major cause of broken bones and head injuries in people who are older than age 24. Getting regular preventive care can help to keep you healthy and well. Preventive care includes getting regular testing and making lifestyle changes as recommended by your health care provider. Talk with your health care provider about: Which screenings and tests you should have. A screening is a test that checks for a disease when you have no symptoms. A diet and exercise plan that is right for you. What should I know about screenings and tests to prevent falls? Screening and testing are the best ways to find a health problem early. Early diagnosis and treatment give you the best chance of managing medical conditions that are common after age 18. Certain conditions and lifestyle choices may make you more likely to have a fall. Your health care provider may recommend: Regular vision checks. Poor vision and conditions such as cataracts can make you more likely to have a fall. If you wear glasses, make sure to get your prescription updated if your vision changes. Medicine review. Work with your health care provider to regularly review all of the medicines you are taking, including over-the-counter medicines. Ask your health care provider about any side effects that may make you more likely to have a fall. Tell your health care provider if any medicines that you take make you feel dizzy or sleepy. Strength and balance checks. Your health care provider may recommend certain tests to check your strength and balance while standing, walking, or changing positions. Foot health exam.  Foot pain and numbness, as well as not wearing proper footwear, can make you more likely to have a fall. Screenings, including: Osteoporosis screening. Osteoporosis is a condition that causes the bones to get weaker and break more easily. Blood pressure screening. Blood pressure changes and medicines to control blood pressure can make you feel dizzy. Depression screening. You may be more likely to have a fall if you have a fear of falling, feel depressed, or feel unable to do activities that you used to do. Alcohol use screening. Using too much alcohol can affect your balance and may make you more likely to have a fall. Follow these instructions at home: Lifestyle Do not drink alcohol if: Your health care provider tells you not to drink. If you drink alcohol: Limit how much you have to: 0-1 drink a day for women. 0-2 drinks a day for men. Know how much alcohol is in your drink. In the [place] one drink equals one 12 oz bottle of beer (355 mL), one 5 oz glass of wine (148 mL), or one 1 oz glass of hard liquor (44 mL). Do not use any products that contain nicotine or tobacco. These products include cigarettes, chewing tobacco, and vaping devices, such as e-cigarettes. If you need help quitting, ask your health care provider. Activity  Follow a regular exercise program to stay fit. This will help you maintain your balance. Ask your health care provider what types of exercise are appropriate for you. If you need a cane or walker, use it as recommended by your health  care provider. Wear supportive shoes that have nonskid soles. Safety  Remove any tripping hazards, such as rugs, cords, and clutter. Install safety equipment such as grab bars in bathrooms and safety rails on stairs. Keep rooms and walkways well-lit. General instructions Talk with your health care provider about your risks for falling. Tell your health care provider if: You fall. Be sure to tell your health care provider about all  falls, even ones that seem minor. You feel dizzy, tiredness (fatigue), or off-balance. Take over-the-counter and prescription medicines only as told by your health care provider. These include supplements. Eat a healthy diet and maintain a healthy weight. A healthy diet includes low-fat dairy products, low-fat (lean) meats, and fiber from whole grains, beans, and lots of fruits and vegetables. Stay current with your vaccines. Schedule regular health, dental, and eye exams. Summary Having a healthy lifestyle and getting preventive care can help to protect your health and wellness after age 82. Screening and testing are the best way to find a health problem early and help you avoid having a fall. Early diagnosis and treatment give you the best chance for managing medical conditions that are more common for people who are older than age 87. Falls are a major cause of broken bones and head injuries in people who are older than age 46. Take precautions to prevent a fall at home. Work with your health care provider to learn what changes you can make to improve your health and wellness and to prevent falls. This information is not intended to replace advice given to you by your health care provider. Make sure you discuss any questions you have with your health care provider. Document Revised: 08/12/2020 Document Reviewed: 08/12/2020 Elsevier Patient Education  2024 [employer].

## 2023-11-15 NOTE — Telephone Encounter (Signed)
Ok to add lipid panel

## 2023-11-15 NOTE — Telephone Encounter (Signed)
 Lipid panel added as a future order.

## 2023-11-15 NOTE — Telephone Encounter (Signed)
 Patient contacted the office stating she went to have labs with her PCP. She states they would not draw the labs we needed for her appointment coming up on 11/29/2023. Patient plans to come to the office tomorrow to have them done so they can be discussed at her upcoming appointment. Patient would like to know if we can add a lipid panel as she did not have it done with her PCP.

## 2023-11-16 ENCOUNTER — Encounter: Payer: Self-pay | Admitting: Family Medicine

## 2023-11-16 ENCOUNTER — Other Ambulatory Visit: Payer: Self-pay | Admitting: *Deleted

## 2023-11-16 DIAGNOSIS — M51369 Other intervertebral disc degeneration, lumbar region without mention of lumbar back pain or lower extremity pain: Secondary | ICD-10-CM

## 2023-11-16 DIAGNOSIS — R768 Other specified abnormal immunological findings in serum: Secondary | ICD-10-CM

## 2023-11-16 DIAGNOSIS — M5030 Other cervical disc degeneration, unspecified cervical region: Secondary | ICD-10-CM

## 2023-11-16 DIAGNOSIS — R5383 Other fatigue: Secondary | ICD-10-CM

## 2023-11-16 DIAGNOSIS — M19041 Primary osteoarthritis, right hand: Secondary | ICD-10-CM

## 2023-11-16 DIAGNOSIS — M19071 Primary osteoarthritis, right ankle and foot: Secondary | ICD-10-CM

## 2023-11-16 DIAGNOSIS — M797 Fibromyalgia: Secondary | ICD-10-CM

## 2023-11-16 DIAGNOSIS — M3500 Sicca syndrome, unspecified: Secondary | ICD-10-CM

## 2023-11-16 DIAGNOSIS — M19042 Primary osteoarthritis, left hand: Secondary | ICD-10-CM

## 2023-11-16 DIAGNOSIS — M47816 Spondylosis without myelopathy or radiculopathy, lumbar region: Secondary | ICD-10-CM

## 2023-11-16 DIAGNOSIS — M7061 Trochanteric bursitis, right hip: Secondary | ICD-10-CM

## 2023-11-17 ENCOUNTER — Ambulatory Visit: Payer: Self-pay | Admitting: Physician Assistant

## 2023-11-17 NOTE — Progress Notes (Signed)
Complements WNL

## 2023-11-17 NOTE — Progress Notes (Signed)
 Total cholesterol is borderline elevated-205.  Triglycerides elevated-219. LDL 112. Please notify the patient and forward results to PCP as requested.  CBC and CMP WNL ESR WNL No proteinuria.

## 2023-11-17 NOTE — Progress Notes (Signed)
 Ro antibody negative.  dsDNA negative

## 2023-11-19 LAB — COMPREHENSIVE METABOLIC PANEL WITH GFR
AG Ratio: 1.9 (calc) (ref 1.0–2.5)
ALT: 20 U/L (ref 6–29)
AST: 23 U/L (ref 10–35)
Albumin: 4.5 g/dL (ref 3.6–5.1)
Alkaline phosphatase (APISO): 114 U/L (ref 37–153)
BUN: 17 mg/dL (ref 7–25)
CO2: 31 mmol/L (ref 20–32)
Calcium: 9.7 mg/dL (ref 8.6–10.4)
Chloride: 103 mmol/L (ref 98–110)
Creat: 0.83 mg/dL (ref 0.50–1.05)
Globulin: 2.4 g/dL (ref 1.9–3.7)
Glucose, Bld: 93 mg/dL (ref 65–99)
Potassium: 4.5 mmol/L (ref 3.5–5.3)
Sodium: 140 mmol/L (ref 135–146)
Total Bilirubin: 0.3 mg/dL (ref 0.2–1.2)
Total Protein: 6.9 g/dL (ref 6.1–8.1)
eGFR: 78 mL/min/1.73m2 (ref >=60)

## 2023-11-19 LAB — CBC WITH DIFFERENTIAL/PLATELET
Absolute Lymphocytes: 1099 {cells}/uL (ref 850–3900)
Absolute Monocytes: 529 {cells}/uL (ref 200–950)
Basophils Absolute: 47 {cells}/uL (ref 0–200)
Basophils Relative: 0.7 %
Eosinophils Absolute: 161 {cells}/uL (ref 15–500)
Eosinophils Relative: 2.4 %
HCT: 44.1 % (ref 35.0–45.0)
Hemoglobin: 14.4 g/dL (ref 11.7–15.5)
MCH: 31.5 pg (ref 27.0–33.0)
MCHC: 32.7 g/dL (ref 32.0–36.0)
MCV: 96.5 fL (ref 80.0–100.0)
MPV: 11.8 fL (ref 7.5–12.5)
Monocytes Relative: 7.9 %
Neutro Abs: 4864 {cells}/uL (ref 1500–7800)
Neutrophils Relative %: 72.6 %
Platelets: 205 Thousand/uL (ref 140–400)
RBC: 4.57 Million/uL (ref 3.80–5.10)
RDW: 12 % (ref 11.0–15.0)
Total Lymphocyte: 16.4 %
WBC: 6.7 Thousand/uL (ref 3.8–10.8)

## 2023-11-19 LAB — ANA: Anti Nuclear Antibody (ANA): POSITIVE — AB

## 2023-11-19 LAB — LIPID PANEL
Cholesterol: 205 mg/dL — ABNORMAL HIGH (ref <200)
HDL: 59 mg/dL (ref >=50)
LDL Cholesterol (Calc): 112 mg/dL — ABNORMAL HIGH
Non-HDL Cholesterol (Calc): 146 mg/dL — ABNORMAL HIGH (ref <130)
Total CHOL/HDL Ratio: 3.5 (calc) (ref <5.0)
Triglycerides: 219 mg/dL — ABNORMAL HIGH (ref <150)

## 2023-11-19 LAB — PROTEIN / CREATININE RATIO, URINE
Creatinine, Urine: 15 mg/dL — ABNORMAL LOW (ref 20–275)
Total Protein, Urine: <4 mg/dL — ABNORMAL LOW (ref 5–24)

## 2023-11-19 LAB — ANTI-DNA ANTIBODY, DOUBLE-STRANDED: ds DNA Ab: 1 [IU]/mL

## 2023-11-19 LAB — SEDIMENTATION RATE: Sed Rate: 9 mm/h (ref 0–30)

## 2023-11-19 LAB — SJOGRENS SYNDROME-A EXTRACTABLE NUCLEAR ANTIBODY: SSA (Ro) (ENA) Antibody, IgG: <1 AI

## 2023-11-19 LAB — ANTI-NUCLEAR AB-TITER (ANA TITER): ANA Titer 1: 1:80 {titer} — ABNORMAL HIGH

## 2023-11-19 LAB — C3 AND C4
C3 Complement: 131 mg/dL (ref 83–193)
C4 Complement: 28 mg/dL (ref 15–57)

## 2023-11-22 NOTE — Progress Notes (Signed)
 ANA remains positive-low titer.

## 2023-11-25 ENCOUNTER — Ambulatory Visit (HOSPITAL_COMMUNITY)
Admission: RE | Admit: 2023-11-25 | Discharge: 2023-11-25 | Disposition: A | Discharge status: Home / Self Care | Source: Ambulatory Visit | Attending: Physician Assistant | Admitting: Physician Assistant

## 2023-11-25 DIAGNOSIS — R160 Hepatomegaly, not elsewhere classified: Secondary | ICD-10-CM | POA: Insufficient documentation

## 2023-11-25 DIAGNOSIS — R932 Abnormal findings on diagnostic imaging of liver and biliary tract: Secondary | ICD-10-CM | POA: Diagnosis present

## 2023-11-25 DIAGNOSIS — R772 Abnormality of alphafetoprotein: Secondary | ICD-10-CM | POA: Insufficient documentation

## 2023-11-25 MED ORDER — GADOXETATE DISODIUM 0.25 MMOL/ML IV SOLN
7.0000 mL | Freq: Once | INTRAVENOUS | Status: AC | PRN
  Administered 2023-11-25: 7 mL via INTRAVENOUS

## 2023-11-29 ENCOUNTER — Encounter: Payer: Self-pay | Admitting: Physician Assistant

## 2023-11-29 ENCOUNTER — Ambulatory Visit: Payer: 59 | Attending: Physician Assistant | Admitting: Physician Assistant

## 2023-11-29 ENCOUNTER — Other Ambulatory Visit: Payer: Self-pay

## 2023-11-29 VITALS — BP 99/64 | HR 59 | Resp 14 | Ht 64.0 in | Wt 148.0 lb

## 2023-11-29 DIAGNOSIS — M7061 Trochanteric bursitis, right hip: Secondary | ICD-10-CM

## 2023-11-29 DIAGNOSIS — M21371 Foot drop, right foot: Secondary | ICD-10-CM

## 2023-11-29 DIAGNOSIS — M5030 Other cervical disc degeneration, unspecified cervical region: Secondary | ICD-10-CM

## 2023-11-29 DIAGNOSIS — M7062 Trochanteric bursitis, left hip: Secondary | ICD-10-CM

## 2023-11-29 DIAGNOSIS — M19071 Primary osteoarthritis, right ankle and foot: Secondary | ICD-10-CM | POA: Diagnosis not present

## 2023-11-29 DIAGNOSIS — M3500 Sicca syndrome, unspecified: Secondary | ICD-10-CM | POA: Diagnosis not present

## 2023-11-29 DIAGNOSIS — M19041 Primary osteoarthritis, right hand: Secondary | ICD-10-CM

## 2023-11-29 DIAGNOSIS — Z8669 Personal history of other diseases of the nervous system and sense organs: Secondary | ICD-10-CM

## 2023-11-29 DIAGNOSIS — R5383 Other fatigue: Secondary | ICD-10-CM

## 2023-11-29 DIAGNOSIS — M4185 Other forms of scoliosis, thoracolumbar region: Secondary | ICD-10-CM

## 2023-11-29 DIAGNOSIS — M542 Cervicalgia: Secondary | ICD-10-CM

## 2023-11-29 DIAGNOSIS — M19042 Primary osteoarthritis, left hand: Secondary | ICD-10-CM

## 2023-11-29 DIAGNOSIS — M797 Fibromyalgia: Secondary | ICD-10-CM

## 2023-11-29 DIAGNOSIS — M47816 Spondylosis without myelopathy or radiculopathy, lumbar region: Secondary | ICD-10-CM

## 2023-11-29 DIAGNOSIS — M818 Other osteoporosis without current pathological fracture: Secondary | ICD-10-CM

## 2023-11-29 DIAGNOSIS — M19072 Primary osteoarthritis, left ankle and foot: Secondary | ICD-10-CM

## 2023-11-29 DIAGNOSIS — Z87898 Personal history of other specified conditions: Secondary | ICD-10-CM

## 2023-11-29 DIAGNOSIS — E559 Vitamin D deficiency, unspecified: Secondary | ICD-10-CM

## 2023-11-29 DIAGNOSIS — R29898 Other symptoms and signs involving the musculoskeletal system: Secondary | ICD-10-CM

## 2023-12-03 ENCOUNTER — Telehealth: Payer: Self-pay | Admitting: Physician Assistant

## 2023-12-03 NOTE — Telephone Encounter (Signed)
 Referral re faxed.

## 2023-12-03 NOTE — Telephone Encounter (Signed)
 Pt called stating her referral to [facility] was not received to them and if we could refax it over.

## 2023-12-07 ENCOUNTER — Telehealth: Payer: Self-pay | Admitting: Rheumatology

## 2023-12-07 NOTE — Telephone Encounter (Signed)
 Ellouise from [facility] stated they did not receive patients referral. She left two fax numbers. [phone] or [phone]

## 2023-12-07 NOTE — Telephone Encounter (Signed)
 Re-faxed referral again.

## 2023-12-12 IMAGING — DX DG CHEST 2V
2 series · 2 of 2 positions shown · non-contrast
Comparison: 04/18/2021

CLINICAL DATA: Right lower lobe pneumonia

EXAM:
CHEST - 2 VIEW

[chest pa]
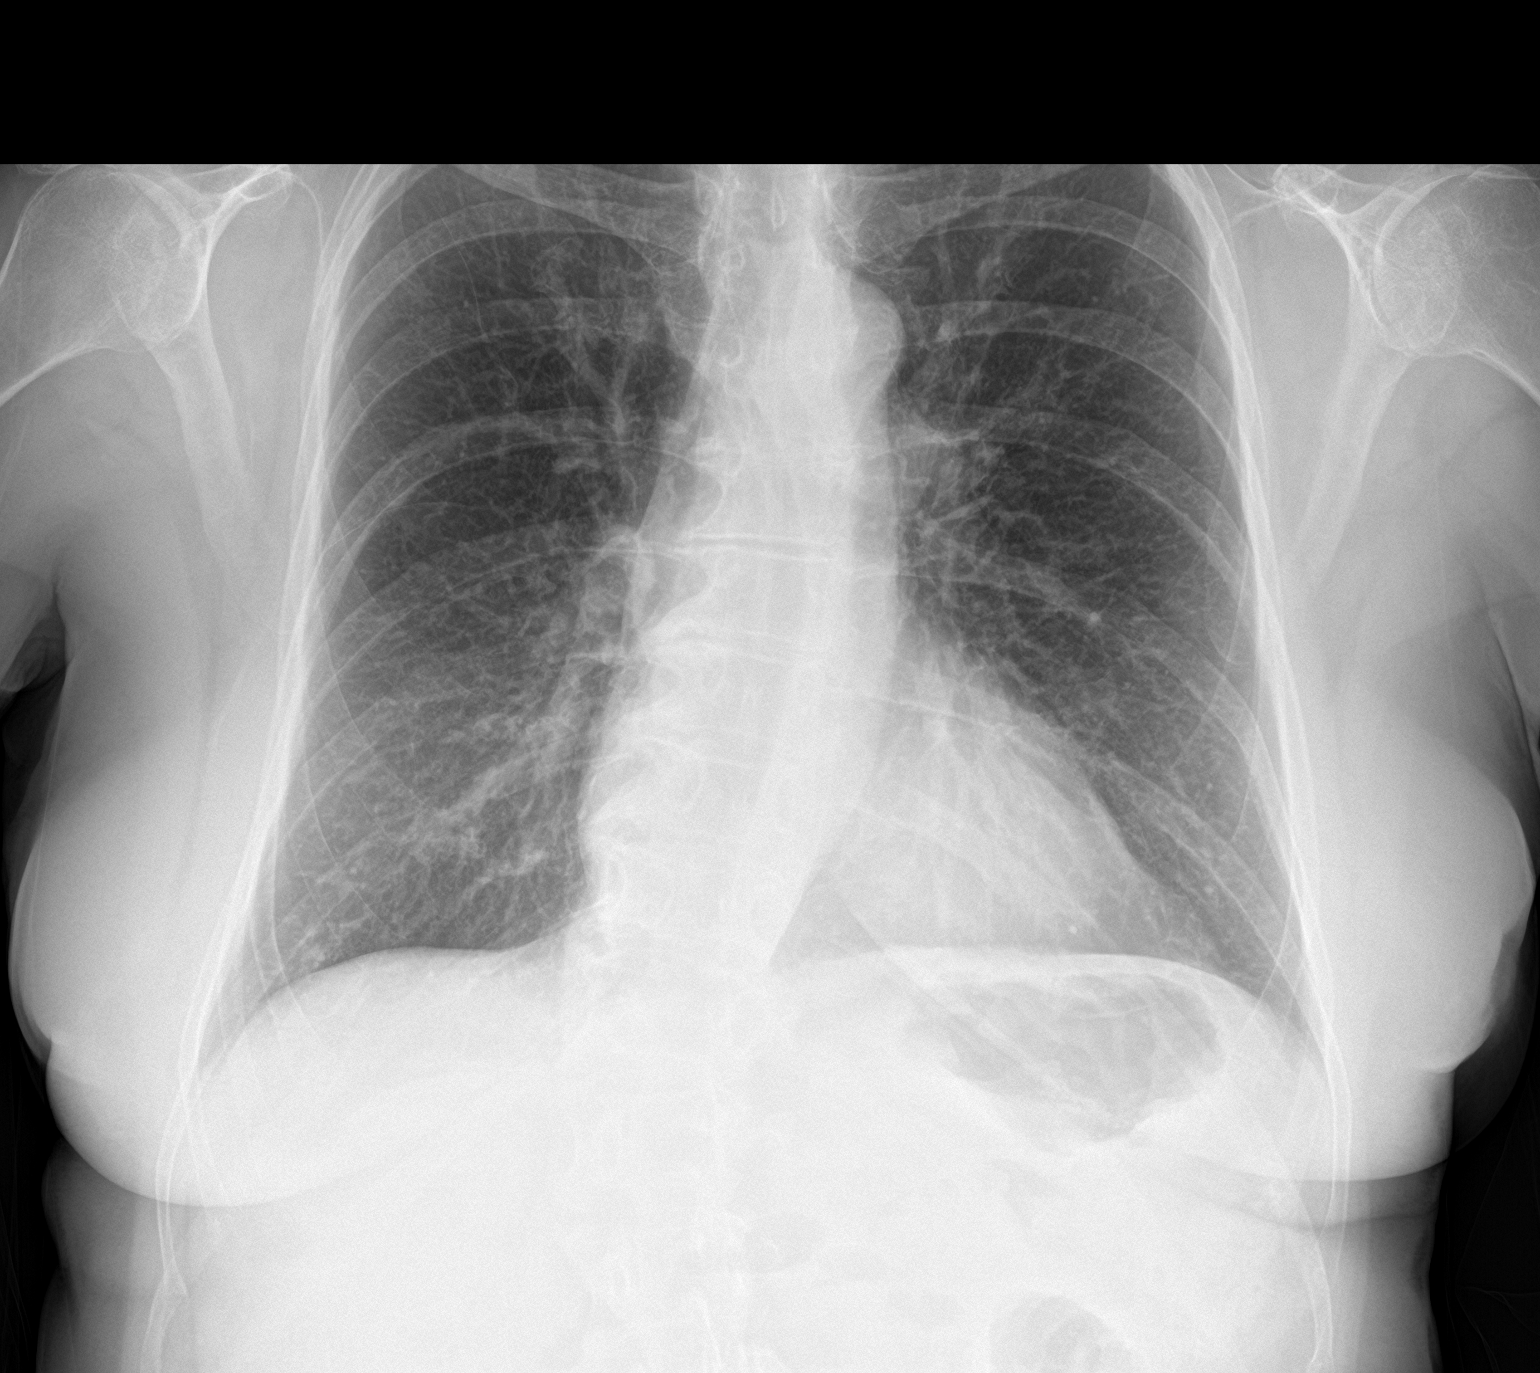

[chest lat]
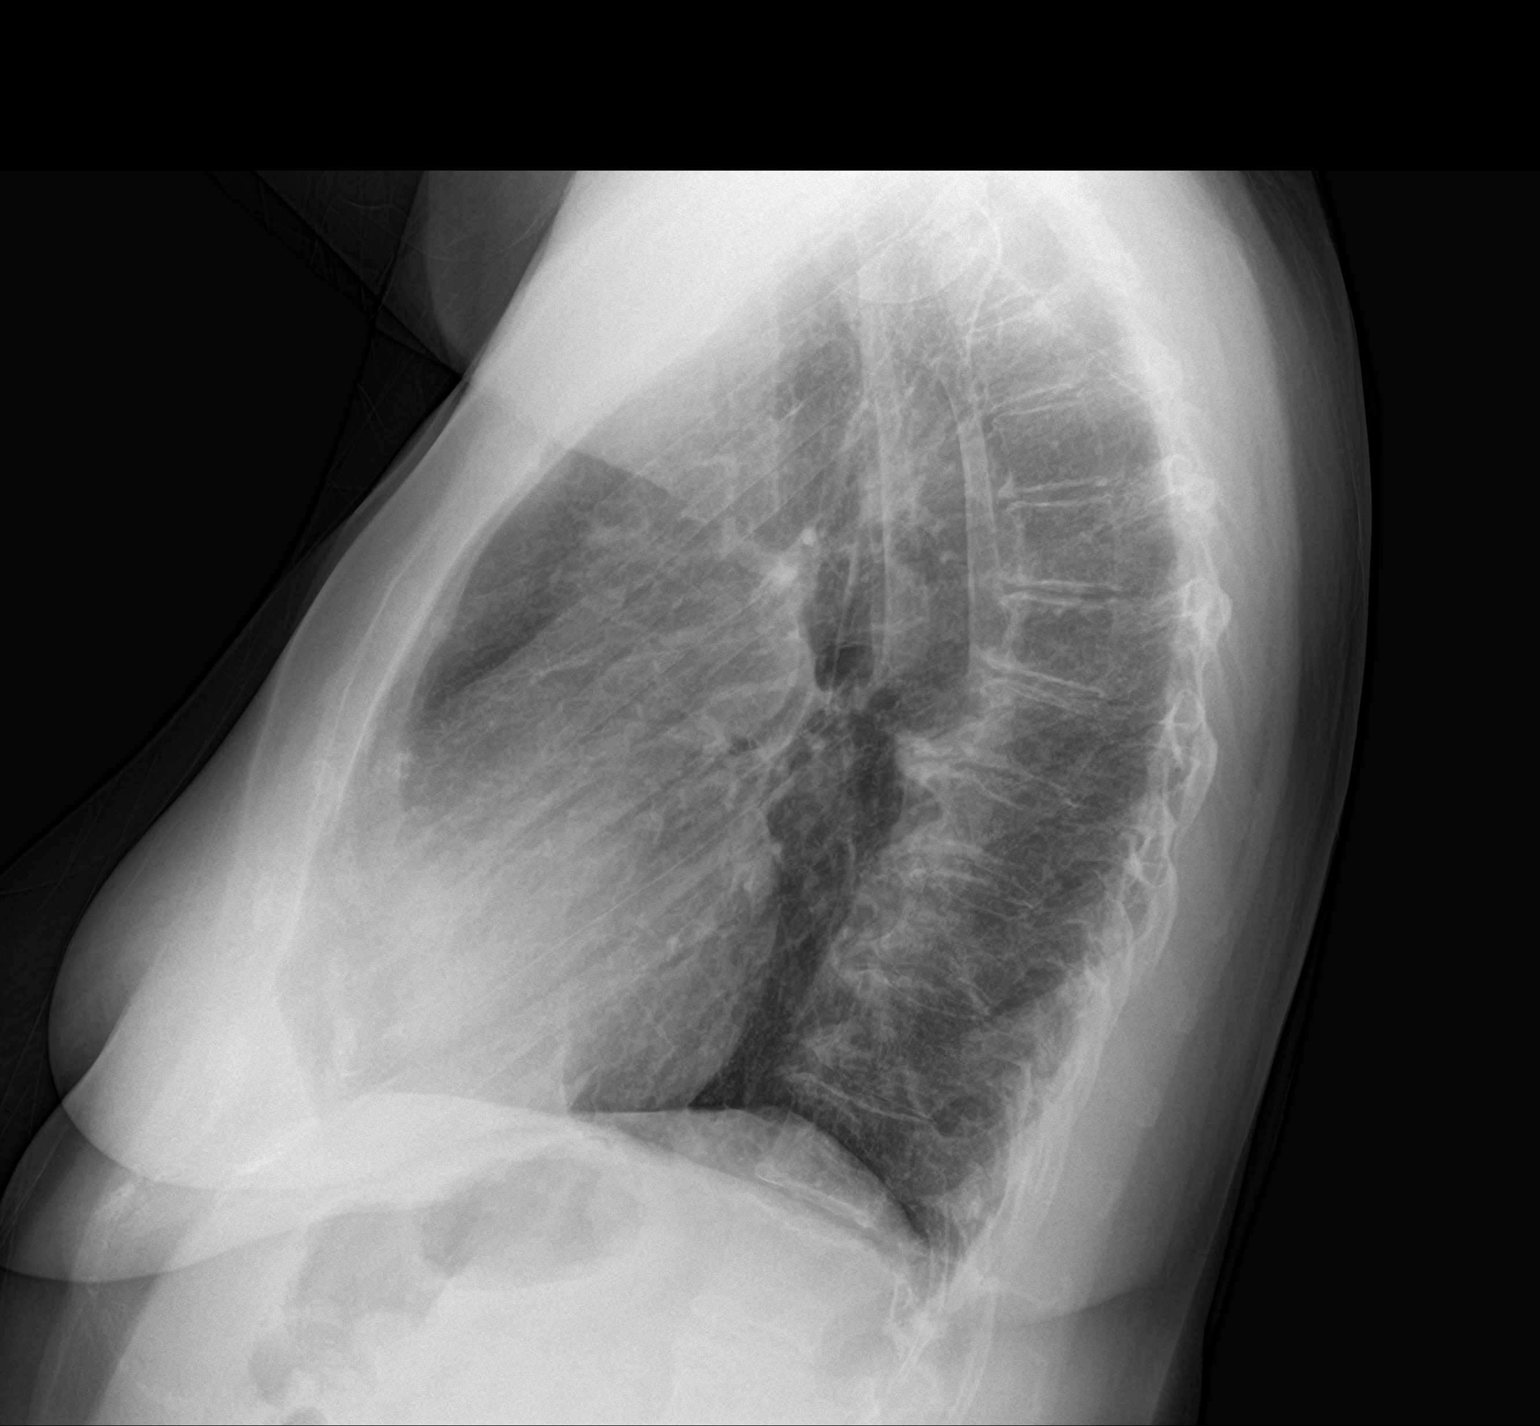

[2 of 2 positions shown; findings below may reference images not displayed]

FINDINGS: Improvement in the faint right basilar opacity. There are also
overlying costal cartilage calcifications in this region. Mild
hyperinflation. No current acute pneumonia, collapse or
consolidation. Normal heart size and vascularity. No edema, effusion
or pneumothorax. Trachea midline. Degenerative changes of the spine
associated scoliosis.
IMPRESSION: Mild hyperinflation. No acute chest process.

## 2023-12-20 ENCOUNTER — Other Ambulatory Visit (HOSPITAL_BASED_OUTPATIENT_CLINIC_OR_DEPARTMENT_OTHER): Payer: Self-pay

## 2023-12-20 MED ORDER — FLUZONE HIGH-DOSE 0.5 ML IM SUSY
0.5000 mL | PREFILLED_SYRINGE | Freq: Once | INTRAMUSCULAR | 0 refills | Status: AC
  Filled 2023-12-20: qty 0.5, 1d supply, fill #0

## 2024-01-10 ENCOUNTER — Other Ambulatory Visit (HOSPITAL_BASED_OUTPATIENT_CLINIC_OR_DEPARTMENT_OTHER): Payer: Self-pay

## 2024-01-10 MED ORDER — CYCLOSPORINE 0.05 % OP EMUL
1.0000 [drp] | Freq: Two times a day (BID) | OPHTHALMIC | 12 refills | Status: AC
  Filled 2024-01-10: qty 60, 30d supply, fill #0
  Filled 2024-05-12: qty 60, 30d supply, fill #1

## 2024-01-10 MED ORDER — FLUTICASONE PROPIONATE 50 MCG/ACT NA SUSP
2.0000 | Freq: Every day | NASAL | 11 refills | Status: AC
  Filled 2024-01-10: qty 48, 90d supply, fill #0

## 2024-01-10 MED ORDER — ALBUTEROL SULFATE HFA 108 (90 BASE) MCG/ACT IN AERS
1.0000 | INHALATION_SPRAY | Freq: Four times a day (QID) | RESPIRATORY_TRACT | 2 refills | Status: DC | PRN
  Filled 2024-01-10: qty 6.7, 25d supply, fill #0

## 2024-01-17 ENCOUNTER — Other Ambulatory Visit (HOSPITAL_BASED_OUTPATIENT_CLINIC_OR_DEPARTMENT_OTHER): Payer: Self-pay

## 2024-04-12 ENCOUNTER — Ambulatory Visit: Payer: Self-pay

## 2024-04-12 NOTE — Telephone Encounter (Signed)
 FYI Only or Action Required?: FYI only for provider: appointment scheduled on 04/13/24.  Patient was last seen in primary care on 11/15/2023 by Levora Reyes SAUNDERS, MD.  Called Nurse Triage reporting Cough.  Symptoms began a week ago.  Interventions attempted: OTC medications: ginger, Vit C, Prescription medications: albuterol , and Rest, hydration, or home remedies.  Symptoms are: unchanged.  Triage Disposition: See Physician Within 24 Hours  Patient/caregiver understands and will follow disposition?: Yes    Copied from CRM #[id number]. Topic: Clinical - Red Word Triage >> Apr 12, 2024  4:01 PM Frederich PARAS wrote: [facility] that prompted transfer to Nurse Triage: swollen, pain  coughing more, swollen glands, no flu or covid, weezing more, drainage more, no fever,throat  tender and sore .    Reason for Disposition  [1] Continuous (nonstop) coughing interferes with work or school AND [2] no improvement using cough treatment per Care Advice  Answer Assessment - Initial Assessment Questions Pt called in to report cough present since NYE. Pt reports taking covid/flu test at that time d/t runny nose and cough but tests negative. Pt reports she has tried ginger, vit C and using albuterol  PRN. Pt states cough has persisted and today she has a minor sore throat. Pt states she retested and flu/covid home test negative. Discussed appt with alternative provider for soonest available and pt agreeable. Appointment scheduled for evaluation. Patient agrees with plan of care, and will call back if anything changes, or if symptoms worsen.     1. ONSET: When did the cough begin?      1 week ago  2. SEVERITY: How bad is the cough today?      Mild  3. SPUTUM: Describe the color of your sputum (e.g., none, dry cough; clear, white, yellow, green)     Dry cough   4. HEMOPTYSIS: Are you coughing up any blood? If Yes, ask: How much? (e.g., flecks, streaks, tablespoons, etc.)     No   5. DIFFICULTY  BREATHING: Are you having difficulty breathing? If Yes, ask: How bad is it? (e.g., mild, moderate, severe)      No   6. FEVER: Do you have a fever? If Yes, ask: What is your temperature, how was it measured, and when did it start?     No   7. CARDIAC HISTORY: Do you have any history of heart disease? (e.g., heart attack, congestive heart failure)      No   8. LUNG HISTORY: Do you have any history of lung disease?  (e.g., pulmonary embolus, asthma, emphysema)     Asthma; pt has albuterol    9. PE RISK FACTORS: Do you have a history of blood clots? (or: recent major surgery, recent prolonged travel, bedridden)     No   10. OTHER SYMPTOMS: Do you have any other symptoms? (e.g., runny nose, wheezing, chest pain)       Runny nose sore throat  Protocols used: Cough - Acute Non-Productive-A-AH

## 2024-04-12 NOTE — Telephone Encounter (Signed)
 FYI - patient has appt tomorrow swollen, pain  coughing more, swollen glands, no flu or covid, weezing more, drainage more, no fever,throat  tender and sore

## 2024-04-13 ENCOUNTER — Encounter: Payer: Self-pay | Admitting: Student in an Organized Health Care Education/Training Program

## 2024-04-13 ENCOUNTER — Ambulatory Visit (INDEPENDENT_AMBULATORY_CARE_PROVIDER_SITE_OTHER): Admitting: Student in an Organized Health Care Education/Training Program

## 2024-04-13 VITALS — BP 127/75 | HR 62 | Wt 152.0 lb

## 2024-04-13 DIAGNOSIS — J069 Acute upper respiratory infection, unspecified: Secondary | ICD-10-CM | POA: Insufficient documentation

## 2024-04-13 NOTE — Progress Notes (Signed)
 "  Acute Office Visit  Patient ID: Alexis Hicks, female    DOB: Jul 22, 1960, 64 y.o.   MRN: [id number]  PCP: Levora Reyes SAUNDERS, MD  Chief Complaint  Patient presents with   Cough    swollen, pain  coughing more, swollen glands, no flu or covid, weezing more, drainage more, no fever,throat  tender and sore Pressure around ears.     Subjective:     HPI  Discussed the use of AI scribe software for clinical note transcription with the patient, who gave verbal consent to proceed.  History of Present Illness Alexis Hicks is a 64 year old female with asthma who presents with one month of viral respiratory infection symptoms.  Symptoms began around December 18th or 19th with a runny nose. She has taken multiple tests for flu and COVID, all of which were negative. Her symptoms have included coughing, which intensified over the past week, and a runny nose. She also experienced shivering at night but no high fever, with her temperature reading at 9F one morning.  Her son, who lives with her, also experienced similar symptoms including sneezing and coughing, and he suspected a slight fever. He also tested negative for flu and COVID.  She describes experiencing a little pressure in her ears and a pain in her sinuses that started just this morning. She noted that her glands felt tight and tender yesterday afternoon, and she has a mild sore throat. Despite these symptoms, she has continued her exercise routine.  No fever and reports that her nasal congestion is periodic, with her nose running intermittently. She has not experienced shortness of breath but has used her albuterol  inhaler more frequently over the past couple of days due to perceived difficulty in breathing during activities around the house. She typically uses the inhaler before exercise, but recently needed it due to increased symptoms.  She is currently using albuterol  as a rescue inhaler, taking two puffs as  needed. She also uses Flonase  once a day for her sinuses. She reports no significant changes in her health otherwise and is functionally doing well, maintaining her usual activities and exercise.  She mentions hearing a fluid-like sound in her ear over the past few weeks. She has had issues with antibiotics in the past, particularly with heavy ones causing stomach upset.      Objective:    BP 127/75   Pulse 62   Wt 152 lb (68.9 kg)   BMI 26.09 kg/m    Physical Exam  Gen: Well-appearing woman Ears: Left tympanic membrane is normal, right tympanic membrane is moderately erythematous, mildly bulging, no middle ear effusion Mouth: No erythema or lesions Neck: Mild tender lymphadenopathy on the left side of the anterior neck Heart: Regular, no murmur Lungs: Unlabored, clear with no crackles or wheezing     Assessment & Plan:   Problem List Items Addressed This Visit       Unprioritized   URTI (acute upper respiratory infection) - Primary   Symptoms suggest a viral prolonged upper respiratory tract infection, complicated by mild otitis media on the right side.  Based on the exam I think it still could be a viral etiology.  She really has pretty minimal symptoms on the right side of discomfort so I think antibiotics are optional due to the absence of significant pain or systemic symptoms. Continue Flonase  once daily, increasing to twice daily if symptoms persist. Perform sinus irrigation with saline nasal spray before using Flonase . Use ibuprofen  100  mg two to three times daily for inflammation and pain, and Tylenol two tablets two to three times daily for pain management. Monitor for fever or increased shortness of breath and contact the provider if these occur.  No signs of asthma exacerbation.  She has had some side effects with antibiotics in the past including diarrhea, so we decided to hold off on using antibiotics, no need for prednisone.  Continue with supportive care and I think  this has a good chance of improving in the coming days.       Return if symptoms worsen or fail to improve.  Cleatus Debby Specking, MD [facility]   "

## 2024-04-13 NOTE — Assessment & Plan Note (Signed)
 Symptoms suggest a viral prolonged upper respiratory tract infection, complicated by mild otitis media on the right side.  Based on the exam I think it still could be a viral etiology.  She really has pretty minimal symptoms on the right side of discomfort so I think antibiotics are optional due to the absence of significant pain or systemic symptoms. Continue Flonase  once daily, increasing to twice daily if symptoms persist. Perform sinus irrigation with saline nasal spray before using Flonase . Use ibuprofen  100 mg two to three times daily for inflammation and pain, and Tylenol two tablets two to three times daily for pain management. Monitor for fever or increased shortness of breath and contact the provider if these occur.  No signs of asthma exacerbation.  She has had some side effects with antibiotics in the past including diarrhea, so we decided to hold off on using antibiotics, no need for prednisone.  Continue with supportive care and I think this has a good chance of improving in the coming days.

## 2024-04-13 NOTE — Patient Instructions (Signed)
" °  VISIT SUMMARY: Today, you were seen for symptoms of a viral respiratory infection that have been ongoing for about a month. You have experienced a runny nose, coughing, mild sore throat, and some ear and sinus discomfort. Your son has had similar symptoms, and both of you tested negative for flu and COVID. You have been using your albuterol  inhaler more frequently but have not experienced shortness of breath. Your asthma remains well-controlled.  YOUR PLAN: -ACUTE VIRAL UPPER RESPIRATORY INFECTION WITH OTITIS MEDIA: You have a viral infection affecting your upper respiratory system, which may have led to a mild ear infection. Continue using Flonase  once daily, and you can increase it to twice daily if your symptoms persist. Perform sinus irrigation with saline nasal spray before using Flonase . For pain and inflammation, take ibuprofen  100 mg two to three times daily and Tylenol two tablets two to three times daily. Monitor for fever or increased shortness of breath and contact us  if these occur.  -ASTHMA: Your asthma is a chronic condition that affects your airways, making it hard to breathe at times. Your asthma is currently well-controlled, and there are no signs of worsening. Continue with your current asthma management plan, including using your albuterol  inhaler as needed.  INSTRUCTIONS: Please monitor your symptoms closely. If you develop a fever or experience increased shortness of breath, contact our office immediately. Continue with your current medications and follow the outlined plan for managing your symptoms.    "

## 2024-04-20 ENCOUNTER — Ambulatory Visit: Payer: Self-pay

## 2024-04-20 ENCOUNTER — Other Ambulatory Visit (HOSPITAL_BASED_OUTPATIENT_CLINIC_OR_DEPARTMENT_OTHER): Payer: Self-pay

## 2024-04-20 ENCOUNTER — Encounter: Payer: Self-pay | Admitting: Family Medicine

## 2024-04-20 ENCOUNTER — Ambulatory Visit: Admitting: Family Medicine

## 2024-04-20 VITALS — BP 110/76 | HR 68 | Temp 98.1°F | Resp 14 | Ht 64.0 in | Wt 154.2 lb

## 2024-04-20 DIAGNOSIS — R0981 Nasal congestion: Secondary | ICD-10-CM | POA: Diagnosis not present

## 2024-04-20 DIAGNOSIS — L814 Other melanin hyperpigmentation: Secondary | ICD-10-CM | POA: Insufficient documentation

## 2024-04-20 DIAGNOSIS — R059 Cough, unspecified: Secondary | ICD-10-CM

## 2024-04-20 DIAGNOSIS — L209 Atopic dermatitis, unspecified: Secondary | ICD-10-CM | POA: Insufficient documentation

## 2024-04-20 DIAGNOSIS — D1801 Hemangioma of skin and subcutaneous tissue: Secondary | ICD-10-CM | POA: Insufficient documentation

## 2024-04-20 DIAGNOSIS — Z808 Family history of malignant neoplasm of other organs or systems: Secondary | ICD-10-CM | POA: Insufficient documentation

## 2024-04-20 DIAGNOSIS — L821 Other seborrheic keratosis: Secondary | ICD-10-CM | POA: Insufficient documentation

## 2024-04-20 LAB — POCT INFLUENZA A/B
Influenza A, POC: NEGATIVE
Influenza B, POC: NEGATIVE

## 2024-04-20 LAB — POC COVID19 BINAXNOW: SARS Coronavirus 2 Ag: NEGATIVE

## 2024-04-20 MED ORDER — AZITHROMYCIN 250 MG PO TABS
ORAL_TABLET | ORAL | 0 refills | Status: AC
  Filled 2024-04-20: qty 6, 5d supply, fill #0

## 2024-04-20 MED ORDER — GUAIFENESIN-CODEINE 100-10 MG/5ML PO SOLN
5.0000 mL | Freq: Every evening | ORAL | 0 refills | Status: AC | PRN
  Filled 2024-04-20: qty 120, 12d supply, fill #0

## 2024-04-20 NOTE — Progress Notes (Signed)
 "  Subjective:  Patient ID: Alexis Hicks, female    DOB: 1961/01/02  Age: 64 y.o. MRN: [id number]  CC:  Chief Complaint  Patient presents with   Ear Pain    Both ears are throbbing. Post nasal drainage. Coughing. Hoarse voice. Sx started last week. Got worse monday. COVID and FLU test was negative last week. Son and husband have similar sx and were negative as well    HPI Alexis Hicks presents for   Cough, hoarseness, congestion, ear pain As above, symptoms past week.  Son and husband sick contacts with similar symptoms, they have improved. negative COVID, flu testing for her and her family last week,no recent testing. She feels like not improving. Ears feel clogged, sore throat this morning, change in voice this morning and yesterday.  No fever.   Tx: sudafed twice - past few days. Some relief.   Saw Dr. Jerrell on 1/8. Diagnosed with URI, prolonged viral URI, with nasal congestion for few weeks. Has been using flonase , tylenol, ibuprofen .   Overall feels a little worse since last visit with voice hoarseness, ears feel more clogged and more cough, more PND and runny nose. More fatigue few days ago.   Some difficulty with sleep.   PNA few years ago  Albuterol  inhaler about once per day.    History Patient Active Problem List   Diagnosis Date Noted   Atopic dermatitis 04/20/2024   Family history of skin cancer 04/20/2024   Hemangioma of skin and subcutaneous tissue 04/20/2024   Lentigo 04/20/2024   Seborrheic keratoses 04/20/2024   URTI (acute upper respiratory infection) 04/13/2024   Arthropod bite 10/25/2023   Disorder of bone and articular cartilage 02/25/2023   Osteoarthritis 02/25/2023   Skin lesion 12/02/2021   Benign mole 09/23/2021   Right foot drop 01/09/2021   Other osteoporosis without current pathological fracture 01/09/2021   Osteopenia of multiple sites 08/25/2016   Vitamin D  deficiency 08/25/2016   Fatigue 02/17/2016   Primary  osteoarthritis of both feet 02/17/2016   Weakness of right lower extremity 02/17/2016   Migraine without status migrainosus, not intractable 02/17/2016   Raynaud's  02/17/2016   Sicca syndrome 02/17/2016   DUB (dysfunctional uterine bleeding)    Fibromyalgia syndrome 09/10/2011   Primary osteoarthritis of both hands 09/10/2011   Scoliosis 09/10/2011   Past Medical History:  Diagnosis Date   Chronically dry eyes, right    Dry mouth    DUB (dysfunctional uterine bleeding)    Fibromyalgia    Osteoporosis 04/2018   Dr. Norine   Pneumonia 04/2021   Raynaud's syndrome    Past Surgical History:  Procedure Laterality Date   MOLE REMOVAL  12/02/2021   face   SALPINGECTOMY     Allergies[1] Prior to Admission medications  Medication Sig Start Date End Date Taking? Authorizing Provider  albuterol  (VENTOLIN  HFA) 108 (90 Base) MCG/ACT inhaler Inhale 1-2 puffs into the lungs every 6 (six) hours as needed for wheezing or shortness of breath. 06/18/23  Yes Ruthell Lauraine FALCON, NP  albuterol  (VENTOLIN  HFA) 108 [phone] Base) MCG/ACT inhaler Inhale 1-2 puffs into the lungs every 6 (six) hours as needed for shortness of breath or wheezing. 06/18/23  Yes Ruthell Lauraine FALCON, NP  ascorbic Acid (VITAMIN C) 500 MG CPCR as needed. Pt takes half of the dosage listed   Yes [provider]  B Complex Vitamins (VITAMIN-B COMPLEX PO) Take 1 capsule by mouth daily.   Yes [provider]  CALCIUM PO Take  350 mg by mouth daily.   Yes [provider]  cholecalciferol (VITAMIN D ) 1000 units tablet Take 2,000 Units by mouth daily.    Yes [provider]  cycloSPORINE  (RESTASIS ) 0.05 % ophthalmic emulsion 1 drop daily.   Yes [provider]  cycloSPORINE  (RESTASIS ) 0.05 % ophthalmic emulsion Place 1 drop into both eyes 2 (two) times daily. 11/27/23  Yes   fluticasone  (FLONASE ) 50 MCG/ACT nasal spray Place 2 sprays into both nostrils daily. 05/12/23  Yes Levora Reyes SAUNDERS, MD  fluticasone   (FLONASE ) 50 MCG/ACT nasal spray Place 2 sprays into both nostrils daily. 05/14/23  Yes Levora Reyes SAUNDERS, MD  triamcinolone cream (KENALOG) 0.1 % triamcinolone acetonide 0.1 % topical cream  APPLY TO SKIN TWICE DAILY X 14 DAYS   Yes [provider]  Zinc 10 MG LOZG Use as directed in the mouth or throat as needed.   Yes [provider]  Cyanocobalamin (VITAMIN B 12 PO) Take by mouth. Patient not taking: Reported on 04/20/2024    [provider]   Social History   Socioeconomic History   Marital status: Married    Spouse name: Not on file   Number of children: Not on file   Years of education: Not on file   Highest education level: Not on file  Occupational History   Not on file  Tobacco Use   Smoking status: Never    Passive exposure: Past   Smokeless tobacco: Never  Vaping Use   Vaping status: Never Used  Substance and Sexual Activity   Alcohol use: Yes    Comment: very rarely   Drug use: No   Sexual activity: Not Currently  Other Topics Concern   Not on file  Social History Narrative   Not on file   Social Drivers of Health   Tobacco Use: Low Risk (04/20/2024)   Patient History    Smoking Tobacco Use: Never    Smokeless Tobacco Use: Never    Passive Exposure: Past  Financial Resource Strain: Low Risk (11/15/2023)   Overall Financial Resource Strain (CARDIA)    Difficulty of Paying Living Expenses: Not hard at all  Food Insecurity: No Food Insecurity (11/15/2023)   Epic    Worried About Programme Researcher, Broadcasting/film/video in the Last Year: Never true    Ran Out of Food in the Last Year: Never true  Transportation Needs: No Transportation Needs (11/15/2023)   Epic    Lack of Transportation (Medical): No    Lack of Transportation (Non-Medical): No  Physical Activity: Not on file  Stress: Not on file  Social Connections: Not on file  Intimate Partner Violence: Not At Risk (11/15/2023)   Epic    Fear of Current or Ex-Partner: No    Emotionally Abused: No     Physically Abused: No    Sexually Abused: No  Depression (PHQ2-9): Low Risk (02/26/2023)   Depression (PHQ2-9)    PHQ-2 Score: 0  Alcohol Screen: Not on file  Housing: Low Risk (11/15/2023)   Epic    Unable to Pay for Housing in the Last Year: No    Number of Times Moved in the Last Year: 0    Homeless in the Last Year: No  Utilities: Not At Risk (11/15/2023)   Epic    Threatened with loss of utilities: No  Health Literacy: Not on file    Review of Systems Per HPI  Objective:   Vitals:   04/20/24 1148  BP: 110/76  Pulse: 68  Resp: 14  Temp: 98.1 F (36.7 C)  TempSrc: Temporal  SpO2: 99%  Weight: 154 lb 3.2 oz (69.9 kg)  Height: 5' 4 (1.626 m)     Physical Exam Vitals reviewed.  Constitutional:      General: She is not in acute distress.    Appearance: Normal appearance. She is well-developed. She is not toxic-appearing.  HENT:     Head: Normocephalic and atraumatic.     Right Ear: Hearing, tympanic membrane, ear canal and external ear normal.     Left Ear: Hearing, tympanic membrane, ear canal and external ear normal.     Ears:     Comments: Possible minimal clear fluid at the base of the right TM but no retraction, erythema or injection and canals clear.    Nose: Nose normal.     Mouth/Throat:     Pharynx: No posterior oropharyngeal erythema.  Eyes:     Conjunctiva/sclera: Conjunctivae normal.     Pupils: Pupils are equal, round, and reactive to light.  Cardiovascular:     Rate and Rhythm: Normal rate and regular rhythm.     Heart sounds: Normal heart sounds. No murmur heard. Pulmonary:     Effort: Pulmonary effort is normal. No respiratory distress.     Breath sounds: Normal breath sounds. No stridor. No wheezing or rhonchi.  Skin:    General: Skin is warm and dry.     Findings: No rash.  Neurological:     Mental Status: She is alert and oriented to person, place, and time.  Psychiatric:        Mood and Affect: Mood normal.        Behavior: Behavior  normal.    Results for orders placed or performed in visit on 04/20/24  POCT Influenza A/B   Collection Time: 04/20/24 12:25 PM  Result Value Ref Range   Influenza A, POC Negative Negative   Influenza B, POC Negative Negative  POC COVID-19 BinaxNow   Collection Time: 04/20/24 12:25 PM  Result Value Ref Range   SARS Coronavirus 2 Ag Negative Negative      Assessment & Plan:  Alexis Hicks is a 64 y.o. female . Nasal congestion - Plan: POCT Influenza A/B, POC COVID-19 BinaxNow, guaiFENesin -codeine  100-10 MG/5ML syrup, azithromycin  (ZITHROMAX ) 250 MG tablet  Cough, unspecified type - Plan: guaiFENesin -codeine  100-10 MG/5ML syrup, azithromycin  (ZITHROMAX ) 250 MG tablet  Initial likely viral URI with prolonged symptoms versus secondary viral infection after previous infection.  Negative COVID/flu testing as above.  Given persistent symptoms and possible early worsening, prior history of pneumonia, we decided to start azithromycin  with potential side effects and risks of antibiotics discussed.  Symptomatic care for cough with Mucinex , saline nasal spray, okay to continue Flonase  for nasal congestion.  Codeine  cough syrup if needed at night if cough is interrupting sleep with potential side effects discussed.  RTC precautions given.  She does have a history of pneumonia but lungs were clear at this time, afebrile, O2 sat normal and reassuring exam.  Meds ordered this encounter  Medications   guaiFENesin -codeine  100-10 MG/5ML syrup    Sig: Take 5-10 mLs by mouth at bedtime as needed.    Dispense:  120 mL    Refill:  0   azithromycin  (ZITHROMAX ) 250 MG tablet    Sig: Take 2 tablets by mouth on day 1, then 1 tablet daily on days 2 through 5.    Dispense:  6 tablet    Refill:  0   Patient  Instructions  Although your exam is overall reassuring, with the persistent symptoms, possibly worsening symptoms and the timing of your symptoms I think would be reasonable to start an antibiotic  at this point.  I will send in the azithromycin  which you have taken previously.  Mucinex  is fine over-the-counter as needed for cough, saline nasal spray, Flonase  if needed.  I did send in some codeine  cough syrup if needed at night.  If you have worsening symptoms including shortness of breath, fevers, or any progression of symptoms into next week, follow-up and we can decide on imaging or further testing.  I expect you to improve soon.  Hang in there!    Signed,   Reyes Pines, MD [facility], [facility] 04/20/24 12:33 PM      [1] No Known Allergies  "

## 2024-04-20 NOTE — Telephone Encounter (Signed)
 FYI Only or Action Required?: FYI only for provider: appointment scheduled on this morning.  Patient was last seen in primary care on 04/13/2024 by Jerrell Cleatus Ned, MD.  Called Nurse Triage reporting Otalgia.  Symptoms began a week ago.  Interventions attempted: Other: seen in office.  Symptoms are: gradually worsening.  Triage Disposition: pt was told to rtc if symptoms did not resolve Patient/caregiver understands and will follow disposition?: yes              Copied from CRM #[id number]. Topic: Clinical - Red Word Triage >> Apr 20, 2024  8:11 AM Berneda FALCON wrote: Red Word that prompted transfer to Nurse Triage: Patient came in last week for an appt and was dx with a right ear infection. She was told to come in if it is worse and now it is worse.  In addition to ear ache, she is having a cough, sore throat, runny nose, possible left ear ache, no fever, actually low temp (96) Answer Assessment - Initial Assessment Questions 1. LOCATION: Which ear is involved?     Right ear pain and a little left ear pressure as well 2. ONSET: When did the ear pain start?      Last week 3. SEVERITY: How bad is the pain?  (Scale 1-10; mild, moderate or severe)     Right  - pressure, 2-4/10. Left 1-2/10 4. URI SYMPTOMS: Do you have a runny nose or cough?     yes 5. FEVER: Do you have a fever? If Yes, ask: What is your temperature, how was it measured, and when did it start?     no 6. CAUSE: Have you been swimming recently?, How often do you use Q-TIPS?, Have you had any recent air travel or scuba diving?     no 7. OTHER SYMPTOMS: Do you have any other symptoms? (e.g., decreased hearing, dizziness, headache, stiff neck, vomiting)     Eyes watering, post nasal drip, voice changes  Protocols used: Earache-A-AH

## 2024-04-20 NOTE — Patient Instructions (Signed)
 Although your exam is overall reassuring, with the persistent symptoms, possibly worsening symptoms and the timing of your symptoms I think would be reasonable to start an antibiotic at this point.  I will send in the azithromycin  which you have taken previously.  Mucinex  is fine over-the-counter as needed for cough, saline nasal spray, Flonase  if needed.  I did send in some codeine  cough syrup if needed at night.  If you have worsening symptoms including shortness of breath, fevers, or any progression of symptoms into next week, follow-up and we can decide on imaging or further testing.  I expect you to improve soon.  Hang in there!

## 2024-05-02 ENCOUNTER — Ambulatory Visit: Admitting: Internal Medicine

## 2024-05-12 ENCOUNTER — Other Ambulatory Visit: Payer: Self-pay | Admitting: Acute Care

## 2024-05-12 ENCOUNTER — Other Ambulatory Visit (HOSPITAL_BASED_OUTPATIENT_CLINIC_OR_DEPARTMENT_OTHER): Payer: Self-pay

## 2024-05-12 MED ORDER — ALBUTEROL SULFATE HFA 108 (90 BASE) MCG/ACT IN AERS
1.0000 | INHALATION_SPRAY | Freq: Four times a day (QID) | RESPIRATORY_TRACT | 2 refills | Status: AC | PRN
  Filled 2024-05-12: qty 6.7, 25d supply, fill #0

## 2024-05-17 ENCOUNTER — Encounter: Admitting: Family Medicine

## 2024-05-17 DIAGNOSIS — M47816 Spondylosis without myelopathy or radiculopathy, lumbar region: Secondary | ICD-10-CM

## 2024-05-17 DIAGNOSIS — M5030 Other cervical disc degeneration, unspecified cervical region: Secondary | ICD-10-CM

## 2024-05-17 DIAGNOSIS — R5383 Other fatigue: Secondary | ICD-10-CM

## 2024-05-17 DIAGNOSIS — M19042 Primary osteoarthritis, left hand: Secondary | ICD-10-CM

## 2024-05-17 DIAGNOSIS — M3500 Sicca syndrome, unspecified: Secondary | ICD-10-CM

## 2024-05-17 DIAGNOSIS — E785 Hyperlipidemia, unspecified: Secondary | ICD-10-CM

## 2024-05-17 DIAGNOSIS — M19071 Primary osteoarthritis, right ankle and foot: Secondary | ICD-10-CM

## 2024-05-17 DIAGNOSIS — M51369 Other intervertebral disc degeneration, lumbar region without mention of lumbar back pain or lower extremity pain: Secondary | ICD-10-CM

## 2024-05-17 DIAGNOSIS — R7689 Other specified abnormal immunological findings in serum: Secondary | ICD-10-CM

## 2024-05-17 DIAGNOSIS — Z0000 Encounter for general adult medical examination without abnormal findings: Secondary | ICD-10-CM

## 2024-05-17 DIAGNOSIS — M7061 Trochanteric bursitis, right hip: Secondary | ICD-10-CM

## 2024-05-17 DIAGNOSIS — M797 Fibromyalgia: Secondary | ICD-10-CM

## 2024-05-17 DIAGNOSIS — J309 Allergic rhinitis, unspecified: Secondary | ICD-10-CM

## 2024-05-31 ENCOUNTER — Ambulatory Visit: Admitting: Acute Care

## 2024-06-02 ENCOUNTER — Ambulatory Visit: Admitting: Rheumatology

## 2024-06-09 ENCOUNTER — Ambulatory Visit: Admitting: Internal Medicine
